# Patient Record
Sex: Female | Born: 1985 | Race: White | Hispanic: No | Marital: Single | Smoking: Current every day smoker
Health system: Southern US, Community
[De-identification: ages and names within clinical notes are randomized; demographics above are authoritative.]

## PROBLEM LIST (undated history)

## (undated) ENCOUNTER — Inpatient Hospital Stay (HOSPITAL_COMMUNITY): Payer: Self-pay

## (undated) DIAGNOSIS — F329 Major depressive disorder, single episode, unspecified: Secondary | ICD-10-CM

## (undated) DIAGNOSIS — L309 Dermatitis, unspecified: Secondary | ICD-10-CM

## (undated) DIAGNOSIS — F32A Depression, unspecified: Secondary | ICD-10-CM

## (undated) DIAGNOSIS — F1120 Opioid dependence, uncomplicated: Secondary | ICD-10-CM

## (undated) DIAGNOSIS — R87629 Unspecified abnormal cytological findings in specimens from vagina: Secondary | ICD-10-CM

## (undated) DIAGNOSIS — O139 Gestational [pregnancy-induced] hypertension without significant proteinuria, unspecified trimester: Secondary | ICD-10-CM

## (undated) DIAGNOSIS — Z22322 Carrier or suspected carrier of Methicillin resistant Staphylococcus aureus: Secondary | ICD-10-CM

## (undated) DIAGNOSIS — F419 Anxiety disorder, unspecified: Secondary | ICD-10-CM

## (undated) DIAGNOSIS — Z87891 Personal history of nicotine dependence: Secondary | ICD-10-CM

## (undated) DIAGNOSIS — Z87442 Personal history of urinary calculi: Secondary | ICD-10-CM

## (undated) DIAGNOSIS — F1290 Cannabis use, unspecified, uncomplicated: Secondary | ICD-10-CM

## (undated) DIAGNOSIS — Z8619 Personal history of other infectious and parasitic diseases: Secondary | ICD-10-CM

## (undated) DIAGNOSIS — O99320 Drug use complicating pregnancy, unspecified trimester: Secondary | ICD-10-CM

## (undated) HISTORY — DX: Dermatitis, unspecified: L30.9

## (undated) HISTORY — DX: Major depressive disorder, single episode, unspecified: F32.9

## (undated) HISTORY — DX: Carrier or suspected carrier of methicillin resistant Staphylococcus aureus: Z22.322

## (undated) HISTORY — PX: WISDOM TOOTH EXTRACTION: SHX21

## (undated) HISTORY — DX: Personal history of other infectious and parasitic diseases: Z86.19

## (undated) HISTORY — DX: Depression, unspecified: F32.A

---
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
## 2008-05-17 ENCOUNTER — Emergency Department (HOSPITAL_BASED_OUTPATIENT_CLINIC_OR_DEPARTMENT_OTHER): Admission: EM | Admit: 2008-05-17 | Discharge: 2008-05-17 | Payer: Self-pay | Admitting: Emergency Medicine

## 2008-10-19 ENCOUNTER — Emergency Department (HOSPITAL_BASED_OUTPATIENT_CLINIC_OR_DEPARTMENT_OTHER): Admission: EM | Admit: 2008-10-19 | Discharge: 2008-10-19 | Payer: Self-pay | Admitting: Emergency Medicine

## 2010-07-03 ENCOUNTER — Emergency Department (HOSPITAL_BASED_OUTPATIENT_CLINIC_OR_DEPARTMENT_OTHER)
Admission: EM | Admit: 2010-07-03 | Discharge: 2010-07-03 | Disposition: A | Discharge status: Home / Self Care | Payer: Self-pay | Attending: Emergency Medicine | Admitting: Emergency Medicine

## 2010-07-03 DIAGNOSIS — F17200 Nicotine dependence, unspecified, uncomplicated: Secondary | ICD-10-CM | POA: Insufficient documentation

## 2010-07-03 DIAGNOSIS — N390 Urinary tract infection, site not specified: Secondary | ICD-10-CM | POA: Insufficient documentation

## 2010-07-03 LAB — URINALYSIS, ROUTINE W REFLEX MICROSCOPIC
Nitrite: NEGATIVE
Specific Gravity, Urine: 1.023 (ref 1.005–1.030)
Urobilinogen, UA: 0.2 mg/dL (ref 0.0–1.0)

## 2010-07-03 LAB — URINE MICROSCOPIC-ADD ON

## 2010-07-03 LAB — PREGNANCY, URINE: Preg Test, Ur: NEGATIVE

## 2010-07-13 LAB — URINALYSIS, ROUTINE W REFLEX MICROSCOPIC
Bilirubin Urine: NEGATIVE
Glucose, UA: NEGATIVE mg/dL
Hgb urine dipstick: NEGATIVE
Ketones, ur: NEGATIVE mg/dL
pH: 7 (ref 5.0–8.0)

## 2010-07-13 LAB — URINE MICROSCOPIC-ADD ON

## 2010-07-22 LAB — URINALYSIS, ROUTINE W REFLEX MICROSCOPIC
Hgb urine dipstick: NEGATIVE
Ketones, ur: NEGATIVE mg/dL
Protein, ur: NEGATIVE mg/dL
Urobilinogen, UA: 1 mg/dL (ref 0.0–1.0)

## 2010-07-22 LAB — URINE MICROSCOPIC-ADD ON

## 2010-12-29 ENCOUNTER — Emergency Department (HOSPITAL_BASED_OUTPATIENT_CLINIC_OR_DEPARTMENT_OTHER)
Admission: EM | Admit: 2010-12-29 | Discharge: 2010-12-29 | Disposition: A | Discharge status: Home / Self Care | Payer: Self-pay | Attending: Emergency Medicine | Admitting: Emergency Medicine

## 2010-12-29 DIAGNOSIS — J069 Acute upper respiratory infection, unspecified: Secondary | ICD-10-CM | POA: Insufficient documentation

## 2010-12-29 DIAGNOSIS — F17200 Nicotine dependence, unspecified, uncomplicated: Secondary | ICD-10-CM | POA: Insufficient documentation

## 2010-12-29 NOTE — ED Provider Notes (Signed)
History    Scribed for No att. providers found, the patient was seen in room MHCT2/MHCT2. This chart was scribed by Katha Cabal. This patient's care was started at 3:42 PM.    CSN: [id number] Arrival date & time: 12/29/2010  3:41 PM  Chief Complaint  Patient presents with  . URI    HPI  (Consider location/radiation/quality/duration/timing/severity/associated sxs/prior treatment)  HPI Olivia Faulkner is a 25 y.o. female who presents to the Emergency Department complaining of gradual worsening of  persistent sore throat and sneezing that began 2 days ago with gradual onset of rhinorrhea, nasal congestion and sneezing, chills  that began yesterday.    Denies N/V/D, dysuria, fever, chest pain, abdominal pain and back pain.   Patient has been around her little brother who is sick also.  Patient works at [employer] and deals with customers often.  Patient has no had a regular menstrual cycle as she is on Depo Provera.   PAST MEDICAL HISTORY:  History reviewed. No pertinent past medical history.  PAST SURGICAL HISTORY:  History reviewed. No pertinent past surgical history.  FAMILY HISTORY:  No family history on file.   SOCIAL HISTORY: History   Social History  . Marital Status: Single    Spouse Name: N/A    Number of Children: N/A  . Years of Education: N/A   Social History Main Topics  . Smoking status: Current Some Day Smoker  . Smokeless tobacco: None  . Alcohol Use: No  . Drug Use: No  . Sexually Active: Yes    Birth Control/ Protection: Injection   Other Topics Concern  . None   Social History Narrative  . None     Review of Systems  Review of Systems 10 Systems reviewed and are negative for acute change except as noted in the HPI.  Allergies  Sulfa antibiotics  Home Medications  No current outpatient prescriptions on file.  Physical Exam    BP 111/68  Pulse 82  Temp 98.3 F (36.8 C)  Resp 16  Ht 5\' 2"  (1.575 m)  Wt 110 lb (49.896 kg)  BMI  20.12 kg/m2  SpO2 100%  Physical Exam  Nursing note and vitals reviewed. Constitutional: She is oriented to person, place, and time. She appears well-developed and well-nourished.  HENT:  Head: Normocephalic and atraumatic.  Right Ear: Tympanic membrane normal.  Left Ear: Tympanic membrane normal.  Mouth/Throat: Oropharynx is clear and moist and mucous membranes are normal. No oropharyngeal exudate, posterior oropharyngeal edema or posterior oropharyngeal erythema.  Eyes: EOM are normal. Pupils are equal, round, and reactive to light.  Neck: Neck supple.  Cardiovascular: Normal rate, regular rhythm and normal heart sounds.   No murmur heard. Pulmonary/Chest: Effort normal and breath sounds normal. No respiratory distress. She has no wheezes. She has no rales.  Abdominal: Soft. There is no tenderness.  Musculoskeletal: Normal range of motion.       No extremity deformity.    Neurological: She is alert and oriented to person, place, and time. No sensory deficit.  Skin: Skin is warm and dry. No rash noted.  Psychiatric: She has a normal mood and affect. Her behavior is normal.    ED Course  Procedures (including critical care time) OTHER DATA REVIEWED: Nursing notes, vital signs, and past medical records reviewed.   DIAGNOSTIC STUDIES: Oxygen Saturation is 100% on room air, normal by my interpretation.     LABS / RADIOLOGY:   No results found.    ED COURSE / COORDINATION  OF CARE: 3:49 PM  Physical Exam complete.   No orders of the defined types were placed in this encounter.    MDM: Throat exam was normal.  No sign of strep throat.  Discussed with patient the use of OTC decongestants and saline sprays for nasal congestion.  Patient requested a note for work as she missed work today.     IMPRESSION: Diagnoses that have been ruled out:  Diagnoses that are still under consideration:  Final diagnoses:  URI (upper respiratory infection)     MEDICATIONS GIVEN IN THE  E.D. Scheduled Meds:   Continuous Infusions:      DISCHARGE MEDICATIONS: New Prescriptions   No medications on file     I personally performed the services described in this documentation, which was scribed in my presence. The recorded information has been reviewed and considered.                 Hilario Quarry, MD 12/29/10 [phone]

## 2010-12-29 NOTE — ED Notes (Signed)
C/o sore thraot, runny nose, sneezing x 2 days-NAD

## 2011-04-07 ENCOUNTER — Emergency Department (HOSPITAL_BASED_OUTPATIENT_CLINIC_OR_DEPARTMENT_OTHER)
Admission: EM | Admit: 2011-04-07 | Discharge: 2011-04-07 | Disposition: A | Discharge status: Home / Self Care | Payer: Self-pay | Attending: Emergency Medicine | Admitting: Emergency Medicine

## 2011-04-07 ENCOUNTER — Encounter (HOSPITAL_BASED_OUTPATIENT_CLINIC_OR_DEPARTMENT_OTHER): Payer: Self-pay | Admitting: Emergency Medicine

## 2011-04-07 DIAGNOSIS — J020 Streptococcal pharyngitis: Secondary | ICD-10-CM | POA: Insufficient documentation

## 2011-04-07 DIAGNOSIS — F17200 Nicotine dependence, unspecified, uncomplicated: Secondary | ICD-10-CM | POA: Insufficient documentation

## 2011-04-07 LAB — RAPID STREP SCREEN (MED CTR MEBANE ONLY): Streptococcus, Group A Screen (Direct): POSITIVE — AB

## 2011-04-07 MED ORDER — SODIUM CHLORIDE 0.9 % IV BOLUS (SEPSIS)
1000.0000 mL | Freq: Once | INTRAVENOUS | Status: AC
  Administered 2011-04-07: 1000 mL via INTRAVENOUS

## 2011-04-07 MED ORDER — IBUPROFEN 400 MG PO TABS
600.0000 mg | ORAL_TABLET | Freq: Once | ORAL | Status: AC
  Administered 2011-04-07: 600 mg via ORAL
  Filled 2011-04-07: qty 1

## 2011-04-07 MED ORDER — AMOXICILLIN 500 MG PO CAPS: 500.0000 mg | ORAL_CAPSULE | Freq: Three times a day (TID) | ORAL | Status: AC

## 2011-04-07 NOTE — ED Notes (Signed)
Sore throat, fever, runny nose, some nausea, no V/D.  No cough, some sob.

## 2011-04-07 NOTE — ED Provider Notes (Signed)
Medical screening examination/treatment/procedure(s) were performed by non-physician practitioner and as supervising physician I was immediately available for consultation/collaboration.     Forbes Cellar, MD 04/07/11 1622

## 2011-04-07 NOTE — ED Provider Notes (Signed)
History     CSN: [id number]  Arrival date & time 04/07/11  1349   First MD Initiated Contact with Patient 04/07/11 1445      Chief Complaint  Patient presents with  . Sore Throat  . Fever    (Consider location/radiation/quality/duration/timing/severity/associated sxs/prior treatment) Patient is a 26 y.o. female presenting with pharyngitis. The history is provided by the patient. No language interpreter was used.  Sore Throat This is a new problem. The current episode started in the past 7 days. The problem occurs constantly. Associated symptoms include a fever. The symptoms are aggravated by swallowing. She has tried nothing for the symptoms.    History reviewed. No pertinent past medical history.  History reviewed. No pertinent past surgical history.  History reviewed. No pertinent family history.  History  Substance Use Topics  . Smoking status: Current Some Day Smoker  . Smokeless tobacco: Not on file  . Alcohol Use: No    OB History    Grav Para Term Preterm Abortions TAB SAB Ect Mult Living                  Review of Systems  Constitutional: Positive for fever.  HENT: Positive for rhinorrhea.   All other systems reviewed and are negative.    Allergies  Sulfa antibiotics  Home Medications   Current Outpatient Rx  Name Route Sig Dispense Refill  . MEDROXYPROGESTERONE ACETATE 150 MG/ML IM SUSP Intramuscular Inject 150 mg into the muscle every 3 (three) months.        BP 110/73  Pulse 137  Temp(Src) 101.4 F (38.6 C) (Oral)  Resp 24  Ht 5\' 2"  (1.575 m)  Wt 110 lb (49.896 kg)  BMI 20.12 kg/m2  SpO2 100%  Physical Exam  Nursing note and vitals reviewed. Constitutional: She is oriented to person, place, and time. She appears well-developed and well-nourished.  HENT:  Head: Normocephalic and atraumatic.  Right Ear: External ear normal.  Left Ear: External ear normal.  Mouth/Throat: Oropharyngeal exudate present.  Eyes: Pupils are equal, round,  and reactive to light.  Neck: Normal range of motion. Neck supple.  Cardiovascular: Normal rate and regular rhythm.   Pulmonary/Chest: Effort normal and breath sounds normal.  Musculoskeletal: Normal range of motion.  Neurological: She is alert and oriented to person, place, and time.    ED Course  Procedures (including critical care time)  Labs Reviewed  RAPID STREP SCREEN - Abnormal; Notable for the following:    Streptococcus, Group A Screen (Direct) POSITIVE (*)    All other components within normal limits   No results found.   1. Strep pharyngitis       MDM  Pt requesting oral abx instead of shot:pt vital stable        Teressa Lower, NP 04/07/11 1620

## 2011-04-10 ENCOUNTER — Ambulatory Visit: Payer: Self-pay | Admitting: Internal Medicine

## 2011-04-16 ENCOUNTER — Ambulatory Visit (INDEPENDENT_AMBULATORY_CARE_PROVIDER_SITE_OTHER): Payer: BC Managed Care – PPO | Admitting: Internal Medicine

## 2011-04-16 ENCOUNTER — Encounter: Payer: Self-pay | Admitting: Internal Medicine

## 2011-04-16 VITALS — BP 100/72 | HR 89 | Temp 98.2°F | Resp 18 | Ht 62.5 in | Wt 94.0 lb

## 2011-04-16 DIAGNOSIS — F419 Anxiety disorder, unspecified: Secondary | ICD-10-CM | POA: Insufficient documentation

## 2011-04-16 DIAGNOSIS — F329 Major depressive disorder, single episode, unspecified: Secondary | ICD-10-CM

## 2011-04-16 DIAGNOSIS — F341 Dysthymic disorder: Secondary | ICD-10-CM

## 2011-04-16 DIAGNOSIS — F411 Generalized anxiety disorder: Secondary | ICD-10-CM | POA: Insufficient documentation

## 2011-04-16 DIAGNOSIS — J020 Streptococcal pharyngitis: Secondary | ICD-10-CM | POA: Insufficient documentation

## 2011-04-16 DIAGNOSIS — F32A Depression, unspecified: Secondary | ICD-10-CM

## 2011-04-16 DIAGNOSIS — R634 Abnormal weight loss: Secondary | ICD-10-CM

## 2011-04-16 HISTORY — PX: NO PAST SURGERIES: SHX2092

## 2011-04-16 LAB — HEPATIC FUNCTION PANEL
ALT: 15 U/L (ref 0–35)
AST: 15 U/L (ref 0–37)
Albumin: 5.2 g/dL (ref 3.5–5.2)
Alkaline Phosphatase: 48 U/L (ref 39–117)
Total Bilirubin: 1 mg/dL (ref 0.3–1.2)
Total Protein: 7.4 g/dL (ref 6.0–8.3)

## 2011-04-16 LAB — CBC WITH DIFFERENTIAL/PLATELET
Basophils Relative: 0 % (ref 0–1)
HCT: 44.3 % (ref 36.0–46.0)
Hemoglobin: 14.9 g/dL (ref 12.0–15.0)
Lymphs Abs: 2.6 10*3/uL (ref 0.7–4.0)
MCHC: 33.6 g/dL (ref 30.0–36.0)
Monocytes Absolute: 0.7 10*3/uL (ref 0.1–1.0)
Monocytes Relative: 6 % (ref 3–12)
Neutro Abs: 9.3 10*3/uL — ABNORMAL HIGH (ref 1.7–7.7)
RBC: 4.81 MIL/uL (ref 3.87–5.11)

## 2011-04-16 LAB — BASIC METABOLIC PANEL
BUN: 13 mg/dL (ref 6–23)
CO2: 26 mEq/L (ref 19–32)
Chloride: 105 mEq/L (ref 96–112)
Glucose, Bld: 84 mg/dL (ref 70–99)
Potassium: 4.8 mEq/L (ref 3.5–5.3)
Sodium: 143 mEq/L (ref 135–145)

## 2011-04-16 LAB — TSH: TSH: 1.918 u[IU]/mL (ref 0.350–4.500)

## 2011-04-16 MED ORDER — CITALOPRAM HYDROBROMIDE 10 MG PO TABS: ORAL_TABLET | ORAL | Status: DC

## 2011-04-16 NOTE — Progress Notes (Signed)
  Subjective:    Patient ID: Olivia Faulkner, female    DOB: Oct 13, 1985, 26 y.o.   MRN: [id number]  HPI Pt presents to clinic for evaluation of anxiety. Notes several year h/o anxiety with intermittent panic attacks. Has mild accompanying depression and insomnia recently going to sleep ~3am and awakening at 8am. Feels like her mind races at night. Recently anxiety worse with life stressors including current relationship with boyfriend. Panic attacks occuring 3x/week sometimes without trigger. Has never undergone medical treatment or counseling. Has lost weight and finds it difficult to gain weight in general. Recently tx'ed at ED 04/07/11 for ST with dx of strep pharyngitis. Two days remaining of amox and sx's significantly improved. [facility] depo provera injxn. Missed last appt but plans to call and follow up. No other complaints.   Past Medical History  Diagnosis Date  . Asthma   . History of chicken pox   . Depression   . Allergic rhinitis   . Migraine    Past Surgical History  Procedure Date  . No past surgeries 04/16/2011    reports that she has been smoking.  She has never used smokeless tobacco. She reports that she drinks alcohol. She reports that she does not use illicit drugs. family history includes Alcohol abuse in her maternal grandfather; Diabetes in her father; Heart disease in her maternal grandmother; Hypertension in her mother; Kidney disease in her maternal grandmother; and Lung cancer in her mother. Allergies  Allergen Reactions  . Sulfa Antibiotics      Review of Systems  Constitutional: Positive for unexpected weight change.  Cardiovascular: Negative for palpitations.  Neurological: Positive for headaches.  Psychiatric/Behavioral: Positive for sleep disturbance and dysphoric mood. The patient is nervous/anxious.   All other systems reviewed and are negative.       Objective:   Physical Exam  Nursing note and vitals  reviewed. Constitutional: She appears well-developed and well-nourished. No distress.  HENT:  Head: Normocephalic and atraumatic.  Right Ear: External ear normal.  Left Ear: External ear normal.  Nose: Nose normal.  Eyes: Conjunctivae are normal. Pupils are equal, round, and reactive to light. No scleral icterus.  Neck: Neck supple. No thyromegaly present.  Cardiovascular: Normal rate, regular rhythm and normal heart sounds.  Exam reveals no gallop and no friction rub.   No murmur heard. Pulmonary/Chest: Effort normal and breath sounds normal. No respiratory distress. She has no wheezes. She has no rales.  Neurological: She is alert.  Skin: Skin is warm and dry. She is not diaphoretic.  Psychiatric: She has a normal mood and affect. Her speech is normal and behavior is normal. Judgment and thought content normal. Her mood appears not anxious. Cognition and memory are normal.          Assessment & Plan:

## 2011-04-16 NOTE — Assessment & Plan Note (Signed)
Improving. Continue abx to completion

## 2011-04-16 NOTE — Assessment & Plan Note (Signed)
Recommend begin low dose ssri with close followup. Obtain TSH. Offered counselor/therapist referral- believes relationship stressor may improve in the near future. Suspect insomnia is secondary.

## 2011-04-16 NOTE — Assessment & Plan Note (Signed)
Obtain tsh, cbc, chem7, lft.

## 2011-05-19 ENCOUNTER — Telehealth: Payer: Self-pay | Admitting: Internal Medicine

## 2011-05-19 ENCOUNTER — Telehealth: Payer: Self-pay | Admitting: *Deleted

## 2011-05-19 ENCOUNTER — Ambulatory Visit (INDEPENDENT_AMBULATORY_CARE_PROVIDER_SITE_OTHER): Payer: BC Managed Care – PPO | Admitting: Internal Medicine

## 2011-05-19 ENCOUNTER — Encounter: Payer: Self-pay | Admitting: Internal Medicine

## 2011-05-19 VITALS — BP 90/64 | HR 89 | Temp 97.9°F | Resp 16 | Ht 62.5 in | Wt 97.0 lb

## 2011-05-19 DIAGNOSIS — J40 Bronchitis, not specified as acute or chronic: Secondary | ICD-10-CM

## 2011-05-19 MED ORDER — DOXYCYCLINE HYCLATE 100 MG PO TABS: 100.0000 mg | ORAL_TABLET | Freq: Two times a day (BID) | ORAL | Status: AC

## 2011-05-19 MED ORDER — AZITHROMYCIN 250 MG PO TABS: ORAL_TABLET | ORAL | Status: DC

## 2011-05-19 NOTE — Progress Notes (Signed)
  Subjective:    Patient ID: Olivia Faulkner, female    DOB: [birth year], 26 y.o.   MRN: [id number]  HPI Pt presents to clinic for evaluation of cough. Notes 1wk h/o cough productive for green sputum without blood, dyspnea or wheeze. Taking otc medication without improvement. No other alleviating or exacerbating factors. No other complaints.  Past Medical History  Diagnosis Date  . Asthma   . History of chicken pox   . Depression   . Allergic rhinitis   . Migraine    Past Surgical History  Procedure Date  . No past surgeries 04/16/2011    reports that she has been smoking.  She has never used smokeless tobacco. She reports that she drinks alcohol. She reports that she does not use illicit drugs. family history includes Alcohol abuse in her maternal grandfather; Diabetes in her father; Heart disease in her maternal grandmother; Hypertension in her mother; Kidney disease in her maternal grandmother; and Lung cancer in her mother. Allergies  Allergen Reactions  . Sulfa Antibiotics      Review of Systems see hpi     Objective:   Physical Exam  Nursing note and vitals reviewed. Constitutional: She appears well-developed and well-nourished. No distress.  HENT:  Head: Normocephalic and atraumatic.  Right Ear: External ear normal.  Left Ear: External ear normal.  Nose: Nose normal.  Mouth/Throat: Oropharynx is clear and moist. No oropharyngeal exudate.  Eyes: Conjunctivae are normal. No scleral icterus.  Neck: Neck supple.  Cardiovascular: Normal rate, regular rhythm and normal heart sounds.   Pulmonary/Chest: Effort normal and breath sounds normal. No respiratory distress. She has no wheezes. She has no rales.  Lymphadenopathy:    She has no cervical adenopathy.  Neurological: She is alert.  Skin: Skin is warm and dry. She is not diaphoretic.  Psychiatric: She has a normal mood and affect.          Assessment & Plan:

## 2011-05-19 NOTE — Telephone Encounter (Signed)
Fax received form [facility] pharmacy stating a interaction with Citalopram and Azithromycin causing  Qt prolongation.  Medication changed to Doxycycline 100 BID  x 7 days per Dr Rodena Medin instructions.

## 2011-05-19 NOTE — Telephone Encounter (Signed)
Patient was seen in the office today.Rx sent electronically to pharmacy.

## 2011-05-24 DIAGNOSIS — J40 Bronchitis, not specified as acute or chronic: Secondary | ICD-10-CM | POA: Insufficient documentation

## 2011-05-24 NOTE — Assessment & Plan Note (Signed)
Attempt abx. Work note provided. Followup if no improvement or worsening.

## 2011-05-25 ENCOUNTER — Ambulatory Visit (INDEPENDENT_AMBULATORY_CARE_PROVIDER_SITE_OTHER): Payer: BC Managed Care – PPO | Admitting: Internal Medicine

## 2011-05-25 ENCOUNTER — Encounter: Payer: Self-pay | Admitting: Internal Medicine

## 2011-05-25 DIAGNOSIS — F419 Anxiety disorder, unspecified: Secondary | ICD-10-CM

## 2011-05-25 DIAGNOSIS — F329 Major depressive disorder, single episode, unspecified: Secondary | ICD-10-CM

## 2011-05-25 DIAGNOSIS — J40 Bronchitis, not specified as acute or chronic: Secondary | ICD-10-CM

## 2011-05-25 DIAGNOSIS — F341 Dysthymic disorder: Secondary | ICD-10-CM

## 2011-05-25 MED ORDER — CITALOPRAM HYDROBROMIDE 20 MG PO TABS: 20.0000 mg | ORAL_TABLET | Freq: Every day | ORAL | Status: DC

## 2011-05-25 NOTE — Assessment & Plan Note (Signed)
Improving. Increase celexa 20mg  po qd. Consider otc valerian or melatonin for sleep

## 2011-05-25 NOTE — Progress Notes (Signed)
  Subjective:    Patient ID: Olivia Faulkner, female    DOB: Aug 20, 1985, 26 y.o.   MRN: [id number]  HPI Pt presents to clinic for followup of multiple medical problems. Bronchitis resolving with a few days of abx's left. Tolerating celexa 10mg  po qd without side effect. Notes less panic attacks- reduced to 1x/wk instead of 3x/wk. Does still have insomnia described as difficulty sustaining sleep. Has attempted otc sleep medication with mixed results. No other complaints.   Past Medical History  Diagnosis Date  . Asthma   . History of chicken pox   . Depression   . Allergic rhinitis   . Migraine    Past Surgical History  Procedure Date  . No past surgeries 04/16/2011    reports that she has been smoking.  She has never used smokeless tobacco. She reports that she drinks alcohol. She reports that she does not use illicit drugs. family history includes Alcohol abuse in her maternal grandfather; Diabetes in her father; Heart disease in her maternal grandmother; Hypertension in her mother; Kidney disease in her maternal grandmother; and Lung cancer in her mother. Allergies  Allergen Reactions  . Sulfa Antibiotics       Review of Systems see hpi     Objective:   Physical Exam  Nursing note and vitals reviewed. Constitutional: She appears well-developed and well-nourished. No distress.  HENT:  Head: Normocephalic and atraumatic.  Neurological: She is alert.  Skin: She is not diaphoretic.  Psychiatric: She has a normal mood and affect.          Assessment & Plan:

## 2011-05-25 NOTE — Patient Instructions (Signed)
You can try over the counter valerian or melatonin for sleep

## 2011-05-25 NOTE — Assessment & Plan Note (Signed)
Resolving. Take abx to completion

## 2012-01-04 ENCOUNTER — Emergency Department (HOSPITAL_BASED_OUTPATIENT_CLINIC_OR_DEPARTMENT_OTHER)
Admission: EM | Admit: 2012-01-04 | Discharge: 2012-01-04 | Disposition: A | Discharge status: Home / Self Care | Payer: Self-pay | Attending: Emergency Medicine | Admitting: Emergency Medicine

## 2012-01-04 ENCOUNTER — Encounter (HOSPITAL_BASED_OUTPATIENT_CLINIC_OR_DEPARTMENT_OTHER): Payer: Self-pay | Admitting: *Deleted

## 2012-01-04 DIAGNOSIS — Z0389 Encounter for observation for other suspected diseases and conditions ruled out: Secondary | ICD-10-CM | POA: Insufficient documentation

## 2012-01-04 NOTE — ED Notes (Signed)
No answer in the waiting room when pt was called to tx room

## 2012-01-04 NOTE — ED Notes (Signed)
No answer in the waiting room when pt was called to tx room x 2.

## 2012-01-04 NOTE — ED Notes (Addendum)
Migraine headache since this am. Slight fever. States she needs a work note before she can come back to work.

## 2012-01-05 ENCOUNTER — Encounter (HOSPITAL_BASED_OUTPATIENT_CLINIC_OR_DEPARTMENT_OTHER): Payer: Self-pay | Admitting: *Deleted

## 2012-01-05 ENCOUNTER — Emergency Department (HOSPITAL_BASED_OUTPATIENT_CLINIC_OR_DEPARTMENT_OTHER)
Admission: EM | Admit: 2012-01-05 | Discharge: 2012-01-05 | Disposition: A | Discharge status: Home / Self Care | Payer: BC Managed Care – PPO | Attending: Emergency Medicine | Admitting: Emergency Medicine

## 2012-01-05 DIAGNOSIS — F3289 Other specified depressive episodes: Secondary | ICD-10-CM | POA: Insufficient documentation

## 2012-01-05 DIAGNOSIS — Z882 Allergy status to sulfonamides status: Secondary | ICD-10-CM | POA: Insufficient documentation

## 2012-01-05 DIAGNOSIS — F329 Major depressive disorder, single episode, unspecified: Secondary | ICD-10-CM | POA: Insufficient documentation

## 2012-01-05 DIAGNOSIS — F17200 Nicotine dependence, unspecified, uncomplicated: Secondary | ICD-10-CM | POA: Insufficient documentation

## 2012-01-05 DIAGNOSIS — Z79899 Other long term (current) drug therapy: Secondary | ICD-10-CM | POA: Insufficient documentation

## 2012-01-05 DIAGNOSIS — R51 Headache: Secondary | ICD-10-CM | POA: Insufficient documentation

## 2012-01-05 MED ORDER — ISOMETHEPTENE-APAP-DICHLORAL 65-325-100 MG PO CAPS: 1.0000 | ORAL_CAPSULE | Freq: Four times a day (QID) | ORAL | Status: DC | PRN

## 2012-01-05 NOTE — ED Provider Notes (Signed)
History     CSN: [id number]  Arrival date & time 01/05/12  1130   First MD Initiated Contact with Patient 01/05/12 1223      Chief Complaint  Patient presents with  . Headache    (Consider location/radiation/quality/duration/timing/severity/associated sxs/prior treatment) Patient is a 26 y.o. female presenting with headaches. The history is provided by the patient. No language interpreter was used.  Headache  This is a new problem. The current episode started yesterday. The problem occurs constantly. The headache is associated with nothing. The quality of the pain is described as sharp. The pain is at a severity of 4/10. The pain is moderate. She has tried acetaminophen for the symptoms. The treatment provided no relief.  Pt reports she does not want a shot.   Pt requesting rx for something for migranes.   Pt reports she has had similar in the past.  Past Medical History  Diagnosis Date  . Asthma   . History of chicken pox   . Depression   . Allergic rhinitis   . Migraine     Past Surgical History  Procedure Date  . No past surgeries 04/16/2011    Family History  Problem Relation Age of Onset  . Alcohol abuse Maternal Grandfather   . Lung cancer Mother   . Heart disease Maternal Grandmother     paternal grandparents  . Hypertension Mother     maternal grandmother  . Kidney disease Maternal Grandmother   . Diabetes Father     History  Substance Use Topics  . Smoking status: Current Every Day Smoker  . Smokeless tobacco: Never Used  . Alcohol Use: Yes    OB History    Grav Para Term Preterm Abortions TAB SAB Ect Mult Living                  Review of Systems  Neurological: Positive for headaches.  All other systems reviewed and are negative.    Allergies  Sulfa antibiotics  Home Medications   Current Outpatient Rx  Name Route Sig Dispense Refill  . CITALOPRAM HYDROBROMIDE 10 MG PO TABS  One half by mouth once a day for 14 days then one a day 30  tablet 6  . CITALOPRAM HYDROBROMIDE 20 MG PO TABS Oral Take 1 tablet (20 mg total) by mouth daily. 30 tablet 6  . ISOMETHEPTENE-APAP-DICHLORAL 65-325-100 MG PO CAPS Oral Take 1 capsule by mouth 4 (four) times daily as needed. 30 capsule 0    BP 118/82  Pulse 82  Temp 98 F (36.7 C) (Oral)  Resp 18  Ht 5\' 2"  (1.575 m)  Wt 100 lb (45.36 kg)  BMI 18.29 kg/m2  SpO2 100%  LMP 12/31/2011  Physical Exam  Nursing note and vitals reviewed. Constitutional: She is oriented to person, place, and time. She appears well-developed and well-nourished.  HENT:  Head: Normocephalic and atraumatic.  Right Ear: External ear normal.  Left Ear: External ear normal.  Nose: Nose normal.  Mouth/Throat: Oropharynx is clear and moist.  Eyes: Conjunctivae normal and EOM are normal. Pupils are equal, round, and reactive to light.  Neck: Normal range of motion. Neck supple.  Cardiovascular: Normal rate and normal heart sounds.   Pulmonary/Chest: Effort normal.  Abdominal: Soft.  Musculoskeletal: Normal range of motion.  Neurological: She is alert and oriented to person, place, and time. She has normal reflexes.  Skin: Skin is warm.  Psychiatric: She has a normal mood and affect.    ED Course  Procedures (including critical care time)  Labs Reviewed - No data to display No results found.   1. Headache       MDM  midrin rx changed to fiorcet pt unable to afford midrin       Elson Areas, [place] 01/05/12 1724

## 2012-01-05 NOTE — ED Notes (Signed)
Pt amb to room 10 with quick steady gait smiling in nad. Pt reports ha x Sunday, states she has had similar ha in the past, and was to have head ct and consult neurologist but her insurance ran out and she could not afford these tests. Denies any other sx, rates ha at 7/10.

## 2012-01-06 NOTE — ED Provider Notes (Signed)
History/physical exam/procedure(s) were performed by non-physician practitioner and as supervising physician I was immediately available for consultation/collaboration. I have reviewed all notes and am in agreement with care and plan.   Hilario Quarry, MD 01/06/12 [phone]

## 2012-04-19 ENCOUNTER — Encounter (HOSPITAL_BASED_OUTPATIENT_CLINIC_OR_DEPARTMENT_OTHER): Payer: Self-pay

## 2012-04-19 ENCOUNTER — Emergency Department (HOSPITAL_BASED_OUTPATIENT_CLINIC_OR_DEPARTMENT_OTHER)
Admission: EM | Admit: 2012-04-19 | Discharge: 2012-04-19 | Disposition: A | Discharge status: Home / Self Care | Payer: Self-pay | Attending: Emergency Medicine | Admitting: Emergency Medicine

## 2012-04-19 DIAGNOSIS — J45909 Unspecified asthma, uncomplicated: Secondary | ICD-10-CM | POA: Insufficient documentation

## 2012-04-19 DIAGNOSIS — Z8679 Personal history of other diseases of the circulatory system: Secondary | ICD-10-CM | POA: Insufficient documentation

## 2012-04-19 DIAGNOSIS — J029 Acute pharyngitis, unspecified: Secondary | ICD-10-CM | POA: Insufficient documentation

## 2012-04-19 DIAGNOSIS — Z8619 Personal history of other infectious and parasitic diseases: Secondary | ICD-10-CM | POA: Insufficient documentation

## 2012-04-19 DIAGNOSIS — F17200 Nicotine dependence, unspecified, uncomplicated: Secondary | ICD-10-CM | POA: Insufficient documentation

## 2012-04-19 DIAGNOSIS — Z8659 Personal history of other mental and behavioral disorders: Secondary | ICD-10-CM | POA: Insufficient documentation

## 2012-04-19 DIAGNOSIS — R509 Fever, unspecified: Secondary | ICD-10-CM | POA: Insufficient documentation

## 2012-04-19 DIAGNOSIS — Z79899 Other long term (current) drug therapy: Secondary | ICD-10-CM | POA: Insufficient documentation

## 2012-04-19 DIAGNOSIS — Z8709 Personal history of other diseases of the respiratory system: Secondary | ICD-10-CM | POA: Insufficient documentation

## 2012-04-19 MED ORDER — DEXAMETHASONE SODIUM PHOSPHATE 10 MG/ML IJ SOLN
10.0000 mg | Freq: Once | INTRAMUSCULAR | Status: AC
  Administered 2012-04-19: 10 mg via INTRAMUSCULAR
  Filled 2012-04-19: qty 1

## 2012-04-19 NOTE — ED Provider Notes (Signed)
Medical screening examination/treatment/procedure(s) were performed by non-physician practitioner and as supervising physician I was immediately available for consultation/collaboration.   Rolan Bucco, MD 04/19/12 1535

## 2012-04-19 NOTE — ED Provider Notes (Signed)
History     CSN: [id number]  Arrival date & time 04/19/12  1435   First MD Initiated Contact with Patient 04/19/12 1444      Chief Complaint  Patient presents with  . Sore Throat  . Fever    (Consider location/radiation/quality/duration/timing/severity/associated sxs/prior treatment) Patient is a 27 y.o. female presenting with pharyngitis. The history is provided by the patient. No language interpreter was used.  Sore Throat This is a new problem. The current episode started yesterday. The problem occurs constantly. Associated symptoms include a fever and a sore throat. Nothing aggravates the symptoms. She has tried nothing for the symptoms.    Past Medical History  Diagnosis Date  . Asthma   . History of chicken pox   . Depression   . Allergic rhinitis   . Migraine     Past Surgical History  Procedure Date  . No past surgeries 04/16/2011    Family History  Problem Relation Age of Onset  . Alcohol abuse Maternal Grandfather   . Lung cancer Mother   . Heart disease Maternal Grandmother     paternal grandparents  . Hypertension Mother     maternal grandmother  . Kidney disease Maternal Grandmother   . Diabetes Father     History  Substance Use Topics  . Smoking status: Current Every Day Smoker  . Smokeless tobacco: Never Used  . Alcohol Use: No    OB History    Grav Para Term Preterm Abortions TAB SAB Ect Mult Living                  Review of Systems  Constitutional: Positive for fever.  HENT: Positive for sore throat.   Respiratory: Negative.   Cardiovascular: Negative.     Allergies  Sulfa antibiotics  Home Medications   Current Outpatient Rx  Name  Route  Sig  Dispense  Refill  . ALBUTEROL IN   Inhalation   Inhale into the lungs.           BP 113/57  Pulse 75  Temp 98.9 F (37.2 C) (Oral)  Resp 16  Ht 5\' 2"  (1.575 m)  Wt 100 lb (45.36 kg)  BMI 18.29 kg/m2  SpO2 100%  LMP 03/19/2012  Physical Exam  Nursing note and vitals  reviewed. Constitutional: She appears well-developed and well-nourished.  HENT:  Head: Normocephalic and atraumatic.  Right Ear: External ear normal.  Left Ear: External ear normal.  Mouth/Throat: Posterior oropharyngeal erythema present.  Eyes: Conjunctivae normal are normal.  Cardiovascular: Normal rate and regular rhythm.   Pulmonary/Chest: Effort normal and breath sounds normal.  Neurological: She is alert.  Skin: Skin is warm and dry.    ED Course  Procedures (including critical care time)   Labs Reviewed  RAPID STREP SCREEN   No results found.   1. Pharyngitis       MDM  Pt given decadron for symptomatic relief       Teressa Lower, NP 04/19/12 [phone]

## 2012-04-19 NOTE — ED Notes (Signed)
Pt reports sore throat and fever that started yesterday.  

## 2012-05-10 ENCOUNTER — Encounter (HOSPITAL_BASED_OUTPATIENT_CLINIC_OR_DEPARTMENT_OTHER): Payer: Self-pay

## 2012-05-10 ENCOUNTER — Emergency Department (HOSPITAL_BASED_OUTPATIENT_CLINIC_OR_DEPARTMENT_OTHER)
Admission: EM | Admit: 2012-05-10 | Discharge: 2012-05-10 | Disposition: A | Discharge status: Home / Self Care | Payer: Self-pay | Attending: Emergency Medicine | Admitting: Emergency Medicine

## 2012-05-10 DIAGNOSIS — Z8619 Personal history of other infectious and parasitic diseases: Secondary | ICD-10-CM | POA: Insufficient documentation

## 2012-05-10 DIAGNOSIS — K0889 Other specified disorders of teeth and supporting structures: Secondary | ICD-10-CM

## 2012-05-10 DIAGNOSIS — Z79899 Other long term (current) drug therapy: Secondary | ICD-10-CM | POA: Insufficient documentation

## 2012-05-10 DIAGNOSIS — J45909 Unspecified asthma, uncomplicated: Secondary | ICD-10-CM | POA: Insufficient documentation

## 2012-05-10 DIAGNOSIS — F17200 Nicotine dependence, unspecified, uncomplicated: Secondary | ICD-10-CM | POA: Insufficient documentation

## 2012-05-10 DIAGNOSIS — J309 Allergic rhinitis, unspecified: Secondary | ICD-10-CM | POA: Insufficient documentation

## 2012-05-10 DIAGNOSIS — K089 Disorder of teeth and supporting structures, unspecified: Secondary | ICD-10-CM | POA: Insufficient documentation

## 2012-05-10 DIAGNOSIS — Z8679 Personal history of other diseases of the circulatory system: Secondary | ICD-10-CM | POA: Insufficient documentation

## 2012-05-10 DIAGNOSIS — Z8659 Personal history of other mental and behavioral disorders: Secondary | ICD-10-CM | POA: Insufficient documentation

## 2012-05-10 MED ORDER — HYDROCODONE-ACETAMINOPHEN 5-325 MG PO TABS
2.0000 | ORAL_TABLET | Freq: Once | ORAL | Status: AC
  Administered 2012-05-10: 2 via ORAL
  Filled 2012-05-10: qty 2

## 2012-05-10 MED ORDER — AMOXICILLIN 500 MG PO CAPS: 500.0000 mg | ORAL_CAPSULE | Freq: Three times a day (TID) | ORAL | Status: DC

## 2012-05-10 MED ORDER — HYDROCODONE-ACETAMINOPHEN 5-325 MG PO TABS: 2.0000 | ORAL_TABLET | ORAL | Status: AC | PRN

## 2012-05-10 NOTE — ED Notes (Signed)
Pt also given number for [employer].

## 2012-05-10 NOTE — ED Provider Notes (Signed)
History     CSN: [id number]  Arrival date & time 05/10/12  1826   First MD Initiated Contact with Patient 05/10/12 1932      Chief Complaint  Patient presents with  . Dental Pain    (Consider location/radiation/quality/duration/timing/severity/associated sxs/prior treatment) Patient is a 27 y.o. female presenting with tooth pain. The history is provided by the patient. No language interpreter was used.  Dental PainThe primary symptoms include mouth pain. The symptoms began more than 1 month ago. The symptoms are worsening. The symptoms occur constantly.  Additional symptoms include: gum swelling and gum tenderness. Medical issues include: smoking.    Past Medical History  Diagnosis Date  . Asthma   . History of chicken pox   . Depression   . Allergic rhinitis   . Migraine     Past Surgical History  Procedure Date  . No past surgeries 04/16/2011    Family History  Problem Relation Age of Onset  . Alcohol abuse Maternal Grandfather   . Lung cancer Mother   . Heart disease Maternal Grandmother     paternal grandparents  . Hypertension Mother     maternal grandmother  . Kidney disease Maternal Grandmother   . Diabetes Father     History  Substance Use Topics  . Smoking status: Current Every Day Smoker -- 0.5 packs/day    Types: Cigarettes  . Smokeless tobacco: Never Used  . Alcohol Use: No    OB History    Grav Para Term Preterm Abortions TAB SAB Ect Mult Living                  Review of Systems  HENT: Positive for dental problem.   All other systems reviewed and are negative.    Allergies  Sulfa antibiotics  Home Medications   Current Outpatient Rx  Name  Route  Sig  Dispense  Refill  . ALBUTEROL IN   Inhalation   Inhale into the lungs.           BP 106/56  Pulse 56  Temp 98.4 F (36.9 C) (Oral)  Resp 18  Ht 5\' 2"  (1.575 m)  Wt 105 lb (47.628 kg)  BMI 19.20 kg/m2  SpO2 100%  LMP 04/19/2012  Physical Exam  Nursing note and vitals  reviewed. Constitutional: She is oriented to person, place, and time. She appears well-developed and well-nourished.  HENT:  Head: Normocephalic and atraumatic.  Right Ear: External ear normal.  Left Ear: External ear normal.  Eyes: Pupils are equal, round, and reactive to light.  Neck: Normal range of motion.  Cardiovascular: Normal rate.   Pulmonary/Chest: Effort normal.  Musculoskeletal: Normal range of motion.  Neurological: She is alert and oriented to person, place, and time. She has normal reflexes.  Skin: Skin is warm.  Psychiatric: She has a normal mood and affect.    ED Course  Procedures (including critical care time)  Labs Reviewed - No data to display No results found.   No diagnosis found.    MDM  pcn vk and hydrocodone.  Pt advised to call dr. Lucky Cowboy to be seen for evaluation        Lonia Skinner [place], [place] 05/10/12 [id number]

## 2012-05-10 NOTE — ED Notes (Signed)
Pt rpeorts dental pain x 1 month unrelieved after taking Ibuprofen.

## 2012-05-10 NOTE — ED Provider Notes (Signed)
Medical screening examination/treatment/procedure(s) were performed by non-physician practitioner and as supervising physician I was immediately available for consultation/collaboration.    Celene Kras, MD 05/10/12 [phone]

## 2013-02-13 ENCOUNTER — Encounter (HOSPITAL_BASED_OUTPATIENT_CLINIC_OR_DEPARTMENT_OTHER): Payer: Self-pay | Admitting: Emergency Medicine

## 2013-02-13 ENCOUNTER — Emergency Department (HOSPITAL_BASED_OUTPATIENT_CLINIC_OR_DEPARTMENT_OTHER)
Admission: EM | Admit: 2013-02-13 | Discharge: 2013-02-13 | Disposition: A | Discharge status: Home / Self Care | Payer: Self-pay | Attending: Emergency Medicine | Admitting: Emergency Medicine

## 2013-02-13 DIAGNOSIS — A084 Viral intestinal infection, unspecified: Secondary | ICD-10-CM

## 2013-02-13 DIAGNOSIS — A088 Other specified intestinal infections: Secondary | ICD-10-CM | POA: Insufficient documentation

## 2013-02-13 DIAGNOSIS — Z8679 Personal history of other diseases of the circulatory system: Secondary | ICD-10-CM | POA: Insufficient documentation

## 2013-02-13 DIAGNOSIS — Z8619 Personal history of other infectious and parasitic diseases: Secondary | ICD-10-CM | POA: Insufficient documentation

## 2013-02-13 DIAGNOSIS — F17200 Nicotine dependence, unspecified, uncomplicated: Secondary | ICD-10-CM | POA: Insufficient documentation

## 2013-02-13 DIAGNOSIS — Z8659 Personal history of other mental and behavioral disorders: Secondary | ICD-10-CM | POA: Insufficient documentation

## 2013-02-13 DIAGNOSIS — J45909 Unspecified asthma, uncomplicated: Secondary | ICD-10-CM | POA: Insufficient documentation

## 2013-02-13 DIAGNOSIS — Z79899 Other long term (current) drug therapy: Secondary | ICD-10-CM | POA: Insufficient documentation

## 2013-02-13 NOTE — ED Provider Notes (Signed)
CSN: [id number]     Arrival date & time 02/13/13  1041 History   First MD Initiated Contact with Patient 02/13/13 1127     Chief Complaint  Patient presents with  . Nausea  . Emesis  . Diarrhea   (Consider location/radiation/quality/duration/timing/severity/associated sxs/prior Treatment) Patient is a 27 y.o. female presenting with vomiting and diarrhea. The history is provided by the patient.  Emesis Severity:  Mild Duration:  24 hours Timing:  Intermittent Number of daily episodes:  5 Quality:  Stomach contents (non-bilious, non-bloody) Able to tolerate:  Liquids and solids Progression:  Partially resolved Chronicity:  New Recent urination:  Normal Context: not post-tussive and not self-induced   Relieved by:  Nothing Worsened by:  Nothing tried Ineffective treatments: pepto bismol. Associated symptoms: diarrhea and fever   Associated symptoms: no abdominal pain, no arthralgias, no chills, no cough, no headaches, no myalgias, no sore throat and no URI   Diarrhea:    Quality:  Mucous and watery   Severity:  Mild   Duration:  24 hours   Timing:  Intermittent   Progression:  Improving Fever:    Duration:  24 hours   Timing:  Intermittent   Max temp PTA (F):  100.6   Temp source:  Oral   Progression:  Resolved Risk factors: sick contacts   Risk factors: no alcohol use, no diabetes, not pregnant now, no suspect food intake and no travel to endemic areas   Diarrhea Associated symptoms: vomiting   Associated symptoms: no abdominal pain, no arthralgias, no chills, no recent cough, no headaches, no myalgias and no URI      Past Medical History  Diagnosis Date  . Asthma   . History of chicken pox   . Depression   . Allergic rhinitis   . Migraine    Past Surgical History  Procedure Laterality Date  . No past surgeries  04/16/2011   Family History  Problem Relation Age of Onset  . Alcohol abuse Maternal Grandfather   . Lung cancer Mother   . Heart disease Maternal  Grandmother     paternal grandparents  . Hypertension Mother     maternal grandmother  . Kidney disease Maternal Grandmother   . Diabetes Father    History  Substance Use Topics  . Smoking status: Current Every Day Smoker -- 0.50 packs/day    Types: Cigarettes  . Smokeless tobacco: Never Used  . Alcohol Use: No   OB History   Grav Para Term Preterm Abortions TAB SAB Ect Mult Living                 Review of Systems  Constitutional: Negative for chills.  HENT: Negative for sore throat.   Gastrointestinal: Positive for vomiting and diarrhea. Negative for abdominal pain.  Musculoskeletal: Negative for arthralgias and myalgias.  Neurological: Negative for headaches.    Allergies  Sulfa antibiotics  Home Medications   Current Outpatient Rx  Name  Route  Sig  Dispense  Refill  . ALBUTEROL IN   Inhalation   Inhale into the lungs.          BP 107/65  Pulse 84  Temp(Src) 98.6 F (37 C) (Oral)  Resp 20  Ht 5\' 2"  (1.575 m)  Wt 100 lb (45.36 kg)  BMI 18.29 kg/m2  SpO2 99% Physical Exam  Constitutional: She is oriented to person, place, and time. She appears well-developed and well-nourished. No distress.  HENT:  Head: Normocephalic.  Mouth/Throat: Oropharynx is clear and moist.  No oropharyngeal exudate.  Eyes: Conjunctivae and EOM are normal. Pupils are equal, round, and reactive to light.  Cardiovascular: Normal rate, regular rhythm, normal heart sounds and intact distal pulses.  Exam reveals no gallop and no friction rub.   No murmur heard. Pulmonary/Chest: Effort normal and breath sounds normal. No respiratory distress. She has no wheezes. She has no rales. She exhibits no tenderness.  Abdominal: Soft. Bowel sounds are normal. She exhibits no distension and no mass. There is no tenderness. There is no rebound and no guarding.  Neurological: She is alert and oriented to person, place, and time.  Skin: She is not diaphoretic.  Psychiatric: She has a normal mood and  affect. Her behavior is normal. Judgment and thought content normal.    ED Course  Procedures (including critical care time) Labs Review Labs Reviewed - No data to display Imaging Review No results found.  EKG Interpretation   None       MDM  No diagnosis found.  1. Viral Gastroenteritis The patients vomiting diarrhea and fever appear to be 2/2 to a viral gastroenteritis. Admits to multiple sick contacts at work. Patients states that she is feeling better today and was been able to tolerate fluids and food without problems. Pepto bismal made her symptoms worse. The patient denies recent travel to endemic areas of Ebola. Denies expsoure to be known to have ebola. I reassured the patient and recommended that the patient be discharged to home. Encouraged PO intake of food and fluids as tolerated. I recommended that the patient return to work when she is symptom free.     Pleas Koch, MD 02/13/13 1212

## 2013-02-13 NOTE — ED Notes (Signed)
N/V/D

## 2013-02-14 NOTE — ED Provider Notes (Signed)
I saw and evaluated the patient, reviewed the resident's note and I agree with the findings and plan.   .Face to face Exam:  General:  Awake HEENT:  Atraumatic Resp:  Normal effort Abd:  Nondistended Neuro:No focal weakness  Nelia Shi, MD 02/14/13 2314

## 2013-04-01 ENCOUNTER — Emergency Department (HOSPITAL_BASED_OUTPATIENT_CLINIC_OR_DEPARTMENT_OTHER)
Admission: EM | Admit: 2013-04-01 | Discharge: 2013-04-01 | Disposition: A | Discharge status: Home / Self Care | Payer: Self-pay | Attending: Emergency Medicine | Admitting: Emergency Medicine

## 2013-04-01 ENCOUNTER — Encounter (HOSPITAL_BASED_OUTPATIENT_CLINIC_OR_DEPARTMENT_OTHER): Payer: Self-pay | Admitting: Emergency Medicine

## 2013-04-01 DIAGNOSIS — Z8659 Personal history of other mental and behavioral disorders: Secondary | ICD-10-CM | POA: Insufficient documentation

## 2013-04-01 DIAGNOSIS — J029 Acute pharyngitis, unspecified: Secondary | ICD-10-CM | POA: Insufficient documentation

## 2013-04-01 DIAGNOSIS — J45909 Unspecified asthma, uncomplicated: Secondary | ICD-10-CM | POA: Insufficient documentation

## 2013-04-01 DIAGNOSIS — Z8679 Personal history of other diseases of the circulatory system: Secondary | ICD-10-CM | POA: Insufficient documentation

## 2013-04-01 DIAGNOSIS — R1110 Vomiting, unspecified: Secondary | ICD-10-CM

## 2013-04-01 DIAGNOSIS — F17200 Nicotine dependence, unspecified, uncomplicated: Secondary | ICD-10-CM | POA: Insufficient documentation

## 2013-04-01 DIAGNOSIS — Z8619 Personal history of other infectious and parasitic diseases: Secondary | ICD-10-CM | POA: Insufficient documentation

## 2013-04-01 DIAGNOSIS — Z9109 Other allergy status, other than to drugs and biological substances: Secondary | ICD-10-CM | POA: Insufficient documentation

## 2013-04-01 DIAGNOSIS — R509 Fever, unspecified: Secondary | ICD-10-CM | POA: Insufficient documentation

## 2013-04-01 DIAGNOSIS — J069 Acute upper respiratory infection, unspecified: Secondary | ICD-10-CM | POA: Insufficient documentation

## 2013-04-01 DIAGNOSIS — R112 Nausea with vomiting, unspecified: Secondary | ICD-10-CM | POA: Insufficient documentation

## 2013-04-01 DIAGNOSIS — Z79899 Other long term (current) drug therapy: Secondary | ICD-10-CM | POA: Insufficient documentation

## 2013-04-01 MED ORDER — PROMETHAZINE HCL 25 MG PO TABS: 25.0000 mg | ORAL_TABLET | Freq: Four times a day (QID) | ORAL | Status: DC | PRN

## 2013-04-01 NOTE — ED Provider Notes (Signed)
CSN: [id number]     Arrival date & time 04/01/13  6213 History   First MD Initiated Contact with Patient 04/01/13 0818     Chief Complaint  Patient presents with  . Nasal Congestion  . Emesis   (Consider location/radiation/quality/duration/timing/severity/associated sxs/prior Treatment) HPI Comments: Asian is a 27 year old female patient is a 27 year old female otherwise healthy who presents with complaints of cough congestion and sore throat. She also reports nausea and vomiting several times since yesterday morning. She has felt fever but has not checked her temperature. She reports others at work ill in a similar fashion.  Patient is a 27 y.o. female presenting with vomiting. The history is provided by the patient.  Emesis Severity:  Moderate Duration:  2 days Timing:  Constant Quality:  Stomach contents Progression:  Worsening Chronicity:  New Recent urination:  Normal Relieved by:  Nothing Worsened by:  Nothing tried Ineffective treatments:  None tried Associated symptoms: chills, fever, sore throat and URI   Associated symptoms: no abdominal pain     Past Medical History  Diagnosis Date  . Asthma   . History of chicken pox   . Depression   . Allergic rhinitis   . Migraine    Past Surgical History  Procedure Laterality Date  . No past surgeries  04/16/2011   Family History  Problem Relation Age of Onset  . Alcohol abuse Maternal Grandfather   . Lung cancer Mother   . Heart disease Maternal Grandmother     paternal grandparents  . Hypertension Mother     maternal grandmother  . Kidney disease Maternal Grandmother   . Diabetes Father    History  Substance Use Topics  . Smoking status: Current Every Day Smoker -- 0.50 packs/day    Types: Cigarettes  . Smokeless tobacco: Never Used  . Alcohol Use: No   OB History   Grav Para Term Preterm Abortions TAB SAB Ect Mult Living                 Review of Systems  Constitutional: Positive for chills.  HENT:  Positive for sore throat.   Gastrointestinal: Positive for vomiting. Negative for abdominal pain.  All other systems reviewed and are negative.    Allergies  Sulfa antibiotics  Home Medications   Current Outpatient Rx  Name  Route  Sig  Dispense  Refill  . ALBUTEROL IN   Inhalation   Inhale into the lungs.          BP 118/70  Pulse 78  Temp(Src) 98.2 F (36.8 C) (Oral)  Resp 18  Ht 5\' 2"  (1.575 m)  Wt 110 lb (49.896 kg)  BMI 20.11 kg/m2  SpO2 99%  LMP 03/02/2013 Physical Exam  Nursing note and vitals reviewed. Constitutional: She is oriented to person, place, and time. She appears well-developed and well-nourished. No distress.  HENT:  Head: Normocephalic and atraumatic.  Bilateral TMs are clear. The posterior oropharynx is mildly erythematous without exudates or significant tonsillar hypertrophy.  Neck: Normal range of motion. Neck supple.  Cardiovascular: Normal rate and regular rhythm.  Exam reveals no gallop and no friction rub.   No murmur heard. Pulmonary/Chest: Effort normal and breath sounds normal. No respiratory distress. She has no wheezes.  Abdominal: Soft. Bowel sounds are normal. She exhibits no distension. There is no tenderness.  Musculoskeletal: Normal range of motion.  Neurological: She is alert and oriented to person, place, and time.  Skin: Skin is warm and dry. She is not diaphoretic.  ED Course  Procedures (including critical care time) Labs Review Labs Reviewed  RAPID STREP SCREEN   Imaging Review No results found.  EKG Interpretation   None       MDM  No diagnosis found. Symptoms seem viral in nature. Strep test is negative. Will discharge with Phenergan, fluids, Tylenol and Motrin and when necessary followup.    Geoffery Lyons, MD 04/01/13 [phone]

## 2013-04-01 NOTE — ED Notes (Signed)
Pt states she has nasal congestion, general aches and pains, sore throat, cough, N/V (4 times day).  No diarrhea. Able to keep some fluids down.

## 2013-04-03 LAB — CULTURE, GROUP A STREP

## 2014-06-04 ENCOUNTER — Telehealth: Payer: Self-pay

## 2014-06-04 NOTE — Telephone Encounter (Signed)
Invalid number

## 2014-06-06 ENCOUNTER — Encounter: Payer: Self-pay | Admitting: Physician Assistant

## 2014-06-06 ENCOUNTER — Ambulatory Visit (INDEPENDENT_AMBULATORY_CARE_PROVIDER_SITE_OTHER): Payer: 59 | Admitting: Physician Assistant

## 2014-06-06 VITALS — BP 112/76 | HR 81 | Temp 98.2°F | Resp 16 | Ht 62.0 in | Wt 124.4 lb

## 2014-06-06 DIAGNOSIS — L309 Dermatitis, unspecified: Secondary | ICD-10-CM | POA: Insufficient documentation

## 2014-06-06 DIAGNOSIS — B9689 Other specified bacterial agents as the cause of diseases classified elsewhere: Secondary | ICD-10-CM | POA: Insufficient documentation

## 2014-06-06 DIAGNOSIS — J4530 Mild persistent asthma, uncomplicated: Secondary | ICD-10-CM

## 2014-06-06 DIAGNOSIS — J45909 Unspecified asthma, uncomplicated: Secondary | ICD-10-CM | POA: Insufficient documentation

## 2014-06-06 DIAGNOSIS — J019 Acute sinusitis, unspecified: Secondary | ICD-10-CM

## 2014-06-06 MED ORDER — TRIAMCINOLONE 0.1 % CREAM:EUCERIN CREAM 1:1: 1.0000 "application " | TOPICAL_CREAM | Freq: Two times a day (BID) | CUTANEOUS | Status: DC

## 2014-06-06 MED ORDER — AMOXICILLIN-POT CLAVULANATE 875-125 MG PO TABS: 1.0000 | ORAL_TABLET | Freq: Two times a day (BID) | ORAL | Status: DC

## 2014-06-06 NOTE — Patient Instructions (Signed)
For you eczema -- please get the steroid/moisturizing cream filled.  Apply twice daily with one of these applications being right after you get out of the shower.  This will help the medicine absorb better into your skin.    For the allergies -- add on daily Flonase to your regimen. Please clean your bedroom very well and do not allow the pets in there.  This is the cause of your worsened nighttime symptoms and wheezing.   Please take antibiotic as directed.  Increase fluid intake.  Use Saline nasal spray.  Take a daily multivitamin. Use Mucinex-DM for cough and congestion.  Place a humidifier in the bedroom.    Follow-up in 3-4 weeks.  Sinusitis Sinusitis is redness, soreness, and swelling (inflammation) of the paranasal sinuses. Paranasal sinuses are air pockets within the bones of your face (beneath the eyes, the middle of the forehead, or above the eyes). In healthy paranasal sinuses, mucus is able to drain out, and air is able to circulate through them by way of your nose. However, when your paranasal sinuses are inflamed, mucus and air can become trapped. This can allow bacteria and other germs to grow and cause infection. Sinusitis can develop quickly and last only a short time (acute) or continue over a long period (chronic). Sinusitis that lasts for more than 12 weeks is considered chronic.  CAUSES  Causes of sinusitis include:  Allergies.  Structural abnormalities, such as displacement of the cartilage that separates your nostrils (deviated septum), which can decrease the air flow through your nose and sinuses and affect sinus drainage.  Functional abnormalities, such as when the small hairs (cilia) that line your sinuses and help remove mucus do not work properly or are not present. SYMPTOMS  Symptoms of acute and chronic sinusitis are the same. The primary symptoms are pain and pressure around the affected sinuses. Other symptoms include:  Upper  toothache.  Earache.  Headache.  Bad breath.  Decreased sense of smell and taste.  A cough, which worsens when you are lying flat.  Fatigue.  Fever.  Thick drainage from your nose, which often is green and may contain pus (purulent).  Swelling and warmth over the affected sinuses. DIAGNOSIS  Your caregiver will perform a physical exam. During the exam, your caregiver may:  Look in your nose for signs of abnormal growths in your nostrils (nasal polyps).  Tap over the affected sinus to check for signs of infection.  View the inside of your sinuses (endoscopy) with a special imaging device with a light attached (endoscope), which is inserted into your sinuses. If your caregiver suspects that you have chronic sinusitis, one or more of the following tests may be recommended:  Allergy tests.  Nasal culture A sample of mucus is taken from your nose and sent to a lab and screened for bacteria.  Nasal cytology A sample of mucus is taken from your nose and examined by your caregiver to determine if your sinusitis is related to an allergy. TREATMENT  Most cases of acute sinusitis are related to a viral infection and will resolve on their own within 10 days. Sometimes medicines are prescribed to help relieve symptoms (pain medicine, decongestants, nasal steroid sprays, or saline sprays).  However, for sinusitis related to a bacterial infection, your caregiver will prescribe antibiotic medicines. These are medicines that will help kill the bacteria causing the infection.  Rarely, sinusitis is caused by a fungal infection. In theses cases, your caregiver will prescribe antifungal medicine. For some  cases of chronic sinusitis, surgery is needed. Generally, these are cases in which sinusitis recurs more than 3 times per year, despite other treatments. HOME CARE INSTRUCTIONS   Drink plenty of water. Water helps thin the mucus so your sinuses can drain more easily.  Use a  humidifier.  Inhale steam 3 to 4 times a day (for example, sit in the bathroom with the shower running).  Apply a warm, moist washcloth to your face 3 to 4 times a day, or as directed by your caregiver.  Use saline nasal sprays to help moisten and clean your sinuses.  Take over-the-counter or prescription medicines for pain, discomfort, or fever only as directed by your caregiver. SEEK IMMEDIATE MEDICAL CARE IF:  You have increasing pain or severe headaches.  You have nausea, vomiting, or drowsiness.  You have swelling around your face.  You have vision problems.  You have a stiff neck.  You have difficulty breathing. MAKE SURE YOU:   Understand these instructions.  Will watch your condition.  Will get help right away if you are not doing well or get worse. Document Released: 03/23/2005 Document Revised: 06/15/2011 Document Reviewed: 04/07/2011 [facility] Patient Information 2014 [employer], [place].

## 2014-06-06 NOTE — Progress Notes (Signed)
Patient presents to clinic today to establish care.  Acute Concerns: Patient complains of 2 weeks of sinus pressure, sinus pain, ear pressure, ST and dry cough.  Endorses tooth pain and ear pain.  Denies fever, chills or aches.  Denies recent travel or sick contact.  Chronic Issues: Patient complains of uncontrolled eczema that has been present since youth, but has really flared up this year.  Does have pets in the home that sleep in her room.  Patient endorses nighttime wheezing and chest tightness.  Has history of asthma, requiring albuterol inhaler most nights of the week.   Past Medical History  Diagnosis Date  . Asthma   . History of chicken pox   . Depression   . Allergic rhinitis   . Migraine   . Eczema   . MRSA (methicillin resistant staph aureus) culture positive     Past Surgical History  Procedure Laterality Date  . No past surgeries  04/16/2011    No current outpatient prescriptions on file prior to visit.   No current facility-administered medications on file prior to visit.    Allergies  Allergen Reactions  . Sulfa Antibiotics     Family History  Problem Relation Age of Onset  . Alcohol abuse Maternal Grandfather   . Lung cancer Mother     Living  . Heart disease Maternal Grandmother   . Hypertension Mother   . Kidney disease Maternal Grandmother   . Diabetes Father     Living  . COPD Mother   . Hypertension Maternal Grandmother   . Hypertension Father   . Skin cancer Father   . Lung cancer Maternal Grandmother   . Heart disease Paternal Grandmother   . Heart disease Paternal Grandfather   . Alcohol abuse Maternal Aunt   . COPD Maternal Aunt   . Alcohol abuse Brother   . Asthma Brother     History   Social History  . Marital Status: Single    Spouse Name: N/A  . Number of Children: N/A  . Years of Education: N/A   Occupational History  . Not on file.   Social History Main Topics  . Smoking status: Current Every Day Smoker --  0.50 packs/day    Types: Cigarettes  . Smokeless tobacco: Never Used  . Alcohol Use: No  . Drug Use: No  . Sexual Activity: Yes    Birth Control/ Protection: Injection   Other Topics Concern  . Not on file   Social History Narrative   ROS Pertinent ROS are listed in the HPI.  BP 112/76 mmHg  Pulse 81  Temp(Src) 98.2 F (36.8 C) (Oral)  Resp 16  Ht  (1.575 m)  Wt 124 lb 6 oz (56.416 kg)  BMI 22.74 kg/m2  SpO2 97%  LMP 05/16/2014  Physical Exam  Constitutional: She is oriented to person, place, and time and well-developed, well-nourished, and in no distress.  HENT:  Head: Normocephalic and atraumatic.  Right Ear: Tympanic membrane, external ear and ear canal normal.  Left Ear: Tympanic membrane, external ear and ear canal normal.  Nose: Mucosal edema and rhinorrhea present. Right sinus exhibits frontal sinus tenderness. Right sinus exhibits no maxillary sinus tenderness. Left sinus exhibits frontal sinus tenderness. Left sinus exhibits no maxillary sinus tenderness.  Mouth/Throat: Uvula is midline, oropharynx is clear and moist and mucous membranes are normal.  Eyes: Conjunctivae are normal.  Neck: Neck supple. No thyromegaly present.  Cardiovascular: Normal rate, regular rhythm, normal heart sounds and  intact distal pulses.   Pulmonary/Chest: Effort normal and breath sounds normal. No respiratory distress. She has no wheezes. She has no rales. She exhibits no tenderness.  Lymphadenopathy:    She has no cervical adenopathy.  Neurological: She is alert and oriented to person, place, and time.  Skin: Skin is warm and dry.  Eczematous patches noted of bilateral upper and lower extremities, most prominent on hands bilaterally.  Psychiatric: Affect normal.  Vitals reviewed.  Assessment/Plan: Acute bacterial sinusitis Rx Augmentin.  Increase fluids.  Rest.  Saline nasal spray.  Probiotic.  Mucinex as directed.  Humidifier in bedroom.  Call or return to clinic if symptoms  are not improving.    Allergic asthma Patient instructed to clean her bedroom and keep her pets out of that area as this is most likely culprit of her nighttime breathing symptoms.  Continue albuterol and place humidifier in bedroom.  Call if no improvement with these changes as we will need to add on an ICS.   Eczema Will begin Triamcinolone 0.1%: Eucerin 1:1 cream to apply twice daily.  Encouraged emollients daily.  Humidifier in bedroom.  Continue daily Claaritin.  Sarna lotion and cool compresses may also be beneficial.  Follow-up in 3-4 weeks.

## 2014-06-06 NOTE — Assessment & Plan Note (Signed)
Will begin Triamcinolone 0.1%: Eucerin 1:1 cream to apply twice daily.  Encouraged emollients daily.  Humidifier in bedroom.  Continue daily Claaritin.  Sarna lotion and cool compresses may also be beneficial.  Follow-up in 3-4 weeks.

## 2014-06-06 NOTE — Assessment & Plan Note (Signed)
Rx Augmentin.  Increase fluids.  Rest.  Saline nasal spray.  Probiotic.  Mucinex as directed.  Humidifier in bedroom.  Call or return to clinic if symptoms are not improving.  

## 2014-06-06 NOTE — Assessment & Plan Note (Signed)
Patient instructed to clean her bedroom and keep her pets out of that area as this is most likely culprit of her nighttime breathing symptoms.  Continue albuterol and place humidifier in bedroom.  Call if no improvement with these changes as we will need to add on an ICS.

## 2014-06-07 ENCOUNTER — Telehealth: Payer: Self-pay | Admitting: Physician Assistant

## 2014-06-07 NOTE — Telephone Encounter (Signed)
emmi emailed °

## 2014-06-27 ENCOUNTER — Ambulatory Visit: Payer: 59 | Admitting: Physician Assistant

## 2014-07-03 ENCOUNTER — Ambulatory Visit: Payer: 59 | Admitting: Physician Assistant

## 2014-07-04 ENCOUNTER — Ambulatory Visit: Payer: 59 | Admitting: Physician Assistant

## 2014-07-11 ENCOUNTER — Encounter: Payer: Self-pay | Admitting: Physician Assistant

## 2014-07-11 ENCOUNTER — Telehealth: Payer: Self-pay | Admitting: Physician Assistant

## 2014-07-11 NOTE — Telephone Encounter (Signed)
Pt was no show for follow up appt on 07/04/14- 2 prior cancellations- letter sent- charge?

## 2014-07-11 NOTE — Telephone Encounter (Signed)
Charge. 

## 2014-10-22 LAB — CYSTIC FIBROSIS DIAGNOSTIC STUDY: Interpretation-CFDNA:: NEGATIVE

## 2014-10-22 LAB — OB RESULTS CONSOLE GC/CHLAMYDIA
CHLAMYDIA, DNA PROBE: NEGATIVE
Gonorrhea: NEGATIVE

## 2014-10-22 LAB — OB RESULTS CONSOLE VARICELLA ZOSTER ANTIBODY, IGG: Varicella: IMMUNE

## 2014-10-22 LAB — OB RESULTS CONSOLE HGB/HCT, BLOOD
HEMATOCRIT: 38 %
Hemoglobin: 12.7 g/dL

## 2014-10-22 LAB — CYTOLOGY - PAP
Glucose, GTT - 1 Hour: 94 mg/dL (ref ?–200)
URINE CULTURE, OB: NEGATIVE

## 2014-10-22 LAB — OB RESULTS CONSOLE RPR: RPR: NONREACTIVE

## 2014-10-22 LAB — OB RESULTS CONSOLE ABO/RH: RH Type: POSITIVE

## 2014-10-22 LAB — OB RESULTS CONSOLE ANTIBODY SCREEN: Antibody Screen: NEGATIVE

## 2014-10-22 LAB — OB RESULTS CONSOLE HEPATITIS B SURFACE ANTIGEN: Hepatitis B Surface Ag: NEGATIVE

## 2014-10-22 LAB — OB RESULTS CONSOLE RUBELLA ANTIBODY, IGM: Rubella: IMMUNE

## 2014-10-23 ENCOUNTER — Encounter: Payer: Self-pay | Admitting: Obstetrics & Gynecology

## 2014-11-05 DIAGNOSIS — O99322 Drug use complicating pregnancy, second trimester: Principal | ICD-10-CM

## 2014-11-05 DIAGNOSIS — F1120 Opioid dependence, uncomplicated: Secondary | ICD-10-CM

## 2014-11-08 ENCOUNTER — Encounter: Payer: Self-pay | Admitting: Obstetrics & Gynecology

## 2014-11-08 ENCOUNTER — Ambulatory Visit (INDEPENDENT_AMBULATORY_CARE_PROVIDER_SITE_OTHER): Payer: Self-pay | Admitting: Obstetrics & Gynecology

## 2014-11-08 VITALS — BP 113/71 | HR 69 | Temp 98.7°F | Wt 105.9 lb

## 2014-11-08 DIAGNOSIS — O0992 Supervision of high risk pregnancy, unspecified, second trimester: Secondary | ICD-10-CM

## 2014-11-08 DIAGNOSIS — F1120 Opioid dependence, uncomplicated: Secondary | ICD-10-CM | POA: Insufficient documentation

## 2014-11-08 DIAGNOSIS — R87619 Unspecified abnormal cytological findings in specimens from cervix uteri: Secondary | ICD-10-CM | POA: Insufficient documentation

## 2014-11-08 DIAGNOSIS — R87610 Atypical squamous cells of undetermined significance on cytologic smear of cervix (ASC-US): Secondary | ICD-10-CM

## 2014-11-08 DIAGNOSIS — O0990 Supervision of high risk pregnancy, unspecified, unspecified trimester: Secondary | ICD-10-CM | POA: Insufficient documentation

## 2014-11-08 DIAGNOSIS — O99320 Drug use complicating pregnancy, unspecified trimester: Secondary | ICD-10-CM | POA: Insufficient documentation

## 2014-11-08 LAB — POCT URINALYSIS DIP (DEVICE)
Bilirubin Urine: NEGATIVE
Glucose, UA: NEGATIVE mg/dL
HGB URINE DIPSTICK: NEGATIVE
Ketones, ur: NEGATIVE mg/dL
Nitrite: NEGATIVE
PH: 7 (ref 5.0–8.0)
Protein, ur: NEGATIVE mg/dL
Specific Gravity, Urine: 1.02 (ref 1.005–1.030)
Urobilinogen, UA: 0.2 mg/dL (ref 0.0–1.0)

## 2014-11-08 NOTE — Patient Instructions (Signed)
Second Trimester of Pregnancy The second trimester is from week 13 through week 28, months 4 through 6. The second trimester is often a time when you feel your best. Your body has also adjusted to being pregnant, and you begin to feel better physically. Usually, morning sickness has lessened or quit completely, you may have more energy, and you may have an increase in appetite. The second trimester is also a time when the fetus is growing rapidly. At the end of the sixth month, the fetus is about 9 inches long and weighs about 1 pounds. You will likely begin to feel the baby move (quickening) between 18 and 20 weeks of the pregnancy. BODY CHANGES Your body goes through many changes during pregnancy. The changes vary from woman to woman.   Your weight will continue to increase. You will notice your lower abdomen bulging out.  You may begin to get stretch marks on your hips, abdomen, and breasts.  You may develop headaches that can be relieved by medicines approved by your health care provider.  You may urinate more often because the fetus is pressing on your bladder.  You may develop or continue to have heartburn as a result of your pregnancy.  You may develop constipation because certain hormones are causing the muscles that push waste through your intestines to slow down.  You may develop hemorrhoids or swollen, bulging veins (varicose veins).  You may have back pain because of the weight gain and pregnancy hormones relaxing your joints between the bones in your pelvis and as a result of a shift in weight and the muscles that support your balance.  Your breasts will continue to grow and be tender.  Your gums may bleed and may be sensitive to brushing and flossing.  Dark spots or blotches (chloasma, mask of pregnancy) may develop on your face. This will likely fade after the baby is born.  A dark line from your belly button to the pubic area (linea nigra) may appear. This will likely fade  after the baby is born.  You may have changes in your hair. These can include thickening of your hair, rapid growth, and changes in texture. Some women also have hair loss during or after pregnancy, or hair that feels dry or thin. Your hair will most likely return to normal after your baby is born. WHAT TO EXPECT AT YOUR PRENATAL VISITS During a routine prenatal visit:  You will be weighed to make sure you and the fetus are growing normally.  Your blood pressure will be taken.  Your abdomen will be measured to track your baby's growth.  The fetal heartbeat will be listened to.  Any test results from the previous visit will be discussed. Your health care provider may ask you:  How you are feeling.  If you are feeling the baby move.  If you have had any abnormal symptoms, such as leaking fluid, bleeding, severe headaches, or abdominal cramping.  If you have any questions. Other tests that may be performed during your second trimester include:  Blood tests that check for:  Low iron levels (anemia).  Gestational diabetes (between 24 and 28 weeks).  Rh antibodies.  Urine tests to check for infections, diabetes, or protein in the urine.  An ultrasound to confirm the proper growth and development of the baby.  An amniocentesis to check for possible genetic problems.  Fetal screens for spina bifida and Down syndrome. HOME CARE INSTRUCTIONS   Avoid all smoking, herbs, alcohol, and unprescribed   drugs. These chemicals affect the formation and growth of the baby.  Follow your health care provider's instructions regarding medicine use. There are medicines that are either safe or unsafe to take during pregnancy.  Exercise only as directed by your health care provider. Experiencing uterine cramps is a good sign to stop exercising.  Continue to eat regular, healthy meals.  Wear a good support bra for breast tenderness.  Do not use hot tubs, steam rooms, or saunas.  Wear your  seat belt at all times when driving.  Avoid raw meat, uncooked cheese, cat litter boxes, and soil used by cats. These carry germs that can cause birth defects in the baby.  Take your prenatal vitamins.  Try taking a stool softener (if your health care provider approves) if you develop constipation. Eat more high-fiber foods, such as fresh vegetables or fruit and whole grains. Drink plenty of fluids to keep your urine clear or pale yellow.  Take warm sitz baths to soothe any pain or discomfort caused by hemorrhoids. Use hemorrhoid cream if your health care provider approves.  If you develop varicose veins, wear support hose. Elevate your feet for 15 minutes, 3-4 times a day. Limit salt in your diet.  Avoid heavy lifting, wear low heel shoes, and practice good posture.  Rest with your legs elevated if you have leg cramps or low back pain.  Visit your dentist if you have not gone yet during your pregnancy. Use a soft toothbrush to brush your teeth and be gentle when you floss.  A sexual relationship may be continued unless your health care provider directs you otherwise.  Continue to go to all your prenatal visits as directed by your health care provider. SEEK MEDICAL CARE IF:   You have dizziness.  You have mild pelvic cramps, pelvic pressure, or nagging pain in the abdominal area.  You have persistent nausea, vomiting, or diarrhea.  You have a bad smelling vaginal discharge.  You have pain with urination. SEEK IMMEDIATE MEDICAL CARE IF:   You have a fever.  You are leaking fluid from your vagina.  You have spotting or bleeding from your vagina.  You have severe abdominal cramping or pain.  You have rapid weight gain or loss.  You have shortness of breath with chest pain.  You notice sudden or extreme swelling of your face, hands, ankles, feet, or legs.  You have not felt your baby move in over an hour.  You have severe headaches that do not go away with  medicine.  You have vision changes. Document Released: 03/17/2001 Document Revised: 03/28/2013 Document Reviewed: 05/24/2012 ExitCare Patient Information 2015 [employer]. This information is not intended to replace advice given to you by your health care provider. Make sure you discuss any questions you have with your health care provider.  

## 2014-11-08 NOTE — Progress Notes (Signed)
NOB, OB box done and note by MS reviewed and patient seen and examined by me. Suboxone taper is planned. Transfer from [facility]

## 2014-11-08 NOTE — Progress Notes (Signed)
Subjective:    Olivia Faulkner is a G2P0010  by LMP 07/18/14 being seen today to establish care in this OB office-- initial OB visit at [facility], where she had pelvic exam and blood work done (records have been requested).  Her obstetrical history is significant for suboxone use during this pregnancy.   She is being seen in [facility] regarding tapering her suboxone over the next two weeks.  Patient does intend to breast feed. Pregnancy history fully reviewed, patient states that she was not taking prenatal vitamins prior to conception but began to take them when she found out she was pregnant on June 30th.  Also stopped smoking June 30th.  Denies any drug/alcohol use.  No history of IV drug use. Gyn history notable only for prior diagnosis of chlamydia 3-4 years ago that was treated.  1 female sexual partner (father of baby) in past 12 months, does not use condoms.   Patient reports no bleeding and no leaking. No cramping.  Did have some N/V earlier in pregnancy, but this resolved about a month ago and appetite has been "great" since then.  She estimates she has gained about 4 lbs so far during this pregnancy.  Filed Vitals:   11/08/14 0857  BP: 113/71  Pulse: 69  Temp: 98.7 F (37.1 C)  Weight: 105 lb 14.4 oz (48.036 kg)   HISTORY: OB History  Gravida Para Term Preterm AB SAB TAB Ectopic Multiple Living  # Outcome Date GA Lbr Len/2nd Weight Sex Delivery Anes PTL Lv  2 Current           1 SAB 2009 [redacted]w[redacted]d            Past Medical History  Diagnosis Date  . Asthma   . History of chicken pox   . Depression   . Allergic rhinitis   . Migraine   . Eczema   . MRSA (methicillin resistant staph aureus) culture positive    Past Surgical History  Procedure Laterality Date  . No past surgeries  04/16/2011   Family History  Problem Relation Age of Onset  . Alcohol abuse Maternal Grandfather   . Lung cancer Mother     Living  . Heart disease Maternal Grandmother   .  Hypertension Mother   . Kidney disease Maternal Grandmother   . Diabetes Father     Living  . COPD Mother   . Hypertension Maternal Grandmother   . Hypertension Father   . Skin cancer Father   . Lung cancer Maternal Grandmother   . Heart disease Paternal Grandmother   . Heart disease Paternal Grandfather   . Alcohol abuse Maternal Aunt   . COPD Maternal Aunt   . Alcohol abuse Brother   . Asthma Brother    Exam   Uterus:    Firm, felt approximately 4cm below umbilicus  Pelvic Exam: Deferred                              System: Breast:  normal appearance, no masses or tenderness   Skin: Dry flaking skin noted on back c/w patient's report of eczema, pink scattered macules on abdomen    Neurologic: oriented, normal mood   Extremities: no deformities, gait was normal for age   HEENT sclera clear, anicteric   Mouth/Teeth mucous membranes moist, pharynx normal without lesions   Neck  supple   Cardiovascular: regular rate and rhythm, no murmurs or gallops   Respiratory:  appears well, vitals normal, no respiratory distress, acyanotic, normal RR, ear and throat exam is normal, chest clear, no wheezing, crepitations, rhonchi, normal symmetric air entry   Abdomen: soft, non-tender; bowel sounds normal; no masses,  no organomegaly   Urinary: deferred   Fetal Heart Rate on exam was 175 by ultrasound  Assessment:    Pregnancy: G2P0010 Patient Active Problem List   Diagnosis Date Noted  . 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 Supervision of high risk pregnancy, antepartum 11/08/2014  . Eczema 06/06/2014  . Acute bacterial sinusitis 06/06/2014  . Allergic asthma 06/06/2014  . Anxiety and depression 04/16/2011     Plan:  Olivia Faulkner is a 29 y/o G20010 with hx significant for suboxone use during this pregnancy who is establishing care in [facility].  Initial labs and exam were done at [facility] department and records are being requested.   Well-appearing at this time with FHR at 175.    Today: #Nutrition Consult #Social Work Consult #Schedule 20wk ultrasound  Continue prenatal vitamins.  No genetic screening tests ordered at this visit, will wait until dates are confirmed with ultrasound. Problem list reviewed and updated.    50% of 30 min visit spent on counseling and coordination of care.    The above information was documented with assistance from Retta Mac, MS3.    Retta Mac Medical Student 11/08/2014     11/08/2014

## 2014-11-08 NOTE — Progress Notes (Signed)
Nutrition Note; 1 st visit.  Wt gain < expected.  Pt reports N/V and decreased appetite earlier in pregnancy with initial wt loss.  This has now resolved. Diet appears adequate.  Reports 3 meals and 2-3 snacks/d. Underwt prior to pregnancy.  Discussed importance of 25-30# wt gain throughout pregnancy. PNV daily. Plans to breastfeed. Certified for [facility] today. F/U as needed. Candice C. Earlene Plater, MPH, RD, LDN

## 2014-11-08 NOTE — Progress Notes (Signed)
Anatomy ultrasound scheduled for 11/21/2014@11 :00AM

## 2014-11-08 NOTE — Progress Notes (Signed)
Here for first visit. Given new patient information information.

## 2014-11-13 ENCOUNTER — Other Ambulatory Visit: Payer: Self-pay | Admitting: Obstetrics & Gynecology

## 2014-11-13 DIAGNOSIS — O99320 Drug use complicating pregnancy, unspecified trimester: Secondary | ICD-10-CM

## 2014-11-13 DIAGNOSIS — Z1389 Encounter for screening for other disorder: Secondary | ICD-10-CM

## 2014-11-13 DIAGNOSIS — Z3A18 18 weeks gestation of pregnancy: Secondary | ICD-10-CM

## 2014-11-13 DIAGNOSIS — O0992 Supervision of high risk pregnancy, unspecified, second trimester: Secondary | ICD-10-CM

## 2014-11-13 DIAGNOSIS — F1120 Opioid dependence, uncomplicated: Secondary | ICD-10-CM

## 2014-11-21 ENCOUNTER — Other Ambulatory Visit: Payer: Self-pay | Admitting: Obstetrics & Gynecology

## 2014-11-21 ENCOUNTER — Ambulatory Visit (HOSPITAL_COMMUNITY)
Admission: RE | Admit: 2014-11-21 | Discharge: 2014-11-21 | Disposition: A | Discharge status: Home / Self Care | Payer: Medicaid Other | Source: Ambulatory Visit | Attending: Obstetrics & Gynecology | Admitting: Obstetrics & Gynecology

## 2014-11-21 ENCOUNTER — Encounter (HOSPITAL_COMMUNITY): Payer: Self-pay

## 2014-11-21 VITALS — BP 111/57 | HR 77 | Wt 107.0 lb

## 2014-11-21 DIAGNOSIS — O26841 Uterine size-date discrepancy, first trimester: Secondary | ICD-10-CM

## 2014-11-21 DIAGNOSIS — Z36 Encounter for antenatal screening of mother: Secondary | ICD-10-CM | POA: Diagnosis not present

## 2014-11-21 DIAGNOSIS — O0992 Supervision of high risk pregnancy, unspecified, second trimester: Secondary | ICD-10-CM

## 2014-11-21 DIAGNOSIS — O99320 Drug use complicating pregnancy, unspecified trimester: Secondary | ICD-10-CM | POA: Insufficient documentation

## 2014-11-21 DIAGNOSIS — O99322 Drug use complicating pregnancy, second trimester: Secondary | ICD-10-CM

## 2014-11-21 DIAGNOSIS — Z3682 Encounter for antenatal screening for nuchal translucency: Secondary | ICD-10-CM

## 2014-11-21 DIAGNOSIS — F1120 Opioid dependence, uncomplicated: Secondary | ICD-10-CM | POA: Diagnosis not present

## 2014-11-21 DIAGNOSIS — Z3A18 18 weeks gestation of pregnancy: Secondary | ICD-10-CM

## 2014-11-21 DIAGNOSIS — Z3A13 13 weeks gestation of pregnancy: Secondary | ICD-10-CM | POA: Insufficient documentation

## 2014-11-21 DIAGNOSIS — Z1389 Encounter for screening for other disorder: Secondary | ICD-10-CM

## 2014-11-22 ENCOUNTER — Encounter: Payer: Self-pay | Admitting: Family Medicine

## 2014-11-22 NOTE — Telephone Encounter (Signed)
Telephone call to patient regarding mychart message. Per Dr Erin Fulling patient needs repeat pap to assess hpv status. Called patient and discussed information with her. Patient verbalized understanding and had no questions

## 2014-12-06 ENCOUNTER — Ambulatory Visit (INDEPENDENT_AMBULATORY_CARE_PROVIDER_SITE_OTHER): Payer: Medicaid Other | Admitting: Family Medicine

## 2014-12-06 ENCOUNTER — Encounter: Payer: Self-pay | Admitting: Family Medicine

## 2014-12-06 VITALS — BP 97/50 | HR 67 | Temp 98.3°F | Wt 110.3 lb

## 2014-12-06 DIAGNOSIS — Z23 Encounter for immunization: Secondary | ICD-10-CM | POA: Diagnosis not present

## 2014-12-06 DIAGNOSIS — O0992 Supervision of high risk pregnancy, unspecified, second trimester: Secondary | ICD-10-CM

## 2014-12-06 DIAGNOSIS — O99322 Drug use complicating pregnancy, second trimester: Secondary | ICD-10-CM

## 2014-12-06 DIAGNOSIS — F1120 Opioid dependence, uncomplicated: Secondary | ICD-10-CM

## 2014-12-06 DIAGNOSIS — R87610 Atypical squamous cells of undetermined significance on cytologic smear of cervix (ASC-US): Secondary | ICD-10-CM | POA: Diagnosis not present

## 2014-12-06 DIAGNOSIS — F418 Other specified anxiety disorders: Secondary | ICD-10-CM

## 2014-12-06 DIAGNOSIS — F32A Depression, unspecified: Secondary | ICD-10-CM

## 2014-12-06 DIAGNOSIS — O99320 Drug use complicating pregnancy, unspecified trimester: Secondary | ICD-10-CM

## 2014-12-06 DIAGNOSIS — F1121 Opioid dependence, in remission: Secondary | ICD-10-CM | POA: Diagnosis not present

## 2014-12-06 DIAGNOSIS — F329 Major depressive disorder, single episode, unspecified: Secondary | ICD-10-CM

## 2014-12-06 DIAGNOSIS — F419 Anxiety disorder, unspecified: Secondary | ICD-10-CM

## 2014-12-06 LAB — POCT URINALYSIS DIP (DEVICE)
Bilirubin Urine: NEGATIVE
Glucose, UA: NEGATIVE mg/dL
HGB URINE DIPSTICK: NEGATIVE
Ketones, ur: NEGATIVE mg/dL
NITRITE: NEGATIVE
PH: 7.5 (ref 5.0–8.0)
PROTEIN: NEGATIVE mg/dL
SPECIFIC GRAVITY, URINE: 1.02 (ref 1.005–1.030)
UROBILINOGEN UA: 1 mg/dL (ref 0.0–1.0)

## 2014-12-06 MED ORDER — ALBUTEROL SULFATE HFA 108 (90 BASE) MCG/ACT IN AERS: 1.0000 | INHALATION_SPRAY | Freq: Four times a day (QID) | RESPIRATORY_TRACT | Status: DC | PRN

## 2014-12-06 MED ORDER — PRENATAL VITAMINS PLUS 27-1 MG PO TABS: 1.0000 | ORAL_TABLET | Freq: Every day | ORAL | Status: DC

## 2014-12-06 NOTE — Progress Notes (Signed)
Subjective:  Olivia Faulkner is a 29 y.o. G2P0010 at [redacted]w[redacted]d being seen today for ongoing prenatal care.  Patient reports rash on bottom of foot.  Contractions: Not present.  Vag. Bleeding: None.  . Denies leaking of fluid.   Reports itching, esp bottom of foot. Rash present. Has tried benadryl and lotrimin.   The following portions of the patient's history were reviewed and updated as appropriate: allergies, current medications, past family history, past medical history, past social history, past surgical history and problem list.   Objective:   Filed Vitals:   12/06/14 1101  BP: 97/50  Pulse: 67  Temp: 98.3 F (36.8 C)  Weight: 110 lb 4.8 oz (50.032 kg)    Fetal Status: Fetal Heart Rate (bpm): 151         General:  Alert, oriented and cooperative. Patient is in no acute distress.  Skin: Skin is warm and dry. No rash noted.   Cardiovascular: Normal heart rate noted  Respiratory: Normal respiratory effort, no problems with respiration noted  Abdomen: Soft, gravid, appropriate for gestational age. Pain/Pressure: Absent     Pelvic: Vag. Bleeding: None Vag D/C Character: White   Cervical exam deferred        Extremities: Normal range of motion.  Edema: None  Mental Status: Normal mood and affect. Normal behavior. Normal judgment and thought content.   Urinalysis: Urine Protein: Negative Urine Glucose: Negative  Assessment and Plan:  Pregnancy: G2P0010 at [redacted]w[redacted]d  1. Drug use affecting pregnancy On suboxone, tapering off.   3. Supervision of high risk pregnancy, antepartum, second trimester - Updated pregnancy box - Needs pap with HPV - Prenatal Vit-Fe Fumarate-FA (PRENATAL VITAMINS PLUS) 27-1 MG TABS; Take 1 tablet by mouth daily.  Dispense: 30 tablet; Refill: 11 - albuterol (PROVENTIL HFA;VENTOLIN HFA) 108 (90 BASE) MCG/ACT inhaler; Inhale 1-2 puffs into the lungs every 6 (six) hours as needed for wheezing or shortness of breath.  Dispense: 1 Inhaler; Refill: 0  4. Anxiety and  depression Doing well  5. 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  6. Atypical squamous cells of undetermined significance on cytologic smear of cervix (ASC-US) Pap next visit  7. Rash on foot- appears to be eczema vs fungal but since not responding to antifungal will try steroid. In 2 weeks if not improved, consider Rx for topical antifungal.   Preterm labor symptoms and general obstetric precautions including but not limited to vaginal bleeding, contractions, leaking of fluid and fetal movement were reviewed in detail with the patient. Please refer to After Visit Summary for other counseling recommendations.  Return in about 4 weeks (around 01/03/2015) for Routine prenatal care.   Federico Flake, MD

## 2014-12-06 NOTE — Addendum Note (Signed)
Addended by: Geanie Berlin on: 12/06/2014 11:29 AM   Modules accepted: Orders

## 2014-12-06 NOTE — Progress Notes (Signed)
Reviewed tip of week with patient  

## 2014-12-06 NOTE — Patient Instructions (Signed)
Second Trimester of Pregnancy The second trimester is from week 13 through week 28, months 4 through 6. The second trimester is often a time when you feel your best. Your body has also adjusted to being pregnant, and you begin to feel better physically. Usually, morning sickness has lessened or quit completely, you may have more energy, and you may have an increase in appetite. The second trimester is also a time when the fetus is growing rapidly. At the end of the sixth month, the fetus is about 9 inches long and weighs about 1 pounds. You will likely begin to feel the baby move (quickening) between 18 and 20 weeks of the pregnancy. BODY CHANGES Your body goes through many changes during pregnancy. The changes vary from woman to woman.   Your weight will continue to increase. You will notice your lower abdomen bulging out.  You may begin to get stretch marks on your hips, abdomen, and breasts.  You may develop headaches that can be relieved by medicines approved by your health care provider.  You may urinate more often because the fetus is pressing on your bladder.  You may develop or continue to have heartburn as a result of your pregnancy.  You may develop constipation because certain hormones are causing the muscles that push waste through your intestines to slow down.  You may develop hemorrhoids or swollen, bulging veins (varicose veins).  You may have back pain because of the weight gain and pregnancy hormones relaxing your joints between the bones in your pelvis and as a result of a shift in weight and the muscles that support your balance.  Your breasts will continue to grow and be tender.  Your gums may bleed and may be sensitive to brushing and flossing.  Dark spots or blotches (chloasma, mask of pregnancy) may develop on your face. This will likely fade after the baby is born.  A dark line from your belly button to the pubic area (linea nigra) may appear. This will likely fade  after the baby is born.  You may have changes in your hair. These can include thickening of your hair, rapid growth, and changes in texture. Some women also have hair loss during or after pregnancy, or hair that feels dry or thin. Your hair will most likely return to normal after your baby is born. WHAT TO EXPECT AT YOUR PRENATAL VISITS During a routine prenatal visit:  You will be weighed to make sure you and the fetus are growing normally.  Your blood pressure will be taken.  Your abdomen will be measured to track your baby's growth.  The fetal heartbeat will be listened to.  Any test results from the previous visit will be discussed. Your health care provider may ask you:  How you are feeling.  If you are feeling the baby move.  If you have had any abnormal symptoms, such as leaking fluid, bleeding, severe headaches, or abdominal cramping.  If you have any questions. Other tests that may be performed during your second trimester include:  Blood tests that check for:  Low iron levels (anemia).  Gestational diabetes (between 24 and 28 weeks).  Rh antibodies.  Urine tests to check for infections, diabetes, or protein in the urine.  An ultrasound to confirm the proper growth and development of the baby.  An amniocentesis to check for possible genetic problems.  Fetal screens for spina bifida and Down syndrome. HOME CARE INSTRUCTIONS   Avoid all smoking, herbs, alcohol, and unprescribed   drugs. These chemicals affect the formation and growth of the baby.  Follow your health care provider's instructions regarding medicine use. There are medicines that are either safe or unsafe to take during pregnancy.  Exercise only as directed by your health care provider. Experiencing uterine cramps is a good sign to stop exercising.  Continue to eat regular, healthy meals.  Wear a good support bra for breast tenderness.  Do not use hot tubs, steam rooms, or saunas.  Wear your  seat belt at all times when driving.  Avoid raw meat, uncooked cheese, cat litter boxes, and soil used by cats. These carry germs that can cause birth defects in the baby.  Take your prenatal vitamins.  Try taking a stool softener (if your health care provider approves) if you develop constipation. Eat more high-fiber foods, such as fresh vegetables or fruit and whole grains. Drink plenty of fluids to keep your urine clear or pale yellow.  Take warm sitz baths to soothe any pain or discomfort caused by hemorrhoids. Use hemorrhoid cream if your health care provider approves.  If you develop varicose veins, wear support hose. Elevate your feet for 15 minutes, 3-4 times a day. Limit salt in your diet.  Avoid heavy lifting, wear low heel shoes, and practice good posture.  Rest with your legs elevated if you have leg cramps or low back pain.  Visit your dentist if you have not gone yet during your pregnancy. Use a soft toothbrush to brush your teeth and be gentle when you floss.  A sexual relationship may be continued unless your health care provider directs you otherwise.  Continue to go to all your prenatal visits as directed by your health care provider. SEEK MEDICAL CARE IF:   You have dizziness.  You have mild pelvic cramps, pelvic pressure, or nagging pain in the abdominal area.  You have persistent nausea, vomiting, or diarrhea.  You have a bad smelling vaginal discharge.  You have pain with urination. SEEK IMMEDIATE MEDICAL CARE IF:   You have a fever.  You are leaking fluid from your vagina.  You have spotting or bleeding from your vagina.  You have severe abdominal cramping or pain.  You have rapid weight gain or loss.  You have shortness of breath with chest pain.  You notice sudden or extreme swelling of your face, hands, ankles, feet, or legs.  You have not felt your baby move in over an hour.  You have severe headaches that do not go away with  medicine.  You have vision changes. Document Released: 03/17/2001 Document Revised: 03/28/2013 Document Reviewed: 05/24/2012 ExitCare Patient Information 2015 [employer]. This information is not intended to replace advice given to you by your health care provider. Make sure you discuss any questions you have with your health care provider.  

## 2014-12-12 LAB — OPIATES/OPIOIDS (LC/MS-MS)
CODEINE URINE: NEGATIVE ng/mL (ref <50)
Hydrocodone: NEGATIVE ng/mL (ref <50)
Hydromorphone: NEGATIVE ng/mL (ref <50)
MORPHINE: NEGATIVE ng/mL (ref <50)
Norhydrocodone, Ur: NEGATIVE ng/mL (ref <50)
Noroxycodone, Ur: NEGATIVE ng/mL (ref <50)
OXYMORPHONE, URINE: NEGATIVE ng/mL (ref <50)
Oxycodone, ur: NEGATIVE ng/mL (ref <50)

## 2014-12-12 LAB — BUPRENORPHINES (GC/LC/MS), URINE
BUPRENORPHINE (GC/LC/MS): 65.1 ng/mL — AB (ref <2)
NORBUPRENORPHINE: 636.3 ng/mL — AB (ref <2)

## 2014-12-13 LAB — PRESCRIPTION MONITORING PROFILE (19 PANEL)
Amphetamine/Meth: NEGATIVE ng/mL
BENZODIAZEPINE SCREEN, URINE: NEGATIVE ng/mL
Barbiturate Screen, Urine: NEGATIVE ng/mL
CANNABINOID SCRN UR: NEGATIVE ng/mL
COCAINE METABOLITES: NEGATIVE ng/mL
CREATININE, URINE: 161.12 mg/dL (ref >20.0)
Carisoprodol, Urine: NEGATIVE ng/mL
ECSTASY: NEGATIVE ng/mL
FENTANYL URINE: NEGATIVE ng/mL
Meperidine, Ur: NEGATIVE ng/mL
Methadone Screen, Urine: NEGATIVE ng/mL
Methaqualone: NEGATIVE ng/mL
Nitrites, Initial: NEGATIVE ug/mL
OXYCODONE SCRN UR: NEGATIVE ng/mL
PH URINE, INITIAL: 7.8 pH (ref 4.5–8.9)
Phencyclidine, Ur: NEGATIVE ng/mL
Propoxyphene: NEGATIVE ng/mL
TRAMADOL UR: NEGATIVE ng/mL
Tapentadol, urine: NEGATIVE ng/mL
ZOLPIDEM, URINE: NEGATIVE ng/mL

## 2014-12-26 ENCOUNTER — Ambulatory Visit (HOSPITAL_COMMUNITY)
Admission: RE | Admit: 2014-12-26 | Discharge: 2014-12-26 | Disposition: A | Discharge status: Home / Self Care | Payer: Medicaid Other | Source: Ambulatory Visit | Attending: Maternal and Fetal Medicine | Admitting: Maternal and Fetal Medicine

## 2014-12-26 ENCOUNTER — Other Ambulatory Visit (HOSPITAL_COMMUNITY): Payer: Self-pay | Admitting: *Deleted

## 2014-12-26 DIAGNOSIS — F1120 Opioid dependence, uncomplicated: Secondary | ICD-10-CM | POA: Insufficient documentation

## 2014-12-26 DIAGNOSIS — F1121 Opioid dependence, in remission: Secondary | ICD-10-CM | POA: Insufficient documentation

## 2014-12-26 DIAGNOSIS — O99322 Drug use complicating pregnancy, second trimester: Secondary | ICD-10-CM | POA: Insufficient documentation

## 2014-12-26 DIAGNOSIS — F1920 Other psychoactive substance dependence, uncomplicated: Secondary | ICD-10-CM

## 2015-01-03 ENCOUNTER — Ambulatory Visit (INDEPENDENT_AMBULATORY_CARE_PROVIDER_SITE_OTHER): Payer: Medicaid Other | Admitting: Obstetrics & Gynecology

## 2015-01-03 VITALS — BP 113/63 | HR 72 | Temp 97.9°F | Wt 114.4 lb

## 2015-01-03 DIAGNOSIS — L309 Dermatitis, unspecified: Secondary | ICD-10-CM

## 2015-01-03 DIAGNOSIS — Z3482 Encounter for supervision of other normal pregnancy, second trimester: Secondary | ICD-10-CM | POA: Diagnosis not present

## 2015-01-03 LAB — POCT URINALYSIS DIP (DEVICE)
BILIRUBIN URINE: NEGATIVE
GLUCOSE, UA: NEGATIVE mg/dL
HGB URINE DIPSTICK: NEGATIVE
KETONES UR: NEGATIVE mg/dL
LEUKOCYTES UA: NEGATIVE
Nitrite: NEGATIVE
Protein, ur: NEGATIVE mg/dL
SPECIFIC GRAVITY, URINE: 1.02 (ref 1.005–1.030)
UROBILINOGEN UA: 2 mg/dL — AB (ref 0.0–1.0)
pH: 7 (ref 5.0–8.0)

## 2015-01-03 MED ORDER — PREDNISONE 5 MG PO TABS: 10.0000 mg | ORAL_TABLET | Freq: Every day | ORAL | Status: DC

## 2015-01-03 NOTE — Progress Notes (Signed)
C/o itchy rash on legs/arms/abdomen-started about a month ago. Was taking benadryl, at first it helped but then it stopped helping.

## 2015-01-03 NOTE — Progress Notes (Signed)
Itch, whole body rash note resonding to [facility] with [facility]  Subjective:  Olivia Faulkner is a 29 y.o. G2P0010 at [redacted]w[redacted]d being seen today for ongoing prenatal care.  Patient reports rash.  Contractions: Not present.  Vag. Bleeding: None. Movement: Present. Denies leaking of fluid.   The following portions of the patient's history were reviewed and updated as appropriate: allergies, current medications, past family history, past medical history, past social history, past surgical history and problem list.   Objective:   Filed Vitals:   01/03/15 1028 01/03/15 1037  BP: 104/45 113/63  Pulse: 72   Temp: 97.9 F (36.6 C)   Weight: 114 lb 6.4 oz (51.891 kg)     Fetal Status: Fetal Heart Rate (bpm): 142   Movement: Present     General:  Alert, oriented and cooperative. Patient is in no acute distress.  Skin: Skin is warm and dry. No rash noted.   Cardiovascular: Normal heart rate noted  Respiratory: Normal respiratory effort, no problems with respiration noted  Abdomen: Soft, gravid, appropriate for gestational age. Pain/Pressure: Absent     Pelvic: Vag. Bleeding: None Vag D/C Character: White   Cervical exam deferred        Extremities: Normal range of motion.  Edema: Trace  Mental Status: Normal mood and affect. Normal behavior. Normal judgment and thought content.  Erythema rash trunk and extremities Urinalysis: Urine Protein: Negative Urine Glucose: Negative  Assessment and Plan:  Pregnancy: G2P0010 at [redacted]w[redacted]d  There are no diagnoses linked to this encounter. Preterm labor symptoms and general obstetric precautions including but not limited to vaginal bleeding, contractions, leaking of fluid and fetal movement were reviewed in detail with the patient. Please refer to After Visit Summary for other counseling recommendations.  Return in about 4 weeks (around 01/31/2015). F/u US 3 week Prednisone 10 mg for 10 days  Adam Phenix, MD

## 2015-01-03 NOTE — Patient Instructions (Signed)
Second Trimester of Pregnancy The second trimester is from week 13 through week 28, months 4 through 6. The second trimester is often a time when you feel your best. Your body has also adjusted to being pregnant, and you begin to feel better physically. Usually, morning sickness has lessened or quit completely, you may have more energy, and you may have an increase in appetite. The second trimester is also a time when the fetus is growing rapidly. At the end of the sixth month, the fetus is about 9 inches long and weighs about 1 pounds. You will likely begin to feel the baby move (quickening) between 18 and 20 weeks of the pregnancy. BODY CHANGES Your body goes through many changes during pregnancy. The changes vary from woman to woman.   Your weight will continue to increase. You will notice your lower abdomen bulging out.  You may begin to get stretch marks on your hips, abdomen, and breasts.  You may develop headaches that can be relieved by medicines approved by your health care Suezette Lafave.  You may urinate more often because the fetus is pressing on your bladder.  You may develop or continue to have heartburn as a result of your pregnancy.  You may develop constipation because certain hormones are causing the muscles that push waste through your intestines to slow down.  You may develop hemorrhoids or swollen, bulging veins (varicose veins).  You may have back pain because of the weight gain and pregnancy hormones relaxing your joints between the bones in your pelvis and as a result of a shift in weight and the muscles that support your balance.  Your breasts will continue to grow and be tender.  Your gums may bleed and may be sensitive to brushing and flossing.  Dark spots or blotches (chloasma, mask of pregnancy) may develop on your face. This will likely fade after the baby is born.  A dark line from your belly button to the pubic area (linea nigra) may appear. This will likely fade  after the baby is born.  You may have changes in your hair. These can include thickening of your hair, rapid growth, and changes in texture. Some women also have hair loss during or after pregnancy, or hair that feels dry or thin. Your hair will most likely return to normal after your baby is born. WHAT TO EXPECT AT YOUR PRENATAL VISITS During a routine prenatal visit:  You will be weighed to make sure you and the fetus are growing normally.  Your blood pressure will be taken.  Your abdomen will be measured to track your baby's growth.  The fetal heartbeat will be listened to.  Any test results from the previous visit will be discussed. Your health care Billijo Dilling may ask you:  How you are feeling.  If you are feeling the baby move.  If you have had any abnormal symptoms, such as leaking fluid, bleeding, severe headaches, or abdominal cramping.  If you have any questions. Other tests that may be performed during your second trimester include:  Blood tests that check for:  Low iron levels (anemia).  Gestational diabetes (between 24 and 28 weeks).  Rh antibodies.  Urine tests to check for infections, diabetes, or protein in the urine.  An ultrasound to confirm the proper growth and development of the baby.  An amniocentesis to check for possible genetic problems.  Fetal screens for spina bifida and Down syndrome. HOME CARE INSTRUCTIONS   Avoid all smoking, herbs, alcohol, and unprescribed   drugs. These chemicals affect the formation and growth of the baby.  Follow your health care Keymarion Bearman's instructions regarding medicine use. There are medicines that are either safe or unsafe to take during pregnancy.  Exercise only as directed by your health care Doneen Ollinger. Experiencing uterine cramps is a good sign to stop exercising.  Continue to eat regular, healthy meals.  Wear a good support bra for breast tenderness.  Do not use hot tubs, steam rooms, or saunas.  Wear your  seat belt at all times when driving.  Avoid raw meat, uncooked cheese, cat litter boxes, and soil used by cats. These carry germs that can cause birth defects in the baby.  Take your prenatal vitamins.  Try taking a stool softener (if your health care Ikeem Cleckler approves) if you develop constipation. Eat more high-fiber foods, such as fresh vegetables or fruit and whole grains. Drink plenty of fluids to keep your urine clear or pale yellow.  Take warm sitz baths to soothe any pain or discomfort caused by hemorrhoids. Use hemorrhoid cream if your health care Anuradha Chabot approves.  If you develop varicose veins, wear support hose. Elevate your feet for 15 minutes, 3-4 times a day. Limit salt in your diet.  Avoid heavy lifting, wear low heel shoes, and practice good posture.  Rest with your legs elevated if you have leg cramps or low back pain.  Visit your dentist if you have not gone yet during your pregnancy. Use a soft toothbrush to brush your teeth and be gentle when you floss.  A sexual relationship may be continued unless your health care Shirely Toren directs you otherwise.  Continue to go to all your prenatal visits as directed by your health care Chonte Ricke. SEEK MEDICAL CARE IF:   You have dizziness.  You have mild pelvic cramps, pelvic pressure, or nagging pain in the abdominal area.  You have persistent nausea, vomiting, or diarrhea.  You have a bad smelling vaginal discharge.  You have pain with urination. SEEK IMMEDIATE MEDICAL CARE IF:   You have a fever.  You are leaking fluid from your vagina.  You have spotting or bleeding from your vagina.  You have severe abdominal cramping or pain.  You have rapid weight gain or loss.  You have shortness of breath with chest pain.  You notice sudden or extreme swelling of your face, hands, ankles, feet, or legs.  You have not felt your baby move in over an hour.  You have severe headaches that do not go away with  medicine.  You have vision changes. Document Released: 03/17/2001 Document Revised: 03/28/2013 Document Reviewed: 05/24/2012 ExitCare Patient Information 2015 [employer]. This information is not intended to replace advice given to you by your health care Norma Ignasiak. Make sure you discuss any questions you have with your health care Chelse Matas.  

## 2015-01-23 ENCOUNTER — Encounter (HOSPITAL_COMMUNITY): Payer: Self-pay

## 2015-01-23 ENCOUNTER — Ambulatory Visit (HOSPITAL_COMMUNITY)
Admission: RE | Admit: 2015-01-23 | Discharge: 2015-01-23 | Disposition: A | Discharge status: Home / Self Care | Payer: Medicaid Other | Source: Ambulatory Visit | Attending: Obstetrics & Gynecology | Admitting: Obstetrics & Gynecology

## 2015-01-23 DIAGNOSIS — F1920 Other psychoactive substance dependence, uncomplicated: Secondary | ICD-10-CM

## 2015-01-23 DIAGNOSIS — O99320 Drug use complicating pregnancy, unspecified trimester: Secondary | ICD-10-CM | POA: Diagnosis not present

## 2015-01-23 DIAGNOSIS — Z3A00 Weeks of gestation of pregnancy not specified: Secondary | ICD-10-CM | POA: Diagnosis not present

## 2015-01-30 ENCOUNTER — Other Ambulatory Visit: Payer: Self-pay | Admitting: Physician Assistant

## 2015-01-30 ENCOUNTER — Telehealth: Payer: Self-pay | Admitting: Physician Assistant

## 2015-01-30 DIAGNOSIS — L309 Dermatitis, unspecified: Secondary | ICD-10-CM

## 2015-01-30 NOTE — Telephone Encounter (Signed)
Patient currently pregnant. This medication is category C which means there is potential risk to fetus but there are not enough studies to assess true risk. Will defer medication refill and treatment to her GYN during her pregnancy.

## 2015-01-30 NOTE — Telephone Encounter (Signed)
Pt needing refill on Triamcinolone Acetonide (TRIAMCINOLONE 0.1 % CREAM : EUCERIN) CREAM Pharmacy: [facility] [address].  Eczema is worse during pregnancy. No refills left.

## 2015-01-31 ENCOUNTER — Ambulatory Visit (INDEPENDENT_AMBULATORY_CARE_PROVIDER_SITE_OTHER): Payer: Medicaid Other | Admitting: Obstetrics & Gynecology

## 2015-01-31 VITALS — BP 108/61 | HR 71 | Temp 98.4°F | Wt 117.8 lb

## 2015-01-31 DIAGNOSIS — L309 Dermatitis, unspecified: Secondary | ICD-10-CM

## 2015-01-31 DIAGNOSIS — O0992 Supervision of high risk pregnancy, unspecified, second trimester: Secondary | ICD-10-CM

## 2015-01-31 LAB — POCT URINALYSIS DIP (DEVICE)
GLUCOSE, UA: NEGATIVE mg/dL
HGB URINE DIPSTICK: NEGATIVE
Ketones, ur: NEGATIVE mg/dL
NITRITE: NEGATIVE
PH: 6.5 (ref 5.0–8.0)
PROTEIN: NEGATIVE mg/dL
SPECIFIC GRAVITY, URINE: 1.02 (ref 1.005–1.030)
UROBILINOGEN UA: 1 mg/dL (ref 0.0–1.0)

## 2015-01-31 MED ORDER — TRIAMCINOLONE 0.1 % CREAM:EUCERIN CREAM 1:1: 1.0000 "application " | TOPICAL_CREAM | Freq: Two times a day (BID) | CUTANEOUS | Status: DC

## 2015-01-31 MED ORDER — PREDNISONE 5 MG PO TABS: 5.0000 mg | ORAL_TABLET | Freq: Every day | ORAL | Status: DC

## 2015-01-31 NOTE — Progress Notes (Signed)
Breastfeeding tip of the week reviewed Urine : Trace amt wbcs, small amt bilirubin

## 2015-01-31 NOTE — Progress Notes (Signed)
Rash and itch returned when prednisone was d/c, same as before Subjective:  Olivia Faulkner is a 29 y.o. G2P0010 at 6045w2d being seen today for ongoing prenatal care.  Patient reports rash and itch.  Contractions: Not present.  Vag. Bleeding: None. Movement: Present. Denies leaking of fluid.   The following portions of the patient's history were reviewed and updated as appropriate: allergies, current medications, past family history, past medical history, past social history, past surgical history and problem list. Problem list updated.  Objective:   Filed Vitals:   01/31/15 1022  BP: 108/61  Pulse: 71  Temp: 98.4 F (36.9 C)  Weight: 117 lb 12.8 oz (53.434 kg)    Fetal Status: Fetal Heart Rate (bpm): 155   Movement: Present     General:  Alert, oriented and cooperative. Patient is in no acute distress.  Skin: Skin is warm and dry. No rash noted.   Cardiovascular: Normal heart rate noted  Respiratory: Normal respiratory effort, no problems with respiration noted  Abdomen: Soft, gravid, appropriate for gestational age. Pain/Pressure: Absent     Pelvic: Vag. Bleeding: None     Cervical exam deferred        Extremities: Normal range of motion.  Edema: None  Mental Status: Normal mood and affect. Normal behavior. Normal judgment and thought content.   Urinalysis: Urine Protein: Negative Urine Glucose: Negative  Assessment and Plan:  Pregnancy: G2P0010 at 4645w2d  1. Eczema  - Triamcinolone Acetonide (TRIAMCINOLONE 0.1 % CREAM : EUCERIN) CREA; Apply 1 application topically 2 (two) times daily.  Dispense: 1 each; Refill: 6 - predniSONE (DELTASONE) 5 MG tablet; Take 1 tablet (5 mg total) by mouth daily with breakfast.  Dispense: 20 tablet; Refill: 2  2. Supervision of high risk pregnancy, antepartum, second trimester suboxone Tx, [place] for growth  Preterm labor symptoms and general obstetric precautions including but not limited to vaginal bleeding, contractions, leaking of fluid and  fetal movement were reviewed in detail with the patient. Please refer to After Visit Summary for other counseling recommendations.  4 week f/u US in 6 weeks for growth   Adam PhenixJames G Shirrell Solinger, MD

## 2015-01-31 NOTE — Patient Instructions (Signed)
Eczema Eczema, also called atopic dermatitis, is a skin disorder that causes inflammation of the skin. It causes a red rash and dry, scaly skin. The skin becomes very itchy. Eczema is generally worse during the cooler winter months and often improves with the warmth of summer. Eczema usually starts showing signs in infancy. Some children outgrow eczema, but it may last through adulthood.  CAUSES  The exact cause of eczema is not known, but it appears to run in families. People with eczema often have a family history of eczema, allergies, asthma, or hay fever. Eczema is not contagious. Flare-ups of the condition may be caused by:   Contact with something you are sensitive or allergic to.   Stress. SIGNS AND SYMPTOMS  Dry, scaly skin.   Red, itchy rash.   Itchiness. This may occur before the skin rash and may be very intense.  DIAGNOSIS  The diagnosis of eczema is usually made based on symptoms and medical history. TREATMENT  Eczema cannot be cured, but symptoms usually can be controlled with treatment and other strategies. A treatment plan might include:  Controlling the itching and scratching.   Use over-the-counter antihistamines as directed for itching. This is especially useful at night when the itching tends to be worse.   Use over-the-counter steroid creams as directed for itching.   Avoid scratching. Scratching makes the rash and itching worse. It may also result in a skin infection (impetigo) due to a break in the skin caused by scratching.   Keeping the skin well moisturized with creams every day. This will seal in moisture and help prevent dryness. Lotions that contain alcohol and water should be avoided because they can dry the skin.   Limiting exposure to things that you are sensitive or allergic to (allergens).   Recognizing situations that cause stress.   Developing a plan to manage stress.  HOME CARE INSTRUCTIONS   Only take over-the-counter or  prescription medicines as directed by your health care provider.   Do not use anything on the skin without checking with your health care provider.   Keep baths or showers short (5 minutes) in warm (not hot) water. Use mild cleansers for bathing. These should be unscented. You may add nonperfumed bath oil to the bath water. It is best to avoid soap and bubble bath.   Immediately after a bath or shower, when the skin is still damp, apply a moisturizing ointment to the entire body. This ointment should be a petroleum ointment. This will seal in moisture and help prevent dryness. The thicker the ointment, the better. These should be unscented.   Keep fingernails cut short. Children with eczema may need to wear soft gloves or mittens at night after applying an ointment.   Dress in clothes made of cotton or cotton blends. Dress lightly, because heat increases itching.   A child with eczema should stay away from anyone with fever blisters or cold sores. The virus that causes fever blisters (herpes simplex) can cause a serious skin infection in children with eczema. SEEK MEDICAL CARE IF:   Your itching interferes with sleep.   Your rash gets worse or is not better within 1 week after starting treatment.   You see pus or soft yellow scabs in the rash area.   You have a fever.   You have a rash flare-up after contact with someone who has fever blisters.    This information is not intended to replace advice given to you by your health care   provider. Make sure you discuss any questions you have with your health care provider.   Document Released: 03/20/2000 Document Revised: 01/11/2013 Document Reviewed: 10/24/2012 Elsevier Interactive Patient Education 2016 Elsevier Inc.  

## 2015-01-31 NOTE — Progress Notes (Signed)
Triamcinolone called to pharmacy.

## 2015-02-01 NOTE — Telephone Encounter (Signed)
Patient informed, understood & agreed, seen GYN yesterday/SLS

## 2015-02-07 ENCOUNTER — Encounter: Payer: Self-pay | Admitting: *Deleted

## 2015-02-07 ENCOUNTER — Telehealth: Payer: Self-pay | Admitting: *Deleted

## 2015-02-07 NOTE — Telephone Encounter (Addendum)
Pt left message yesterday stating that she is scheduled to have some teeth pulled next week and needs a consent for the dentist. I called pt back and left a message stating that we will fax a letter to her dentist. Please call back and give the fax number.  *Letter has been prepared in EMR and is ready to be faxed following pt's call.  [phone]  Pt left new message with information requested. Letter faxed to [facility] [phone]. Called pt and left message stating that letter will be faxed today.

## 2015-02-26 ENCOUNTER — Encounter: Payer: Medicaid Other | Admitting: Advanced Practice Midwife

## 2015-03-05 ENCOUNTER — Ambulatory Visit (INDEPENDENT_AMBULATORY_CARE_PROVIDER_SITE_OTHER): Payer: Medicaid Other | Admitting: Student

## 2015-03-05 VITALS — BP 128/63 | HR 80 | Temp 98.5°F | Wt 125.0 lb

## 2015-03-05 DIAGNOSIS — O99323 Drug use complicating pregnancy, third trimester: Secondary | ICD-10-CM

## 2015-03-05 DIAGNOSIS — Z3A28 28 weeks gestation of pregnancy: Secondary | ICD-10-CM

## 2015-03-05 DIAGNOSIS — O0993 Supervision of high risk pregnancy, unspecified, third trimester: Secondary | ICD-10-CM

## 2015-03-05 DIAGNOSIS — F1120 Opioid dependence, uncomplicated: Secondary | ICD-10-CM

## 2015-03-05 LAB — CBC
HCT: 31.5 % — ABNORMAL LOW (ref 36.0–46.0)
HEMOGLOBIN: 10.8 g/dL — AB (ref 12.0–15.0)
MCH: 30.5 pg (ref 26.0–34.0)
MCHC: 34.3 g/dL (ref 30.0–36.0)
MCV: 89 fL (ref 78.0–100.0)
MPV: 10.1 fL (ref 8.6–12.4)
PLATELETS: 240 10*3/uL (ref 150–400)
RBC: 3.54 MIL/uL — AB (ref 3.87–5.11)
RDW: 12.5 % (ref 11.5–15.5)
WBC: 11.5 10*3/uL — AB (ref 4.0–10.5)

## 2015-03-05 LAB — POCT URINALYSIS DIP (DEVICE)
BILIRUBIN URINE: NEGATIVE
Glucose, UA: NEGATIVE mg/dL
Hgb urine dipstick: NEGATIVE
Ketones, ur: NEGATIVE mg/dL
LEUKOCYTES UA: NEGATIVE
NITRITE: NEGATIVE
Protein, ur: NEGATIVE mg/dL
Specific Gravity, Urine: 1.025 (ref 1.005–1.030)
Urobilinogen, UA: 0.2 mg/dL (ref 0.0–1.0)
pH: 6.5 (ref 5.0–8.0)

## 2015-03-05 NOTE — Progress Notes (Signed)
Urobilinogen, UA: 2.0  Pt will do 1hr gtt on Monday December 5th due to her work schedule today.  Pt also denied Tdap at this time but reports she may want it at a later date.

## 2015-03-05 NOTE — Patient Instructions (Addendum)
Fetal Movement Counts Patient Name: __________________________________________________ Patient Due Date: ____________________ Performing a fetal movement count is highly recommended in high-risk pregnancies, but it is good for every pregnant woman to do. Your health care provider may ask you to start counting fetal movements at 28 weeks of the pregnancy. Fetal movements often increase:  After eating a full meal.  After physical activity.  After eating or drinking something sweet or cold.  At rest. Pay attention to when you feel the baby is most active. This will help you notice a pattern of your baby's sleep and wake cycles and what factors contribute to an increase in fetal movement. It is important to perform a fetal movement count at the same time each day when your baby is normally most active.  HOW TO COUNT FETAL MOVEMENTS 1. Find a quiet and comfortable area to sit or lie down on your left side. Lying on your left side provides the best blood and oxygen circulation to your baby. 2. Write down the day and time on a sheet of paper or in a journal. 3. Start counting kicks, flutters, swishes, rolls, or jabs in a 2-hour period. You should feel at least 10 movements within 2 hours. 4. If you do not feel 10 movements in 2 hours, wait 2-3 hours and count again. Look for a change in the pattern or not enough counts in 2 hours. SEEK MEDICAL CARE IF:  You feel less than 10 counts in 2 hours, tried twice.  There is no movement in over an hour.  The pattern is changing or taking longer each day to reach 10 counts in 2 hours.  You feel the baby is not moving as he or she usually does. Date: ____________ Movements: ____________ Start time: ____________ Finish time: ____________  Date: ____________ Movements: ____________ Start time: ____________ Finish time: ____________ Date: ____________ Movements: ____________ Start time: ____________ Finish time: ____________ Date: ____________ Movements:  ____________ Start time: ____________ Finish time: ____________ Date: ____________ Movements: ____________ Start time: ____________ Finish time: ____________ Date: ____________ Movements: ____________ Start time: ____________ Finish time: ____________ Date: ____________ Movements: ____________ Start time: ____________ Finish time: ____________ Date: ____________ Movements: ____________ Start time: ____________ Finish time: ____________  Date: ____________ Movements: ____________ Start time: ____________ Finish time: ____________ Date: ____________ Movements: ____________ Start time: ____________ Finish time: ____________ Date: ____________ Movements: ____________ Start time: ____________ Finish time: ____________ Date: ____________ Movements: ____________ Start time: ____________ Finish time: ____________ Date: ____________ Movements: ____________ Start time: ____________ Finish time: ____________ Date: ____________ Movements: ____________ Start time: ____________ Finish time: ____________ Date: ____________ Movements: ____________ Start time: ____________ Finish time: ____________  Date: ____________ Movements: ____________ Start time: ____________ Finish time: ____________ Date: ____________ Movements: ____________ Start time: ____________ Finish time: ____________ Date: ____________ Movements: ____________ Start time: ____________ Finish time: ____________ Date: ____________ Movements: ____________ Start time: ____________ Finish time: ____________ Date: ____________ Movements: ____________ Start time: ____________ Finish time: ____________ Date: ____________ Movements: ____________ Start time: ____________ Finish time: ____________ Date: ____________ Movements: ____________ Start time: ____________ Finish time: ____________  Date: ____________ Movements: ____________ Start time: ____________ Finish time: ____________ Date: ____________ Movements: ____________ Start time: ____________ Finish  time: ____________ Date: ____________ Movements: ____________ Start time: ____________ Finish time: ____________ Date: ____________ Movements: ____________ Start time: ____________ Finish time: ____________ Date: ____________ Movements: ____________ Start time: ____________ Finish time: ____________ Date: ____________ Movements: ____________ Start time: ____________ Finish time: ____________ Date: ____________ Movements: ____________ Start time: ____________ Finish time: ____________  Date: ____________ Movements: ____________ Start time: ____________ Finish   time: ____________ Date: ____________ Movements: ____________ Start time: ____________ Doreatha Martin time: ____________ Date: ____________ Movements: ____________ Start time: ____________ Doreatha Martin time: ____________ Date: ____________ Movements: ____________ Start time: ____________ Doreatha Martin time: ____________ Date: ____________ Movements: ____________ Start time: ____________ Doreatha Martin time: ____________ Date: ____________ Movements: ____________ Start time: ____________ Doreatha Martin time: ____________ Date: ____________ Movements: ____________ Start time: ____________ Doreatha Martin time: ____________  Date: ____________ Movements: ____________ Start time: ____________ Doreatha Martin time: ____________ Date: ____________ Movements: ____________ Start time: ____________ Doreatha Martin time: ____________ Date: ____________ Movements: ____________ Start time: ____________ Doreatha Martin time: ____________ Date: ____________ Movements: ____________ Start time: ____________ Doreatha Martin time: ____________ Date: ____________ Movements: ____________ Start time: ____________ Doreatha Martin time: ____________ Date: ____________ Movements: ____________ Start time: ____________ Doreatha Martin time: ____________ Date: ____________ Movements: ____________ Start time: ____________ Doreatha Martin time: ____________  Date: ____________ Movements: ____________ Start time: ____________ Doreatha Martin time: ____________ Date: ____________  Movements: ____________ Start time: ____________ Doreatha Martin time: ____________ Date: ____________ Movements: ____________ Start time: ____________ Doreatha Martin time: ____________ Date: ____________ Movements: ____________ Start time: ____________ Doreatha Martin time: ____________ Date: ____________ Movements: ____________ Start time: ____________ Doreatha Martin time: ____________ Date: ____________ Movements: ____________ Start time: ____________ Doreatha Martin time: ____________ Date: ____________ Movements: ____________ Start time: ____________ Doreatha Martin time: ____________  Date: ____________ Movements: ____________ Start time: ____________ Doreatha Martin time: ____________ Date: ____________ Movements: ____________ Start time: ____________ Doreatha Martin time: ____________ Date: ____________ Movements: ____________ Start time: ____________ Doreatha Martin time: ____________ Date: ____________ Movements: ____________ Start time: ____________ Doreatha Martin time: ____________ Date: ____________ Movements: ____________ Start time: ____________ Doreatha Martin time: ____________ Date: ____________ Movements: ____________ Start time: ____________ Doreatha Martin time: ____________   This information is not intended to replace advice given to you by your health care provider. Make sure you discuss any questions you have with your health care provider.   Document Released: 04/22/2006 Document Revised: 04/13/2014 Document Reviewed: 01/18/2012 Elsevier Interactive Patient Education 2016 Elsevier Inc. Premature Rupture and Preterm Premature Rupture of Membranes Premature rupture of membranes (PROM) is when the membranes (amniotic sac) break open before contractions or labor starts. Rupture of membranes is commonly referred to as your water breaking. If PROM occurs before 37 weeks of pregnancy, it is called preterm premature rupture of membranes (PPROM). The amniotic sac holds the fetus, keeps infection out, and performs other important functions. Having the amniotic sac rupture before 37  weeks of pregnancy can lead to serious problems and requires immediate attention by your health care provider. CAUSES  PROM near the end of the pregnancy may be caused by natural weakening of the membranes. PPROM is often due to an infection. Other factors that may be associated with PROM include:  Stretching of the amniotic sac because of carrying multiples or having too much amniotic fluid.  Trauma.  Smoking during pregnancy.  Poor nutrition.  Previous preterm birth.  Vaginal bleeding.  Little to no prenatal care.  Problems with the placenta, such as placenta previa or placental abruption. RISKS OF PROM AND PPROM 5. Delivering a premature baby. 6. Getting a serious infection of the placental tissues (chorioamnionitis). 7. Early detachment of the placenta from the uterus (placental abruption). 8. Compression of the umbilical cord. 9. Needing a cesarean birth. 10. Developing a serious infection after delivery. SIGNS OF PROM OR PPROM   A sudden gush or slow leaking of fluid from the vagina.  Constant wet underwear. Sometimes, women mistake the leaking or wetness for urine, especially if the leak is slow and not a gush of fluid. If there is constant leaking or your underwear continues to get wet, your membranes have likely  ruptured. WHAT TO DO IF YOU THINK YOUR MEMBRANES HAVE RUPTURED Call your health care provider right away. You will need to go to the hospital to get checked immediately. WHAT HAPPENS IF YOU ARE DIAGNOSED WITH PROM OR PPROM? Once you arrive at the hospital, you will have tests done. A cervical exam will be performed to check if the cervix has softened or started to open (dilate). If you are diagnosed with PROM, you may be induced within 24 hours if you are not having contractions. If you are diagnosed with PPROM and are not having contractions, you may be induced depending on your trimester.  If you have PPROM, you:  And your baby will be monitored closely for  signs of infection or other complications.  May be given an antibiotic medicine to lower the chances of an infection developing.  May be given a steroid medicine to help mature the baby's lungs faster.  May be given a medicine to stop preterm labor.  May be ordered to be on bed rest at home or in the hospital.  May be induced if complications arise for you or the baby. Your treatment will depend on many factors, such as how far along you are, the development of the baby, and other complications that may arise.   This information is not intended to replace advice given to you by your health care provider. Make sure you discuss any questions you have with your health care provider.   Document Released: 03/23/2005 Document Revised: 01/11/2013 Document Reviewed: 07/12/2012 Elsevier Interactive Patient Education [employer] Else[employer] Inc.

## 2015-03-05 NOTE — Progress Notes (Signed)
Subjective:  Olivia Faulkner is a 29 y.o. G2P0010 at 7958w0d being seen today for ongoing prenatal care.  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  Patient reports no complaints.  Contractions: Not present. Vag. Bleeding: None.  Movement: Present. Denies leaking of fluid.   The following portions of the patient's history were reviewed and updated as appropriate: allergies, current medications, past family history, past medical history, past social history, past surgical history and problem list. Problem list updated.  Objective:   Filed Vitals:   03/05/15 1026  BP: 128/63  Pulse: 80  Temp: 98.5 F (36.9 C)  Weight: 125 lb (56.7 kg)    Fetal Status: Fetal Heart Rate (bpm): 145   Movement: Present     General:  Alert, oriented and cooperative. Patient is in no acute distress.  Skin: Skin is warm and dry. No rash noted.   Cardiovascular: Normal heart rate noted  Respiratory: Normal respiratory effort, no problems with respiration noted  Abdomen: Soft, gravid, appropriate for gestational age. Pain/Pressure: Absent     Pelvic: Vag. Bleeding: None     Cervical exam deferred        Extremities: Normal range of motion.  Edema: None  Mental Status: Normal mood and affect. Normal behavior. Normal judgment and thought content.   Urinalysis: Urine Protein: Negative Urine Glucose: Negative  Assessment and Plan:  Pregnancy: G2P0010 at 6058w0d  1. [redacted] weeks gestation of pregnancy  - CBC - RPR - HIV antibody (with reflex)  Preterm labor symptoms and general obstetric precautions including but not limited to vaginal bleeding, contractions, leaking of fluid and fetal movement were reviewed in detail with the patient. Please  refer to After Visit Summary for other counseling recommendations.  Return in about 2 weeks (around 03/19/2015).  Pt will do 1 hr gtt next Monday after ultrasound appt. States doesn't have time to stay for it today.    Judeth HornErin Rodneshia Greenhouse, NP

## 2015-03-06 ENCOUNTER — Encounter: Payer: Self-pay | Admitting: Student

## 2015-03-06 DIAGNOSIS — O99019 Anemia complicating pregnancy, unspecified trimester: Secondary | ICD-10-CM | POA: Insufficient documentation

## 2015-03-06 LAB — HIV ANTIBODY (ROUTINE TESTING W REFLEX): HIV 1&2 Ab, 4th Generation: NONREACTIVE

## 2015-03-06 LAB — RPR

## 2015-03-11 ENCOUNTER — Encounter (HOSPITAL_COMMUNITY): Payer: Self-pay

## 2015-03-11 ENCOUNTER — Ambulatory Visit (HOSPITAL_COMMUNITY)
Admission: RE | Admit: 2015-03-11 | Discharge: 2015-03-11 | Disposition: A | Discharge status: Home / Self Care | Payer: Medicaid Other | Source: Ambulatory Visit | Attending: Obstetrics & Gynecology | Admitting: Obstetrics & Gynecology

## 2015-03-11 ENCOUNTER — Other Ambulatory Visit: Payer: Medicaid Other

## 2015-03-11 ENCOUNTER — Other Ambulatory Visit (HOSPITAL_COMMUNITY): Payer: Self-pay | Admitting: Maternal and Fetal Medicine

## 2015-03-11 VITALS — BP 123/63 | HR 99 | Wt 127.2 lb

## 2015-03-11 DIAGNOSIS — F1120 Opioid dependence, uncomplicated: Secondary | ICD-10-CM

## 2015-03-11 DIAGNOSIS — O99320 Drug use complicating pregnancy, unspecified trimester: Secondary | ICD-10-CM

## 2015-03-11 DIAGNOSIS — F1920 Other psychoactive substance dependence, uncomplicated: Secondary | ICD-10-CM

## 2015-03-11 DIAGNOSIS — O99323 Drug use complicating pregnancy, third trimester: Principal | ICD-10-CM

## 2015-03-11 DIAGNOSIS — Z3A28 28 weeks gestation of pregnancy: Secondary | ICD-10-CM

## 2015-03-11 DIAGNOSIS — O0993 Supervision of high risk pregnancy, unspecified, third trimester: Secondary | ICD-10-CM

## 2015-03-12 LAB — GLUCOSE TOLERANCE, 1 HOUR (50G) W/O FASTING: Glucose, 1 Hour GTT: 111 mg/dL (ref 70–140)

## 2015-03-21 ENCOUNTER — Encounter: Payer: Medicaid Other | Admitting: Obstetrics & Gynecology

## 2015-04-04 ENCOUNTER — Ambulatory Visit (INDEPENDENT_AMBULATORY_CARE_PROVIDER_SITE_OTHER): Payer: Medicaid Other | Admitting: Family Medicine

## 2015-04-04 VITALS — BP 118/96 | HR 94 | Temp 98.4°F | Wt 131.0 lb

## 2015-04-04 DIAGNOSIS — O99013 Anemia complicating pregnancy, third trimester: Secondary | ICD-10-CM | POA: Diagnosis not present

## 2015-04-04 DIAGNOSIS — D649 Anemia, unspecified: Secondary | ICD-10-CM

## 2015-04-04 DIAGNOSIS — O99343 Other mental disorders complicating pregnancy, third trimester: Secondary | ICD-10-CM | POA: Diagnosis not present

## 2015-04-04 DIAGNOSIS — O99323 Drug use complicating pregnancy, third trimester: Secondary | ICD-10-CM

## 2015-04-04 DIAGNOSIS — F329 Major depressive disorder, single episode, unspecified: Secondary | ICD-10-CM | POA: Diagnosis not present

## 2015-04-04 DIAGNOSIS — O0993 Supervision of high risk pregnancy, unspecified, third trimester: Secondary | ICD-10-CM

## 2015-04-04 DIAGNOSIS — F1120 Opioid dependence, uncomplicated: Secondary | ICD-10-CM | POA: Diagnosis not present

## 2015-04-04 LAB — POCT URINALYSIS DIP (DEVICE)
Bilirubin Urine: NEGATIVE
Glucose, UA: NEGATIVE mg/dL
Hgb urine dipstick: NEGATIVE
Ketones, ur: NEGATIVE mg/dL
Nitrite: NEGATIVE
Protein, ur: NEGATIVE mg/dL
Specific Gravity, Urine: 1.025 (ref 1.005–1.030)
Urobilinogen, UA: 0.2 mg/dL (ref 0.0–1.0)
pH: 6.5 (ref 5.0–8.0)

## 2015-04-04 MED ORDER — TETANUS-DIPHTH-ACELL PERTUSSIS 5-2.5-18.5 LF-MCG/0.5 IM SUSP
0.5000 mL | Freq: Once | INTRAMUSCULAR | Status: AC
  Administered 2015-04-04: 0.5 mL via INTRAMUSCULAR

## 2015-04-04 NOTE — Progress Notes (Signed)
Subjective:  Olivia Faulkner is a 29 y.o. G2P0010 at 8150w2d being seen today for ongoing prenatal care.  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  Patient reports no complaints.  Contractions: Not present.  .  Movement: Present. Denies leaking of fluid.   The following portions of the patient's history were reviewed and updated as appropriate: allergies, current medications, past family history, past medical history, past social history, past surgical history and problem list. Problem list updated.  Objective:   Filed Vitals:   04/04/15 1124  BP: 118/96  Pulse: 94  Temp: 98.4 F (36.9 C)  Weight: 131 lb (59.421 kg)    Fetal Status: Fetal Heart Rate (bpm): 132 Fundal Height: 30 cm Movement: Present     General:  Alert, oriented and cooperative. Patient is in no acute distress.  Skin: Skin is warm and dry. No rash noted.   Cardiovascular: Normal heart rate noted  Respiratory: Normal respiratory effort, no problems with respiration noted  Abdomen: Soft, gravid, appropriate for gestational age. Pain/Pressure: Present     Pelvic:       Cervical exam deferred        Extremities: Normal range of motion.  Edema: None  Mental Status: Normal mood and affect. Normal behavior. Normal judgment and thought content.   Urinalysis: Urine Protein: Negative Urine Glucose: Negative  Assessment and Plan:  Pregnancy: G2P0010 at 6650w2d  1. Supervision of high risk pregnancy, antepartum, third trimester Continue prenatal care.  - Tdap (BOOSTRIX) injection 0.5 mL; Inject 0.5 mLs into the muscle once.  2. 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 Please refer to After Visit Summary for other counseling recommendations.  Return in 2 weeks (on 04/18/2015).   Reva Boresanya S Langdon Crosson, MD

## 2015-04-04 NOTE — Patient Instructions (Signed)
Third Trimester of Pregnancy The third trimester is from week 29 through week 42, months 7 through 9. The third trimester is a time when the fetus is growing rapidly. At the end of the ninth month, the fetus is about 20 inches in length and weighs 6-10 pounds.  BODY CHANGES Your body goes through many changes during pregnancy. The changes vary from woman to woman.   Your weight will continue to increase. You can expect to gain 25-35 pounds (11-16 kg) by the end of the pregnancy.  You may begin to get stretch marks on your hips, abdomen, and breasts.  You may urinate more often because the fetus is moving lower into your pelvis and pressing on your bladder.  You may develop or continue to have heartburn as a result of your pregnancy.  You may develop constipation because certain hormones are causing the muscles that push waste through your intestines to slow down.  You may develop hemorrhoids or swollen, bulging veins (varicose veins).  You may have pelvic pain because of the weight gain and pregnancy hormones relaxing your joints between the bones in your pelvis. Backaches may result from overexertion of the muscles supporting your posture.  You may have changes in your hair. These can include thickening of your hair, rapid growth, and changes in texture. Some women also have hair loss during or after pregnancy, or hair that feels dry or thin. Your hair will most likely return to normal after your baby is born.  Your breasts will continue to grow and be tender. A yellow discharge may leak from your breasts called colostrum.  Your belly button may stick out.  You may feel short of breath because of your expanding uterus.  You may notice the fetus "dropping," or moving lower in your abdomen.  You may have a bloody mucus discharge. This usually occurs a few days to a week before labor begins.  Your cervix becomes thin and soft (effaced) near your due date. WHAT TO EXPECT AT YOUR  PRENATAL EXAMS  You will have prenatal exams every 2 weeks until week 36. Then, you will have weekly prenatal exams. During a routine prenatal visit:  You will be weighed to make sure you and the fetus are growing normally.  Your blood pressure is taken.  Your abdomen will be measured to track your baby's growth.  The fetal heartbeat will be listened to.  Any test results from the previous visit will be discussed.  You may have a cervical check near your due date to see if you have effaced. At around 36 weeks, your caregiver will check your cervix. At the same time, your caregiver will also perform a test on the secretions of the vaginal tissue. This test is to determine if a type of bacteria, Group B streptococcus, is present. Your caregiver will explain this further. Your caregiver may ask you:  What your birth plan is.  How you are feeling.  If you are feeling the baby move.  If you have had any abnormal symptoms, such as leaking fluid, bleeding, severe headaches, or abdominal cramping.  If you are using any tobacco products, including cigarettes, chewing tobacco, and electronic cigarettes.  If you have any questions. Other tests or screenings that may be performed during your third trimester include:  Blood tests that check for low iron levels (anemia).  Fetal testing to check the health, activity level, and growth of the fetus. Testing is done if you have certain medical conditions or if   there are problems during the pregnancy.  HIV (human immunodeficiency virus) testing. If you are at high risk, you may be screened for HIV during your third trimester of pregnancy. FALSE LABOR You may feel small, irregular contractions that eventually go away. These are called Braxton Hicks contractions, or false labor. Contractions may last for hours, days, or even weeks before true labor sets in. If contractions come at regular intervals, intensify, or become painful, it is best to be seen  by your caregiver.  SIGNS OF LABOR   Menstrual-like cramps.  Contractions that are 5 minutes apart or less.  Contractions that start on the top of the uterus and spread down to the lower abdomen and back.  A sense of increased pelvic pressure or back pain.  A watery or bloody mucus discharge that comes from the vagina. If you have any of these signs before the 37th week of pregnancy, call your caregiver right away. You need to go to the hospital to get checked immediately. HOME CARE INSTRUCTIONS   Avoid all smoking, herbs, alcohol, and unprescribed drugs. These chemicals affect the formation and growth of the baby.  Do not use any tobacco products, including cigarettes, chewing tobacco, and electronic cigarettes. If you need help quitting, ask your health care provider. You may receive counseling support and other resources to help you quit.  Follow your caregiver's instructions regarding medicine use. There are medicines that are either safe or unsafe to take during pregnancy.  Exercise only as directed by your caregiver. Experiencing uterine cramps is a good sign to stop exercising.  Continue to eat regular, healthy meals.  Wear a good support bra for breast tenderness.  Do not use hot tubs, steam rooms, or saunas.  Wear your seat belt at all times when driving.  Avoid raw meat, uncooked cheese, cat litter boxes, and soil used by cats. These carry germs that can cause birth defects in the baby.  Take your prenatal vitamins.  Take 1500-2000 mg of calcium daily starting at the 20th week of pregnancy until you deliver your baby.  Try taking a stool softener (if your caregiver approves) if you develop constipation. Eat more high-fiber foods, such as fresh vegetables or fruit and whole grains. Drink plenty of fluids to keep your urine clear or pale yellow.  Take warm sitz baths to soothe any pain or discomfort caused by hemorrhoids. Use hemorrhoid cream if your caregiver  approves.  If you develop varicose veins, wear support hose. Elevate your feet for 15 minutes, 3-4 times a day. Limit salt in your diet.  Avoid heavy lifting, wear low heal shoes, and practice good posture.  Rest a lot with your legs elevated if you have leg cramps or low back pain.  Visit your dentist if you have not gone during your pregnancy. Use a soft toothbrush to brush your teeth and be gentle when you floss.  A sexual relationship may be continued unless your caregiver directs you otherwise.  Do not travel far distances unless it is absolutely necessary and only with the approval of your caregiver.  Take prenatal classes to understand, practice, and ask questions about the labor and delivery.  Make a trial run to the hospital.  Pack your hospital bag.  Prepare the baby's nursery.  Continue to go to all your prenatal visits as directed by your caregiver. SEEK MEDICAL CARE IF:  You are unsure if you are in labor or if your water has broken.  You have dizziness.  You have   mild pelvic cramps, pelvic pressure, or nagging pain in your abdominal area.  You have persistent nausea, vomiting, or diarrhea.  You have a bad smelling vaginal discharge.  You have pain with urination. SEEK IMMEDIATE MEDICAL CARE IF:   You have a fever.  You are leaking fluid from your vagina.  You have spotting or bleeding from your vagina.  You have severe abdominal cramping or pain.  You have rapid weight loss or gain.  You have shortness of breath with chest pain.  You notice sudden or extreme swelling of your face, hands, ankles, feet, or legs.  You have not felt your baby move in over an hour.  You have severe headaches that do not go away with medicine.  You have vision changes.   This information is not intended to replace advice given to you by your health care provider. Make sure you discuss any questions you have with your health care provider.   Document Released:  03/17/2001 Document Revised: 04/13/2014 Document Reviewed: 05/24/2012 Elsevier Interactive Patient Education 2016 [employer]  Breastfeeding Deciding to breastfeed is one of the best choices you can make for you and your baby. A change in hormones during pregnancy causes your breast tissue to grow and increases the number and size of your milk ducts. These hormones also allow proteins, sugars, and fats from your blood supply to make breast milk in your milk-producing glands. Hormones prevent breast milk from being released before your baby is born as well as prompt milk flow after birth. Once breastfeeding has begun, thoughts of your baby, as well as his or her sucking or crying, can stimulate the release of milk from your milk-producing glands.  BENEFITS OF BREASTFEEDING For Your Baby  Your first milk (colostrum) helps your baby's digestive system function better.  There are antibodies in your milk that help your baby fight off infections.  Your baby has a lower incidence of asthma, allergies, and sudden infant death syndrome.  The nutrients in breast milk are better for your baby than infant formulas and are designed uniquely for your baby's needs.  Breast milk improves your baby's brain development.  Your baby is less likely to develop other conditions, such as childhood obesity, asthma, or type 2 diabetes mellitus. For You  Breastfeeding helps to create a very special bond between you and your baby.  Breastfeeding is convenient. Breast milk is always available at the correct temperature and costs nothing.  Breastfeeding helps to burn calories and helps you lose the weight gained during pregnancy.  Breastfeeding makes your uterus contract to its prepregnancy size faster and slows bleeding (lochia) after you give birth.   Breastfeeding helps to lower your risk of developing type 2 diabetes mellitus, osteoporosis, and breast or ovarian cancer later in life. SIGNS THAT YOUR BABY IS  HUNGRY Early Signs of Hunger  Increased alertness or activity.  Stretching.  Movement of the head from side to side.  Movement of the head and opening of the mouth when the corner of the mouth or cheek is stroked (rooting).  Increased sucking sounds, smacking lips, cooing, sighing, or squeaking.  Hand-to-mouth movements.  Increased sucking of fingers or hands. Late Signs of Hunger  Fussing.  Intermittent crying. Extreme Signs of Hunger Signs of extreme hunger will require calming and consoling before your baby will be able to breastfeed successfully. Do not wait for the following signs of extreme hunger to occur before you initiate breastfeeding:  Restlessness.  A loud, strong cry.  Screaming.   BREASTFEEDING BASICS Breastfeeding Initiation  Find a comfortable place to sit or lie down, with your neck and back well supported.  Place a pillow or rolled up blanket under your baby to bring him or her to the level of your breast (if you are seated). Nursing pillows are specially designed to help support your arms and your baby while you breastfeed.  Make sure that your baby's abdomen is facing your abdomen.  Gently massage your breast. With your fingertips, massage from your chest wall toward your nipple in a circular motion. This encourages milk flow. You may need to continue this action during the feeding if your milk flows slowly.  Support your breast with 4 fingers underneath and your thumb above your nipple. Make sure your fingers are well away from your nipple and your baby's mouth.  Stroke your baby's lips gently with your finger or nipple.  When your baby's mouth is open wide enough, quickly bring your baby to your breast, placing your entire nipple and as much of the colored area around your nipple (areola) as possible into your baby's mouth.  More areola should be visible above your baby's upper lip than below the lower lip.  Your baby's tongue should be between his  or her lower gum and your breast.  Ensure that your baby's mouth is correctly positioned around your nipple (latched). Your baby's lips should create a seal on your breast and be turned out (everted).  It is common for your baby to suck about 2-3 minutes in order to start the flow of breast milk. Latching Teaching your baby how to latch on to your breast properly is very important. An improper latch can cause nipple pain and decreased milk supply for you and poor weight gain in your baby. Also, if your baby is not latched onto your nipple properly, he or she may swallow some air during feeding. This can make your baby fussy. Burping your baby when you switch breasts during the feeding can help to get rid of the air. However, teaching your baby to latch on properly is still the best way to prevent fussiness from swallowing air while breastfeeding. Signs that your baby has successfully latched on to your nipple:  Silent tugging or silent sucking, without causing you pain.  Swallowing heard between every 3-4 sucks.  Muscle movement above and in front of his or her ears while sucking. Signs that your baby has not successfully latched on to nipple:  Sucking sounds or smacking sounds from your baby while breastfeeding.  Nipple pain. If you think your baby has not latched on correctly, slip your finger into the corner of your baby's mouth to break the suction and place it between your baby's gums. Attempt breastfeeding initiation again. Signs of Successful Breastfeeding Signs from your baby:  A gradual decrease in the number of sucks or complete cessation of sucking.  Falling asleep.  Relaxation of his or her body.  Retention of a small amount of milk in his or her mouth.  Letting go of your breast by himself or herself. Signs from you:  Breasts that have increased in firmness, weight, and size 1-3 hours after feeding.  Breasts that are softer immediately after  breastfeeding.  Increased milk volume, as well as a change in milk consistency and color by the fifth day of breastfeeding.  Nipples that are not sore, cracked, or bleeding. Signs That Your Baby is Getting Enough Milk  Wetting at least 3 diapers in a 24-hour period.   The urine should be clear and pale yellow by age 5 days.  At least 3 stools in a 24-hour period by age 5 days. The stool should be soft and yellow.  At least 3 stools in a 24-hour period by age 7 days. The stool should be seedy and yellow.  No loss of weight greater than 10% of birth weight during the first 3 days of age.  Average weight gain of 4-7 ounces (113-198 g) per week after age 4 days.  Consistent daily weight gain by age 5 days, without weight loss after the age of 2 weeks. After a feeding, your baby may spit up a small amount. This is common. BREASTFEEDING FREQUENCY AND DURATION Frequent feeding will help you make more milk and can prevent sore nipples and breast engorgement. Breastfeed when you feel the need to reduce the fullness of your breasts or when your baby shows signs of hunger. This is called "breastfeeding on demand." Avoid introducing a pacifier to your baby while you are working to establish breastfeeding (the first 4-6 weeks after your baby is born). After this time you may choose to use a pacifier. Research has shown that pacifier use during the first year of a baby's life decreases the risk of sudden infant death syndrome (SIDS). Allow your baby to feed on each breast as long as he or she wants. Breastfeed until your baby is finished feeding. When your baby unlatches or falls asleep while feeding from the first breast, offer the second breast. Because newborns are often sleepy in the first few weeks of life, you may need to awaken your baby to get him or her to feed. Breastfeeding times will vary from baby to baby. However, the following rules can serve as a guide to help you ensure that your baby is  properly fed:  Newborns (babies 4 weeks of age or younger) may breastfeed every 1-3 hours.  Newborns should not go longer than 3 hours during the day or 5 hours during the night without breastfeeding.  You should breastfeed your baby a minimum of 8 times in a 24-hour period until you begin to introduce solid foods to your baby at around 6 months of age. BREAST MILK PUMPING Pumping and storing breast milk allows you to ensure that your baby is exclusively fed your breast milk, even at times when you are unable to breastfeed. This is especially important if you are going back to work while you are still breastfeeding or when you are not able to be present during feedings. Your lactation consultant can give you guidelines on how long it is safe to store breast milk. A breast pump is a machine that allows you to pump milk from your breast into a sterile bottle. The pumped breast milk can then be stored in a refrigerator or freezer. Some breast pumps are operated by hand, while others use electricity. Ask your lactation consultant which type will work best for you. Breast pumps can be purchased, but some hospitals and breastfeeding support groups lease breast pumps on a monthly basis. A lactation consultant can teach you how to hand express breast milk, if you prefer not to use a pump. CARING FOR YOUR BREASTS WHILE YOU BREASTFEED Nipples can become dry, cracked, and sore while breastfeeding. The following recommendations can help keep your breasts moisturized and healthy:  Avoid using soap on your nipples.  Wear a supportive bra. Although not required, special nursing bras and tank tops are designed to allow access to your   breasts for breastfeeding without taking off your entire bra or top. Avoid wearing underwire-style bras or extremely tight bras.  Air dry your nipples for 3-4minutes after each feeding.  Use only cotton bra pads to absorb leaked breast milk. Leaking of breast milk between feedings  is normal.  Use lanolin on your nipples after breastfeeding. Lanolin helps to maintain your skin's normal moisture barrier. If you use pure lanolin, you do not need to wash it off before feeding your baby again. Pure lanolin is not toxic to your baby. You may also hand express a few drops of breast milk and gently massage that milk into your nipples and allow the milk to air dry. In the first few weeks after giving birth, some women experience extremely full breasts (engorgement). Engorgement can make your breasts feel heavy, warm, and tender to the touch. Engorgement peaks within 3-5 days after you give birth. The following recommendations can help ease engorgement:  Completely empty your breasts while breastfeeding or pumping. You may want to start by applying warm, moist heat (in the shower or with warm water-soaked hand towels) just before feeding or pumping. This increases circulation and helps the milk flow. If your baby does not completely empty your breasts while breastfeeding, pump any extra milk after he or she is finished.  Wear a snug bra (nursing or regular) or tank top for 1-2 days to signal your body to slightly decrease milk production.  Apply ice packs to your breasts, unless this is too uncomfortable for you.  Make sure that your baby is latched on and positioned properly while breastfeeding. If engorgement persists after 48 hours of following these recommendations, contact your health care provider or a lactation consultant. OVERALL HEALTH CARE RECOMMENDATIONS WHILE BREASTFEEDING  Eat healthy foods. Alternate between meals and snacks, eating 3 of each per day. Because what you eat affects your breast milk, some of the foods may make your baby more irritable than usual. Avoid eating these foods if you are sure that they are negatively affecting your baby.  Drink milk, fruit juice, and water to satisfy your thirst (about 10 glasses a day).  Rest often, relax, and continue to take  your prenatal vitamins to prevent fatigue, stress, and anemia.  Continue breast self-awareness checks.  Avoid chewing and smoking tobacco. Chemicals from cigarettes that pass into breast milk and exposure to secondhand smoke may harm your baby.  Avoid alcohol and drug use, including marijuana. Some medicines that may be harmful to your baby can pass through breast milk. It is important to ask your health care provider before taking any medicine, including all over-the-counter and prescription medicine as well as vitamin and herbal supplements. It is possible to become pregnant while breastfeeding. If birth control is desired, ask your health care provider about options that will be safe for your baby. SEEK MEDICAL CARE IF:  You feel like you want to stop breastfeeding or have become frustrated with breastfeeding.  You have painful breasts or nipples.  Your nipples are cracked or bleeding.  Your breasts are red, tender, or warm.  You have a swollen area on either breast.  You have a fever or chills.  You have nausea or vomiting.  You have drainage other than breast milk from your nipples.  Your breasts do not become full before feedings by the fifth day after you give birth.  You feel sad and depressed.  Your baby is too sleepy to eat well.  Your baby is having trouble sleeping.     Your baby is wetting less than 3 diapers in a 24-hour period.  Your baby has less than 3 stools in a 24-hour period.  Your baby's skin or the white part of his or her eyes becomes yellow.   Your baby is not gaining weight by 5 days of age. SEEK IMMEDIATE MEDICAL CARE IF:  Your baby is overly tired (lethargic) and does not want to wake up and feed.  Your baby develops an unexplained fever.   This information is not intended to replace advice given to you by your health care provider. Make sure you discuss any questions you have with your health care provider.   Document Released: 03/23/2005  Document Revised: 12/12/2014 Document Reviewed: 09/14/2012 Elsevier Interactive Patient Education 2016 [employer]  

## 2015-04-07 NOTE — L&D Delivery Note (Signed)
Operative Delivery Note At 12:46 AM a viable female was delivered via Vaginal, Vacuum [employer], operational).  Presentation: vertex; Position: Left,, Occiput,, Anterior; Station: +2.  Verbal consent: obtained from patient.  Risks and benefits discussed in detail.  Risks include, but are not limited to the risks of anesthesia, bleeding, infection, damage to maternal tissues, fetal cephalhematoma.  There is also the risk of inability to effect vaginal delivery of the head, or shoulder dystocia that cannot be resolved by established maneuvers, leading to the need for emergency cesarean section.  5 pulls with Kiwi vacuum extractor with good descent with each pull.  No pop-offs.  With each contraction, the extractor pressure was increased to the green zone and discontinued at the end of the contraction.    APGAR: 8, 9; weight  .   Placenta status: Intact, Spontaneous.   Cord: 3 vessels with the following complications: None.  Cord pH: not assessed  Anesthesia: Epidural  Instruments: correct x2 Episiotomy: None Lacerations:  1st degree, right labial Suture Repair: 3.0 vicryl Est. Blood Loss (mL):  430  Mom to postpartum.  Baby to Couplet care / Skin to Skin.  Rosmery Duggin JEHIEL 06/01/2015, 1:13 AM

## 2015-04-18 ENCOUNTER — Ambulatory Visit (INDEPENDENT_AMBULATORY_CARE_PROVIDER_SITE_OTHER): Payer: Medicaid Other | Admitting: Obstetrics & Gynecology

## 2015-04-18 VITALS — BP 116/69 | HR 92 | Temp 98.2°F | Wt 132.0 lb

## 2015-04-18 DIAGNOSIS — F1120 Opioid dependence, uncomplicated: Secondary | ICD-10-CM | POA: Diagnosis not present

## 2015-04-18 DIAGNOSIS — O99323 Drug use complicating pregnancy, third trimester: Secondary | ICD-10-CM

## 2015-04-18 DIAGNOSIS — F418 Other specified anxiety disorders: Secondary | ICD-10-CM | POA: Diagnosis not present

## 2015-04-18 DIAGNOSIS — O0993 Supervision of high risk pregnancy, unspecified, third trimester: Secondary | ICD-10-CM

## 2015-04-18 DIAGNOSIS — O99343 Other mental disorders complicating pregnancy, third trimester: Secondary | ICD-10-CM | POA: Diagnosis not present

## 2015-04-18 LAB — POCT URINALYSIS DIP (DEVICE)
Glucose, UA: NEGATIVE mg/dL
HGB URINE DIPSTICK: NEGATIVE
KETONES UR: NEGATIVE mg/dL
LEUKOCYTES UA: NEGATIVE
NITRITE: NEGATIVE
PH: 7 (ref 5.0–8.0)
Protein, ur: NEGATIVE mg/dL
Specific Gravity, Urine: 1.02 (ref 1.005–1.030)
Urobilinogen, UA: 1 mg/dL (ref 0.0–1.0)

## 2015-04-18 NOTE — Patient Instructions (Addendum)
Third Trimester of Pregnancy The third trimester is from week 29 through week 42, months 7 through 9. The third trimester is a time when the fetus is growing rapidly. At the end of the ninth month, the fetus is about 20 inches in length and weighs 6-10 pounds.  BODY CHANGES Your body goes through many changes during pregnancy. The changes vary from woman to woman.   Your weight will continue to increase. You can expect to gain 25-35 pounds (11-16 kg) by the end of the pregnancy.  You may begin to get stretch marks on your hips, abdomen, and breasts.  You may urinate more often because the fetus is moving lower into your pelvis and pressing on your bladder.  You may develop or continue to have heartburn as a result of your pregnancy.  You may develop constipation because certain hormones are causing the muscles that push waste through your intestines to slow down.  You may develop hemorrhoids or swollen, bulging veins (varicose veins).  You may have pelvic pain because of the weight gain and pregnancy hormones relaxing your joints between the bones in your pelvis. Backaches may result from overexertion of the muscles supporting your posture.  You may have changes in your hair. These can include thickening of your hair, rapid growth, and changes in texture. Some women also have hair loss during or after pregnancy, or hair that feels dry or thin. Your hair will most likely return to normal after your baby is born.  Your breasts will continue to grow and be tender. A yellow discharge may leak from your breasts called colostrum.  Your belly button may stick out.  You may feel short of breath because of your expanding uterus.  You may notice the fetus "dropping," or moving lower in your abdomen.  You may have a bloody mucus discharge. This usually occurs a few days to a week before labor begins.  Your cervix becomes thin and soft (effaced) near your due date. WHAT TO EXPECT AT YOUR PRENATAL  EXAMS  You will have prenatal exams every 2 weeks until week 36. Then, you will have weekly prenatal exams. During a routine prenatal visit:  You will be weighed to make sure you and the fetus are growing normally.  Your blood pressure is taken.  Your abdomen will be measured to track your baby's growth.  The fetal heartbeat will be listened to.  Any test results from the previous visit will be discussed.  You may have a cervical check near your due date to see if you have effaced. At around 36 weeks, your caregiver will check your cervix. At the same time, your caregiver will also perform a test on the secretions of the vaginal tissue. This test is to determine if a type of bacteria, Group B streptococcus, is present. Your caregiver will explain this further. Your caregiver may ask you:  What your birth plan is.  How you are feeling.  If you are feeling the baby move.  If you have had any abnormal symptoms, such as leaking fluid, bleeding, severe headaches, or abdominal cramping.  If you are using any tobacco products, including cigarettes, chewing tobacco, and electronic cigarettes.  If you have any questions. Other tests or screenings that may be performed during your third trimester include:  Blood tests that check for low iron levels (anemia).  Fetal testing to check the health, activity level, and growth of the fetus. Testing is done if you have certain medical conditions or if   there are problems during the pregnancy.  HIV (human immunodeficiency virus) testing. If you are at high risk, you may be screened for HIV during your third trimester of pregnancy. FALSE LABOR You may feel small, irregular contractions that eventually go away. These are called Braxton Hicks contractions, or false labor. Contractions may last for hours, days, or even weeks before true labor sets in. If contractions come at regular intervals, intensify, or become painful, it is best to be seen by your  caregiver.  SIGNS OF LABOR   Menstrual-like cramps.  Contractions that are 5 minutes apart or less.  Contractions that start on the top of the uterus and spread down to the lower abdomen and back.  A sense of increased pelvic pressure or back pain.  A watery or bloody mucus discharge that comes from the vagina. If you have any of these signs before the 37th week of pregnancy, call your caregiver right away. You need to go to the hospital to get checked immediately. HOME CARE INSTRUCTIONS   Avoid all smoking, herbs, alcohol, and unprescribed drugs. These chemicals affect the formation and growth of the baby.  Do not use any tobacco products, including cigarettes, chewing tobacco, and electronic cigarettes. If you need help quitting, ask your health care provider. You may receive counseling support and other resources to help you quit.  Follow your caregiver's instructions regarding medicine use. There are medicines that are either safe or unsafe to take during pregnancy.  Exercise only as directed by your caregiver. Experiencing uterine cramps is a good sign to stop exercising.  Continue to eat regular, healthy meals.  Wear a good support bra for breast tenderness.  Do not use hot tubs, steam rooms, or saunas.  Wear your seat belt at all times when driving.  Avoid raw meat, uncooked cheese, cat litter boxes, and soil used by cats. These carry germs that can cause birth defects in the baby.  Take your prenatal vitamins.  Take 1500-2000 mg of calcium daily starting at the 20th week of pregnancy until you deliver your baby.  Try taking a stool softener (if your caregiver approves) if you develop constipation. Eat more high-fiber foods, such as fresh vegetables or fruit and whole grains. Drink plenty of fluids to keep your urine clear or pale yellow.  Take warm sitz baths to soothe any pain or discomfort caused by hemorrhoids. Use hemorrhoid cream if your caregiver approves.  If  you develop varicose veins, wear support hose. Elevate your feet for 15 minutes, 3-4 times a day. Limit salt in your diet.  Avoid heavy lifting, wear low heal shoes, and practice good posture.  Rest a lot with your legs elevated if you have leg cramps or low back pain.  Visit your dentist if you have not gone during your pregnancy. Use a soft toothbrush to brush your teeth and be gentle when you floss.  A sexual relationship may be continued unless your caregiver directs you otherwise.  Do not travel far distances unless it is absolutely necessary and only with the approval of your caregiver.  Take prenatal classes to understand, practice, and ask questions about the labor and delivery.  Make a trial run to the hospital.  Pack your hospital bag.  Prepare the baby's nursery.  Continue to go to all your prenatal visits as directed by your caregiver. SEEK MEDICAL CARE IF:  You are unsure if you are in labor or if your water has broken.  You have dizziness.  You have   mild pelvic cramps, pelvic pressure, or nagging pain in your abdominal area.  You have persistent nausea, vomiting, or diarrhea.  You have a bad smelling vaginal discharge.  You have pain with urination. SEEK IMMEDIATE MEDICAL CARE IF:   You have a fever.  You are leaking fluid from your vagina.  You have spotting or bleeding from your vagina.  You have severe abdominal cramping or pain.  You have rapid weight loss or gain.  You have shortness of breath with chest pain.  You notice sudden or extreme swelling of your face, hands, ankles, feet, or legs.  You have not felt your baby move in over an hour.  You have severe headaches that do not go away with medicine.  You have vision changes.   This information is not intended to replace advice given to you by your health care provider. Make sure you discuss any questions you have with your health care provider.   Document Released: 03/17/2001 Document  Revised: 04/13/2014 Document Reviewed: 05/24/2012 Elsevier Interactive Patient Education 2016 Elsevier Inc[employer] Back Pain in Pregnancy Back pain during pregnancy is common. It happens in about half of all pregnancies. It is important for you and your baby that you remain active during your pregnancy.If you feel that back pain is not allowing you to remain active or sleep well, it is time to see your caregiver. Back pain may be caused by several factors related to changes during your pregnancy.Fortunately, unless you had trouble with your back before your pregnancy, the pain is likely to get better after you deliver. Low back pain usually occurs between the fifth and seventh months of pregnancy. It can, however, happen in the first couple months. Factors that increase the risk of back problems include:   Previous back problems.  Injury to your back.  Having twins or multiple births.  A chronic cough.  Stress.  Job-related repetitive motions.  Muscle or spinal disease in the back.  Family history of back problems, ruptured (herniated) discs, or osteoporosis.  Depression, anxiety, and panic attacks. CAUSES   When you are pregnant, your body produces a hormone called relaxin. This hormonemakes the ligaments connecting the low back and pubic bones more flexible. This flexibility allows the baby to be delivered more easily. When your ligaments are loose, your muscles need to work harder to support your back. Soreness in your back can come from tired muscles. Soreness can also come from back tissues that are irritated since they are receiving less support.  As the baby grows, it puts pressure on the nerves and blood vessels in your pelvis. This can cause back pain.  As the baby grows and gets heavier during pregnancy, the uterus pushes the stomach muscles forward and changes your center of gravity. This makes your back muscles work harder to maintain good posture. SYMPTOMS  Lumbar pain during  pregnancy Lumbar pain during pregnancy usually occurs at or above the waist in the center of the back. There may be pain and numbness that radiates into your leg or foot. This is similar to low back pain experienced by non-pregnant women. It usually increases with sitting for long periods of time, standing, or repetitive lifting. Tenderness may also be present in the muscles along your upper back. Posterior pelvic pain during pregnancy Pain in the back of the pelvis is more common than lumbar pain in pregnancy. It is a deep pain felt in your side at the waistline, or across the tailbone (sacrum), or in both places.   You may have pain on one or both sides. This pain can also go into the buttocks and backs of the upper thighs. Pubic and groin pain may also be present. The pain does not quickly resolve with rest, and morning stiffness may also be present. Pelvic pain during pregnancy can be brought on by most activities. A high level of fitness before and during pregnancy may or may not prevent this problem. Labor pain is usually 1 to 2 minutes apart, lasts for about 1 minute, and involves a bearing down feeling or pressure in your pelvis. However, if you are at term with the pregnancy, constant low back pain can be the beginning of early labor, and you should be aware of this. DIAGNOSIS  X-rays of the back should not be done during the first 12 to 14 weeks of the pregnancy and only when absolutely necessary during the rest of the pregnancy. MRIs do not give off radiation and are safe during pregnancy. MRIs also should only be done when absolutely necessary. HOME CARE INSTRUCTIONS  Exercise as directed by your caregiver. Exercise is the most effective way to prevent or manage back pain. If you have a back problem, it is especially important to avoid sports that require sudden body movements. Swimming and walking are great activities.  Do not stand in one place for long periods of time.  Do not wear high  heels.  Sit in chairs with good posture. Use a pillow on your lower back if necessary. Make sure your head rests over your shoulders and is not hanging forward.  Try sleeping on your side, preferably the left side, with a pillow or two between your legs. If you are sore after a night's rest, your bedmay betoo soft.Try placing a board between your mattress and box spring.  Listen to your body when lifting.If you are experiencing pain, ask for help or try bending yourknees more so you can use your leg muscles rather than your back muscles. Squat down when picking up something from the floor. Do not bend over.  Eat a healthy diet. Try to gain weight within your caregiver's recommendations.  Use heat or cold packs 3 to 4 times a day for 15 minutes to help with the pain.  Only take over-the-counter or prescription medicines for pain, discomfort, or fever as directed by your caregiver. Sudden (acute) back pain  Use bed rest for only the most extreme, acute episodes of back pain. Prolonged bed rest over 48 hours will aggravate your condition.  Ice is very effective for acute conditions.  Put ice in a plastic bag.  Place a towel between your skin and the bag.  Leave the ice on for 10 to 20 minutes every 2 hours, or as needed.  Using heat packs for 30 minutes prior to activities is also helpful. Continued back pain See your caregiver if you have continued problems. Your caregiver can help or refer you for appropriate physical therapy. With conditioning, most back problems can be avoided. Sometimes, a more serious issue may be the cause of back pain. You should be seen right away if new problems seem to be developing. Your caregiver may recommend:  A maternity girdle.  An elastic sling.  A back brace.  A massage therapist or acupuncture. SEEK MEDICAL CARE IF:   You are not able to do most of your daily activities, even when taking the pain medicine you were given.  You need a  referral to a physical therapist or chiropractor.    You want to try acupuncture. SEEK IMMEDIATE MEDICAL CARE IF:  You develop numbness, tingling, weakness, or problems with the use of your arms or legs.  You develop severe back pain that is no longer relieved with medicines.  You have a sudden change in bowel or bladder control.  You have increasing pain in other areas of the body.  You develop shortness of breath, dizziness, or fainting.  You develop nausea, vomiting, or sweating.  You have back pain which is similar to labor pains.  You have back pain along with your water breaking or vaginal bleeding.  You have back pain or numbness that travels down your leg.  Your back pain developed after you fell.  You develop pain on one side of your back. You may have a kidney stone.  You see blood in your urine. You may have a bladder infection or kidney stone.  You have back pain with blisters. You may have shingles. Back pain is fairly common during pregnancy but should not be accepted as just part of the process. Back pain should always be treated as soon as possible. This will make your pregnancy as pleasant as possible.   This information is not intended to replace advice given to you by your health care provider. Make sure you discuss any questions you have with your health care provider.   Document Released: 07/01/2005 Document Revised: 06/15/2011 Document Reviewed: 08/12/2010 Elsevier Interactive Patient Education 2016 Elsevier Inc.  

## 2015-04-18 NOTE — Progress Notes (Signed)
Subjective:US f/u in 1 week  Olivia Faulkner is a 30 y.o. G2P0010 at 2083w2d being seen today for ongoing prenatal care.  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  Patient reports backache.  Contractions: Not present. Vag. Bleeding: None.  Movement: Present. Denies leaking of fluid.   The following portions of the patient's history were reviewed and updated as appropriate: allergies, current medications, past family history, past medical history, past social history, past surgical history and problem list. Problem list updated.  Objective:   Filed Vitals:   04/18/15 1055  BP: 116/69  Pulse: 92  Temp: 98.2 F (36.8 C)  Weight: 132 lb (59.875 kg)    Fetal Status: Fetal Heart Rate (bpm): 135   Movement: Present     General:  Alert, oriented and cooperative. Patient is in no acute distress.  Skin: Skin is warm and dry. No rash noted.   Cardiovascular: Normal heart rate noted  Respiratory: Normal respiratory effort, no problems with respiration noted  Abdomen: Soft, gravid, appropriate for gestational age. Pain/Pressure: Present     Pelvic: Vag. Bleeding: None     Cervical exam deferred        Extremities: Normal range of motion.  Edema: Trace  Mental Status: Normal mood and affect. Normal behavior. Normal judgment and thought content.   Urinalysis: Urine Protein: Negative Urine Glucose: Negative  Assessment and Plan:  Pregnancy: G2P0010 at 2683w2d  1. Supervision of high risk pregnancy, antepartum, third trimester Nl growth at last US and f/u is scheduled  2. Pregnancy complicated by Suboxone maintenance, antepartum, third trimester (HCC) Need to confirm current dose  Preterm labor symptoms  and general obstetric precautions including but not limited to vaginal bleeding, contractions, leaking of fluid and fetal movement were reviewed in detail with the patient. Please refer to After Visit Summary for other counseling recommendations.  Return in about 3 weeks (around 05/09/2015).   Adam PhenixJames G Arnold, MD

## 2015-04-18 NOTE — Progress Notes (Signed)
Breastfeeding tip of the week reviewed. 

## 2015-04-23 ENCOUNTER — Ambulatory Visit (HOSPITAL_COMMUNITY)
Admission: RE | Admit: 2015-04-23 | Discharge: 2015-04-23 | Disposition: A | Discharge status: Home / Self Care | Payer: Medicaid Other | Source: Ambulatory Visit | Attending: Obstetrics & Gynecology | Admitting: Obstetrics & Gynecology

## 2015-04-23 ENCOUNTER — Encounter (HOSPITAL_COMMUNITY): Payer: Self-pay

## 2015-04-23 DIAGNOSIS — F1120 Opioid dependence, uncomplicated: Secondary | ICD-10-CM | POA: Diagnosis not present

## 2015-04-23 DIAGNOSIS — O99320 Drug use complicating pregnancy, unspecified trimester: Secondary | ICD-10-CM | POA: Insufficient documentation

## 2015-05-02 ENCOUNTER — Ambulatory Visit (INDEPENDENT_AMBULATORY_CARE_PROVIDER_SITE_OTHER): Payer: Medicaid Other | Admitting: Family

## 2015-05-02 ENCOUNTER — Encounter (HOSPITAL_COMMUNITY): Payer: Self-pay

## 2015-05-02 ENCOUNTER — Other Ambulatory Visit: Payer: Self-pay | Admitting: Family Medicine

## 2015-05-02 ENCOUNTER — Other Ambulatory Visit (HOSPITAL_COMMUNITY)
Admission: RE | Admit: 2015-05-02 | Discharge: 2015-05-02 | Disposition: A | Discharge status: Home / Self Care | Payer: Medicaid Other | Source: Ambulatory Visit | Attending: Family | Admitting: Family

## 2015-05-02 VITALS — BP 129/74 | HR 101 | Wt 137.4 lb

## 2015-05-02 DIAGNOSIS — Z01411 Encounter for gynecological examination (general) (routine) with abnormal findings: Secondary | ICD-10-CM | POA: Insufficient documentation

## 2015-05-02 DIAGNOSIS — Z113 Encounter for screening for infections with a predominantly sexual mode of transmission: Secondary | ICD-10-CM

## 2015-05-02 DIAGNOSIS — O0993 Supervision of high risk pregnancy, unspecified, third trimester: Secondary | ICD-10-CM

## 2015-05-02 DIAGNOSIS — O99323 Drug use complicating pregnancy, third trimester: Secondary | ICD-10-CM

## 2015-05-02 DIAGNOSIS — Z124 Encounter for screening for malignant neoplasm of cervix: Secondary | ICD-10-CM

## 2015-05-02 DIAGNOSIS — F1120 Opioid dependence, uncomplicated: Secondary | ICD-10-CM | POA: Diagnosis not present

## 2015-05-02 DIAGNOSIS — R87610 Atypical squamous cells of undetermined significance on cytologic smear of cervix (ASC-US): Secondary | ICD-10-CM

## 2015-05-02 LAB — POCT URINALYSIS DIP (DEVICE)
BILIRUBIN URINE: NEGATIVE
GLUCOSE, UA: NEGATIVE mg/dL
Hgb urine dipstick: NEGATIVE
KETONES UR: NEGATIVE mg/dL
Nitrite: NEGATIVE
Protein, ur: NEGATIVE mg/dL
Specific Gravity, Urine: 1.015 (ref 1.005–1.030)
Urobilinogen, UA: 1 mg/dL (ref 0.0–1.0)
pH: 6.5 (ref 5.0–8.0)

## 2015-05-02 LAB — OB RESULTS CONSOLE GC/CHLAMYDIA: Gonorrhea: NEGATIVE

## 2015-05-02 LAB — OB RESULTS CONSOLE GBS: GBS: POSITIVE

## 2015-05-02 NOTE — Addendum Note (Signed)
Addended byGerome Apley on: 05/02/2015 12:09 PM   Modules accepted: Orders

## 2015-05-02 NOTE — Progress Notes (Signed)
NICU NAS tour scheduled for 05/09/15 at 12:00, prior to HR OB appointment.

## 2015-05-02 NOTE — Progress Notes (Signed)
Subjective:  Olivia Faulkner is a 30 y.o. G2P0010 at [redacted]w[redacted]d being seen today for ongoing prenatal care.  She is currently monitored for the following issues for this high-risk pregnancy and has anxiety and depression; Eczema; pregnancy complicated by Suboxone maintenance, antepartum (HCC); Supervision of high risk pregnancy, antepartum; Abnormal Pap smear of cervix; Drug use affecting pregnancy; and Anemia in pregnancy on her problem list.  Patient reports no complaints.  Contractions: Irritability. Vag. Bleeding: None.  Movement: Present. Denies leaking of fluid.   The following portions of the patient's history were reviewed and updated as appropriate: allergies, current medications, past family history, past medical history, past social history, past surgical history and problem list. Problem list updated.  Objective:   Filed Vitals:   05/02/15 1118  BP: 129/74  Pulse: 101  Weight: 137 lb 6.4 oz (62.324 kg)    Fetal Status: Fetal Heart Rate (bpm): 130 Fundal Height: 37 cm Movement: Present     General:  Alert, oriented and cooperative. Patient is in no acute distress.  Skin: Skin is warm and dry. No rash noted.   Cardiovascular: Normal heart rate noted  Respiratory: Normal respiratory effort, no problems with respiration noted  Abdomen: Soft, gravid, appropriate for gestational age. Pain/Pressure: Present     Pelvic: Vag. Bleeding: None     Cervical exam deferred      closed/thick; bleeding after collection of pap smear  Extremities: Normal range of motion.  Edema: Trace  Mental Status: Normal mood and affect. Normal behavior. Normal judgment and thought content.   Urinalysis: Urine Protein: Negative Urine Glucose: Negative  Assessment and Plan:  Pregnancy: G2P0010 at [redacted]w[redacted]d  1. Pregnancy complicated by Suboxone maintenance, antepartum, third trimester (HCC) - Reviewed growth ultrasound - NICU tour scheduled  2. Supervision of high risk pregnancy, antepartum, third trimester -  Culture, beta strep (group b only) - GC/Chlamydia probe amp ([place])not at [facility]   3. Atypical squamous cells of undetermined significance on cytologic smear of cervix ([facility]) - Pap smear collected for HPV testing  Preterm labor symptoms and general obstetric precautions including but not limited to vaginal bleeding, contractions, leaking of fluid and fetal movement were reviewed in detail with the patient. Please refer to After Visit Summary for other counseling recommendations.  Return in about 1 week (around 05/09/2015).   Eino Farber Kennith Gain, CNM

## 2015-05-02 NOTE — Progress Notes (Signed)
Message left with Devonne Doughty, NICU discharge planner and tour coordinator to set up tour for patient

## 2015-05-02 NOTE — Patient Instructions (Signed)
AREA PEDIATRIC/FAMILY PRACTICE PHYSICIANS  [facility] [address] [place], [place] [address] Phone - [phone]   Fax - [phone]  [facility] [address] [place], [place]  [address] Phone - [phone]   Fax - [phone]  [facility] [address] [place], [place]  [address] Phone - [phone]   Fax - [phone]   [facility] [address] [place], [place]  [address] Phone - [phone]   Fax - [phone]  [facility] [address] [place], [place]  [address] Phone - [phone]   Fax - [phone]  [facility] [address] [place], [place]  [address] Phone - [phone]   Fax - [phone]  [facility] [address] [place], [place]  [address] Phone - [phone]   Fax - [phone]  [facility] [address] [place], [place]  [address] Phone - [phone]   Fax - [phone]  [facility] [address] [place], [place]  [address] Phone - [phone]   Fax - [phone] [facility] [address] [place], [place]  [address] Phone - [phone]   Fax - [phone]  [facility] [address] [place], [place]  [address] Phone - [phone]   Fax - [phone]  [facility] [address] [place], [place]  [address] Phone - [phone]   Fax - [phone]  [facility] [address], [address] [place], [place]  [address] Phone - [phone]   Fax - [phone]  [facility] [address] [place], [place]  [address] Phone - [phone]  [facility] [address] [place], [place]  [address] Phone - [phone]   Fax - [phone]  [facility] [address] [place], [place]  [address] Phone - [phone]   Fax - [phone]  [facility] [address] [place], [place]  [address] Phone - [phone]   Fax - [phone]  [facility] [address] [place], [place]  [address] Phone - [phone]   Fax - [phone]   [facility] [address] [place], [place]  [address] Phone - [phone]   Fax - [phone]  [facility] [address] [place], [place]  [address] Phone - [phone]   Fax - [phone]  [facility] [address] [place], [place]  [address] Phone - [phone]   Fax - [phone]  [facility] [address] [place], [place]  [address] Phone - [phone]   Fax - [phone]  [facility] [address] , [place]  [address] Phone - [phone]   Fax - [phone] [facility] [address] [place], [place]  [address] Phone - [phone]   Fax - [phone]  [facility] [address] [place], [place]  [address] Phone - [phone]   Fax - [phone]  [facility] [address] [place], [place]  [address] Phone - [phone]   Fax - [phone]   

## 2015-05-03 LAB — GC/CHLAMYDIA PROBE AMP (~~LOC~~) NOT AT ARMC
CHLAMYDIA, DNA PROBE: NEGATIVE
NEISSERIA GONORRHEA: NEGATIVE

## 2015-05-03 LAB — CULTURE, BETA STREP (GROUP B ONLY)

## 2015-05-06 LAB — CYTOLOGY - PAP

## 2015-05-09 ENCOUNTER — Encounter: Payer: Self-pay | Admitting: Obstetrics and Gynecology

## 2015-05-09 ENCOUNTER — Ambulatory Visit (INDEPENDENT_AMBULATORY_CARE_PROVIDER_SITE_OTHER): Payer: Medicaid Other | Admitting: Obstetrics and Gynecology

## 2015-05-09 ENCOUNTER — Encounter (HOSPITAL_COMMUNITY): Payer: Self-pay

## 2015-05-09 VITALS — BP 126/70 | HR 98 | Temp 98.6°F | Wt 139.0 lb

## 2015-05-09 DIAGNOSIS — F1120 Opioid dependence, uncomplicated: Secondary | ICD-10-CM

## 2015-05-09 DIAGNOSIS — O99323 Drug use complicating pregnancy, third trimester: Secondary | ICD-10-CM | POA: Diagnosis not present

## 2015-05-09 DIAGNOSIS — R87612 Low grade squamous intraepithelial lesion on cytologic smear of cervix (LGSIL): Secondary | ICD-10-CM

## 2015-05-09 LAB — POCT URINALYSIS DIP (DEVICE)
Bilirubin Urine: NEGATIVE
Glucose, UA: NEGATIVE mg/dL
Hgb urine dipstick: NEGATIVE
Ketones, ur: NEGATIVE mg/dL
NITRITE: NEGATIVE
PH: 7 (ref 5.0–8.0)
PROTEIN: NEGATIVE mg/dL
Specific Gravity, Urine: 1.015 (ref 1.005–1.030)
Urobilinogen, UA: 1 mg/dL (ref 0.0–1.0)

## 2015-05-09 NOTE — Progress Notes (Signed)
Subjective:  Olivia Faulkner is a 30 y.o. G2P0010 at [redacted]w[redacted]d being seen today for ongoing prenatal care.  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  Patient reports no complaints.  Contractions: Not present.  .  Movement: Present. Denies leaking of fluid.   The following portions of the patient's history were reviewed and updated as appropriate: allergies, current medications, past family history, past medical history, past social history, past surgical history and problem list. Problem list updated.  Objective:   Filed Vitals:   05/09/15 1251  BP: 126/70  Pulse: 98  Temp: 98.6 F (37 C)  Weight: 139 lb (63.05 kg)    Fetal Status: Fetal Heart Rate (bpm): 135   Movement: Present     General:  Alert, oriented and cooperative. Patient is in no acute distress.  Skin: Skin is warm and dry. No rash noted.   Cardiovascular: Normal heart rate noted  Respiratory: Normal respiratory effort, no problems with respiration noted  Abdomen: Soft, gravid, appropriate for gestational age. Pain/Pressure: Present     Pelvic:       Cervical exam deferred        Extremities: Normal range of motion.     Mental Status: Normal mood and affect. Normal behavior. Normal judgment and thought content.   Urinalysis:      Assessment and Plan:  Pregnancy: G2P0010 at [redacted]w[redacted]d  1. Low grade squamous intraepith lesion on cytologic smear cervix (lgsil) - colpo postpartum, discussed w/ patient  2. Suboxone maintenance - stable  3. Pregnancy - gbs positive, confirmed on antibiotic allergy  Term labor symptoms and general obstetric precautions including but not limited to vaginal  bleeding, contractions, leaking of fluid and fetal movement were reviewed in detail with the patient. Please refer to After Visit Summary for other counseling recommendations.   Kathrynn Running, MD

## 2015-05-09 NOTE — Progress Notes (Signed)
NICU NAS tour completed by NNP, Clementeen Hoof. Questions answered and brochure given.

## 2015-05-16 ENCOUNTER — Ambulatory Visit (INDEPENDENT_AMBULATORY_CARE_PROVIDER_SITE_OTHER): Payer: Medicaid Other | Admitting: Obstetrics & Gynecology

## 2015-05-16 ENCOUNTER — Encounter: Payer: Self-pay | Admitting: General Practice

## 2015-05-16 VITALS — BP 132/75 | HR 84 | Temp 98.3°F | Wt 141.0 lb

## 2015-05-16 DIAGNOSIS — F1120 Opioid dependence, uncomplicated: Secondary | ICD-10-CM

## 2015-05-16 DIAGNOSIS — O99323 Drug use complicating pregnancy, third trimester: Secondary | ICD-10-CM | POA: Diagnosis not present

## 2015-05-16 LAB — POCT URINALYSIS DIP (DEVICE)
Bilirubin Urine: NEGATIVE
Glucose, UA: NEGATIVE mg/dL
HGB URINE DIPSTICK: NEGATIVE
Ketones, ur: NEGATIVE mg/dL
NITRITE: NEGATIVE
PH: 7 (ref 5.0–8.0)
PROTEIN: NEGATIVE mg/dL
SPECIFIC GRAVITY, URINE: 1.015 (ref 1.005–1.030)
UROBILINOGEN UA: 0.2 mg/dL (ref 0.0–1.0)

## 2015-05-16 NOTE — Patient Instructions (Signed)
Vaginal Delivery °During delivery, your health care provider will help you give birth to your baby. During a vaginal delivery, you will work to push the baby out of your vagina. However, before you can push your baby out, a few things need to happen. The opening of your uterus (cervix) has to soften, thin out, and open up (dilate) all the way to 10 cm. Also, your baby has to move down from the uterus into your vagina.  °SIGNS OF LABOR  °Your health care provider will first need to make sure you are in labor. Signs of labor include:  °· Passing what is called the mucous plug before labor begins. This is a small amount of blood-stained mucus. °· Having regular, painful uterine contractions.   °· The time between contractions gets shorter.   °· The discomfort and pain gradually get more intense. °· Contraction pains get worse when walking and do not go away when resting.   °· Your cervix becomes thinner (effacement) and dilates. °BEFORE THE DELIVERY °Once you are in labor and admitted into the hospital or care center, your health care provider may do the following:  °· Perform a complete physical exam. °· Review any complications related to pregnancy or labor.  °· Check your blood pressure, pulse, temperature, and heart rate (vital signs).   °· Determine if, and when, the rupture of amniotic membranes occurred. °· Do a vaginal exam (using a sterile glove and lubricant) to determine:   °¨ The position (presentation) of the baby. Is the baby's head presenting first (vertex) in the birth canal (vagina), or are the feet or buttocks first (breech)?   °¨ The level (station) of the baby's head within the birth canal.   °¨ The effacement and dilatation of the cervix.   °· An electronic fetal monitor is usually placed on your abdomen when you first arrive. This is used to monitor your contractions and the baby's heart rate. °¨ When the monitor is on your abdomen (external fetal monitor), it can only pick up the frequency and  length of your contractions. It cannot tell the strength of your contractions. °¨ If it becomes necessary for your health care provider to know exactly how strong your contractions are or to see exactly what the baby's heart rate is doing, an internal monitor may be inserted into your vagina and uterus. Your health care provider will discuss the benefits and risks of using an internal monitor and obtain your permission before inserting the device. °¨ Continuous fetal monitoring may be needed if you have an epidural, are receiving certain medicines (such as oxytocin), or have pregnancy or labor complications. °· An IV access tube may be placed into a vein in your arm to deliver fluids and medicines if necessary. °THREE STAGES OF LABOR AND DELIVERY °Normal labor and delivery is divided into three stages. °First Stage °This stage starts when you begin to contract regularly and your cervix begins to efface and dilate. It ends when your cervix is completely open (fully dilated). The first stage is the longest stage of labor and can last from 3 hours to 15 hours.  °Several methods are available to help with labor pain. You and your health care provider will decide which option is best for you. Options include:  °· Opioid medicines. These are strong pain medicines that you can get through your IV tube or as a shot into your muscle. These medicines lessen pain but do not make it go away completely.  °· Epidural. A medicine is given through a thin tube that   is inserted in your back. The medicine numbs the lower part of your body and prevents any pain in that area. °· Paracervical pain medicine. This is an injection of an anesthetic on each side of your cervix.   °· You may request natural childbirth, which does not involve the use of pain medicines or an epidural during labor and delivery. Instead, you will use other things, such as breathing exercises, to help cope with the pain. °Second Stage °The second stage of labor  begins when your cervix is fully dilated at 10 cm. It continues until you push your baby down through the birth canal and the baby is born. This stage can take only minutes or several hours. °· The location of your baby's head as it moves through the birth canal is reported as a number called a station. If the baby's head has not started its descent, the station is described as being at minus 3 (-3). When your baby's head is at the zero station, it is at the middle of the birth canal and is engaged in the pelvis. The station of your baby helps indicate the progress of the second stage of labor. °· When your baby is born, your health care provider may hold the baby with his or her head lowered to prevent amniotic fluid, mucus, and blood from getting into the baby's lungs. The baby's mouth and nose may be suctioned with a small bulb syringe to remove any additional fluid. °· Your health care provider may then place the baby on your stomach. It is important to keep the baby from getting cold. To do this, the health care provider will dry the baby off, place the baby directly on your skin (with no blankets between you and the baby), and cover the baby with warm, dry blankets.   °· The umbilical cord is cut. °Third Stage °During the third stage of labor, your health care provider will deliver the placenta (afterbirth) and make sure your bleeding is under control. The delivery of the placenta usually takes about 5 minutes but can take up to 30 minutes. After the placenta is delivered, a medicine may be given either by IV or injection to help contract the uterus and control bleeding. If you are planning to breastfeed, you can try to do so now. °After you deliver the placenta, your uterus should contract and get very firm. If your uterus does not remain firm, your health care provider will massage it. This is important because the contraction of the uterus helps cut off bleeding at the site where the placenta was attached  to your uterus. If your uterus does not contract properly and stay firm, you may continue to bleed heavily. If there is a lot of bleeding, medicines may be given to contract the uterus and stop the bleeding.  °  °This information is not intended to replace advice given to you by your health care provider. Make sure you discuss any questions you have with your health care provider. °  °Document Released: 12/31/2007 Document Revised: 04/13/2014 Document Reviewed: 11/18/2011 °Elsevier Interactive Patient Education ©2016 [employer] ° °

## 2015-05-16 NOTE — Progress Notes (Signed)
Reviewed tip of week with patient  

## 2015-05-16 NOTE — Progress Notes (Signed)
Subjective:  Olivia Faulkner is a 30 y.o. G2P0010 at [redacted]w[redacted]d being seen today for ongoing prenatal care.  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  Patient reports occasional contractions.  Contractions: Irritability. Vag. Bleeding: None.  Movement: Present. Denies leaking of fluid.   The following portions of the patient's history were reviewed and updated as appropriate: allergies, current medications, past family history, past medical history, past social history, past surgical history and problem list. Problem list updated.  Objective:   Filed Vitals:   05/16/15 1108  BP: 132/75  Pulse: 84  Temp: 98.3 F (36.8 C)  Weight: 141 lb (63.957 kg)    Fetal Status: Fetal Heart Rate (bpm): 137   Movement: Present     General:  Alert, oriented and cooperative. Patient is in no acute distress.  Skin: Skin is warm and dry. No rash noted.   Cardiovascular: Normal heart rate noted  Respiratory: Normal respiratory effort, no problems with respiration noted  Abdomen: Soft, gravid, appropriate for gestational age. Pain/Pressure: Present     Pelvic: Vag. Bleeding: None Vag D/C Character: White   Cervical exam deferred        Extremities: Normal range of motion.  Edema: Trace  Mental Status: Normal mood and affect. Normal behavior. Normal judgment and thought content.   Urinalysis:      Assessment and Plan:  Pregnancy: G2P0010 at 103w2d  1. Pregnancy complicated by Suboxone maintenance, antepartum, third trimester (HCC) stable  Term labor symptoms and general obstetric precautions including but not limited to vaginal bleeding, contractions, leaking of fluid and  fetal movement were reviewed in detail with the patient. Please refer to After Visit Summary for other counseling recommendations.  Return in about 1 week (around 05/23/2015).   Adam Phenix, MD

## 2015-05-23 ENCOUNTER — Ambulatory Visit (INDEPENDENT_AMBULATORY_CARE_PROVIDER_SITE_OTHER): Payer: Medicaid Other | Admitting: Family

## 2015-05-23 VITALS — BP 125/78 | HR 79 | Temp 99.0°F | Wt 142.9 lb

## 2015-05-23 DIAGNOSIS — O99323 Drug use complicating pregnancy, third trimester: Secondary | ICD-10-CM | POA: Diagnosis not present

## 2015-05-23 DIAGNOSIS — F1120 Opioid dependence, uncomplicated: Secondary | ICD-10-CM | POA: Diagnosis not present

## 2015-05-23 DIAGNOSIS — O0993 Supervision of high risk pregnancy, unspecified, third trimester: Secondary | ICD-10-CM

## 2015-05-23 LAB — POCT URINALYSIS DIP (DEVICE)
Bilirubin Urine: NEGATIVE
GLUCOSE, UA: NEGATIVE mg/dL
Hgb urine dipstick: NEGATIVE
Ketones, ur: NEGATIVE mg/dL
Nitrite: NEGATIVE
PROTEIN: NEGATIVE mg/dL
SPECIFIC GRAVITY, URINE: 1.015 (ref 1.005–1.030)
UROBILINOGEN UA: 0.2 mg/dL (ref 0.0–1.0)
pH: 6.5 (ref 5.0–8.0)

## 2015-05-23 NOTE — Progress Notes (Signed)
Subjective:  Olivia Faulkner is a 30 y.o. G2P0010 at [redacted]w[redacted]d being seen today for ongoing prenatal care.  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  Patient reports no complaints.  Contractions: Irritability. Vag. Bleeding: None.  Movement: Present. Denies leaking of fluid.   The following portions of the patient's history were reviewed and updated as appropriate: allergies, current medications, past family history, past medical history, past social history, past surgical history and problem list. Problem list updated.  Objective:   Filed Vitals:   05/23/15 1117  BP: 125/78  Pulse: 79  Temp: 99 F (37.2 C)  Weight: 142 lb 14.4 oz (64.819 kg)    Fetal Status: Fetal Heart Rate (bpm): 124   Movement: Present     General:  Alert, oriented and cooperative. Patient is in no acute distress.  Skin: Skin is warm and dry. No rash noted.   Cardiovascular: Normal heart rate noted  Respiratory: Normal respiratory effort, no problems with respiration noted  Abdomen: Soft, gravid, appropriate for gestational age. Pain/Pressure: Present     Pelvic: Vag. Bleeding: None     Cervical exam performed      0.5/thick  Extremities: Normal range of motion.     Mental Status: Normal mood and affect. Normal behavior. Normal judgment and thought content.   Urinalysis: Urine Protein: Negative Urine Glucose: Negative  Assessment and Plan:  Pregnancy: G2P0010 at [redacted]w[redacted]d  1. Supervision of high risk pregnancy, antepartum, third trimester - Reviewed GBS results and implications - NST next visit; Reviewed IOL at 41 wks  2. Pregnancy complicated by Suboxone maintenance, antepartum,  third trimester [facility]) - Maintain current dose - NICU tour 05/09/15  Term labor symptoms and general obstetric precautions including but not limited to vaginal bleeding, contractions, leaking of fluid and fetal movement were reviewed in detail with the patient. Please refer to After Visit Summary for other counseling recommendations.  Return for appt and NST.   Eino Farber Kennith Gain, CNM

## 2015-05-29 ENCOUNTER — Encounter: Payer: Self-pay | Admitting: *Deleted

## 2015-05-30 ENCOUNTER — Ambulatory Visit (INDEPENDENT_AMBULATORY_CARE_PROVIDER_SITE_OTHER): Payer: Medicaid Other | Admitting: Family

## 2015-05-30 ENCOUNTER — Encounter (HOSPITAL_COMMUNITY): Payer: Self-pay | Admitting: *Deleted

## 2015-05-30 ENCOUNTER — Inpatient Hospital Stay (HOSPITAL_COMMUNITY)
Admission: AD | Admit: 2015-05-30 | Discharge: 2015-06-02 | DRG: 775 | Disposition: A | Discharge status: Home / Self Care | Payer: Medicaid Other | Source: Ambulatory Visit | Attending: Family Medicine | Admitting: Family Medicine

## 2015-05-30 VITALS — BP 145/85 | HR 92 | Temp 97.0°F | Wt 143.7 lb

## 2015-05-30 DIAGNOSIS — F329 Major depressive disorder, single episode, unspecified: Secondary | ICD-10-CM | POA: Diagnosis present

## 2015-05-30 DIAGNOSIS — O99344 Other mental disorders complicating childbirth: Secondary | ICD-10-CM | POA: Diagnosis present

## 2015-05-30 DIAGNOSIS — O99824 Streptococcus B carrier state complicating childbirth: Secondary | ICD-10-CM | POA: Diagnosis present

## 2015-05-30 DIAGNOSIS — Z87891 Personal history of nicotine dependence: Secondary | ICD-10-CM

## 2015-05-30 DIAGNOSIS — O99343 Other mental disorders complicating pregnancy, third trimester: Secondary | ICD-10-CM

## 2015-05-30 DIAGNOSIS — F419 Anxiety disorder, unspecified: Secondary | ICD-10-CM | POA: Diagnosis present

## 2015-05-30 DIAGNOSIS — J45909 Unspecified asthma, uncomplicated: Secondary | ICD-10-CM | POA: Diagnosis present

## 2015-05-30 DIAGNOSIS — O99013 Anemia complicating pregnancy, third trimester: Secondary | ICD-10-CM

## 2015-05-30 DIAGNOSIS — Z3A40 40 weeks gestation of pregnancy: Secondary | ICD-10-CM | POA: Diagnosis not present

## 2015-05-30 DIAGNOSIS — O134 Gestational [pregnancy-induced] hypertension without significant proteinuria, complicating childbirth: Principal | ICD-10-CM | POA: Diagnosis present

## 2015-05-30 DIAGNOSIS — O99320 Drug use complicating pregnancy, unspecified trimester: Secondary | ICD-10-CM

## 2015-05-30 DIAGNOSIS — O0993 Supervision of high risk pregnancy, unspecified, third trimester: Secondary | ICD-10-CM

## 2015-05-30 DIAGNOSIS — O99324 Drug use complicating childbirth: Secondary | ICD-10-CM | POA: Diagnosis not present

## 2015-05-30 DIAGNOSIS — O9952 Diseases of the respiratory system complicating childbirth: Secondary | ICD-10-CM | POA: Diagnosis present

## 2015-05-30 DIAGNOSIS — F418 Other specified anxiety disorders: Secondary | ICD-10-CM

## 2015-05-30 DIAGNOSIS — F1120 Opioid dependence, uncomplicated: Secondary | ICD-10-CM

## 2015-05-30 DIAGNOSIS — Z3490 Encounter for supervision of normal pregnancy, unspecified, unspecified trimester: Secondary | ICD-10-CM

## 2015-05-30 DIAGNOSIS — O99323 Drug use complicating pregnancy, third trimester: Secondary | ICD-10-CM

## 2015-05-30 DIAGNOSIS — Z79899 Other long term (current) drug therapy: Secondary | ICD-10-CM

## 2015-05-30 DIAGNOSIS — D649 Anemia, unspecified: Secondary | ICD-10-CM

## 2015-05-30 HISTORY — DX: Personal history of nicotine dependence: Z87.891

## 2015-05-30 HISTORY — DX: Opioid dependence, uncomplicated: F11.20

## 2015-05-30 HISTORY — DX: Anxiety disorder, unspecified: F41.9

## 2015-05-30 HISTORY — DX: Cannabis use, unspecified, uncomplicated: F12.90

## 2015-05-30 HISTORY — DX: Drug use complicating pregnancy, unspecified trimester: O99.320

## 2015-05-30 HISTORY — DX: Gestational (pregnancy-induced) hypertension without significant proteinuria, unspecified trimester: O13.9

## 2015-05-30 HISTORY — DX: Unspecified abnormal cytological findings in specimens from vagina: R87.629

## 2015-05-30 LAB — CBC
HEMATOCRIT: 33.5 % — AB (ref 36.0–46.0)
Hemoglobin: 11.3 g/dL — ABNORMAL LOW (ref 12.0–15.0)
MCH: 29.7 pg (ref 26.0–34.0)
MCHC: 33.7 g/dL (ref 30.0–36.0)
MCV: 88.2 fL (ref 78.0–100.0)
Platelets: 218 10*3/uL (ref 150–400)
RBC: 3.8 MIL/uL — AB (ref 3.87–5.11)
RDW: 12.6 % (ref 11.5–15.5)
WBC: 12.7 10*3/uL — AB (ref 4.0–10.5)

## 2015-05-30 LAB — COMPREHENSIVE METABOLIC PANEL
ALBUMIN: 3 g/dL — AB (ref 3.5–5.0)
ALT: 10 U/L — AB (ref 14–54)
AST: 23 U/L (ref 15–41)
Alkaline Phosphatase: 230 U/L — ABNORMAL HIGH (ref 38–126)
Anion gap: 9 (ref 5–15)
BILIRUBIN TOTAL: 0.6 mg/dL (ref 0.3–1.2)
BUN: 6 mg/dL (ref 6–20)
CHLORIDE: 107 mmol/L (ref 101–111)
CO2: 22 mmol/L (ref 22–32)
CREATININE: 0.57 mg/dL (ref 0.44–1.00)
Calcium: 8.9 mg/dL (ref 8.9–10.3)
GFR calc Af Amer: >60 mL/min (ref >60)
GLUCOSE: 87 mg/dL (ref 65–99)
POTASSIUM: 3.9 mmol/L (ref 3.5–5.1)
Sodium: 138 mmol/L (ref 135–145)
Total Protein: 6.6 g/dL (ref 6.5–8.1)

## 2015-05-30 LAB — POCT URINALYSIS DIP (DEVICE)
Bilirubin Urine: NEGATIVE
GLUCOSE, UA: NEGATIVE mg/dL
Hgb urine dipstick: NEGATIVE
KETONES UR: NEGATIVE mg/dL
LEUKOCYTES UA: NEGATIVE
Nitrite: NEGATIVE
Protein, ur: NEGATIVE mg/dL
SPECIFIC GRAVITY, URINE: 1.02 (ref 1.005–1.030)
UROBILINOGEN UA: 0.2 mg/dL (ref 0.0–1.0)
pH: 7 (ref 5.0–8.0)

## 2015-05-30 LAB — TYPE AND SCREEN
ABO/RH(D): B POS
Antibody Screen: NEGATIVE

## 2015-05-30 LAB — PROTEIN / CREATININE RATIO, URINE
CREATININE, URINE: 85 mg/dL
Protein Creatinine Ratio: 0.16 mg/mg{Cre} — ABNORMAL HIGH (ref 0.00–0.15)
Total Protein, Urine: 14 mg/dL

## 2015-05-30 LAB — ABO/RH: ABO/RH(D): B POS

## 2015-05-30 MED ORDER — PRENATAL PLUS 27-1 MG PO TABS: 1.0000 | ORAL_TABLET | Freq: Every day | ORAL | Status: DC

## 2015-05-30 MED ORDER — MISOPROSTOL 25 MCG QUARTER TABLET
25.0000 ug | ORAL_TABLET | ORAL | Status: DC | PRN
  Filled 2015-05-30: qty 0.25

## 2015-05-30 MED ORDER — FLEET ENEMA 7-19 GM/118ML RE ENEM: 1.0000 | ENEMA | RECTAL | Status: DC | PRN

## 2015-05-30 MED ORDER — LIDOCAINE HCL (PF) 1 % IJ SOLN
30.0000 mL | INTRAMUSCULAR | Status: DC | PRN
Start: 2015-05-30 — End: 2015-06-01
  Filled 2015-05-30: qty 30

## 2015-05-30 MED ORDER — OXYTOCIN 10 UNIT/ML IJ SOLN: 2.5000 [IU]/h | INTRAVENOUS | Status: DC

## 2015-05-30 MED ORDER — PENICILLIN G POTASSIUM 5000000 UNITS IJ SOLR
2.5000 10*6.[IU] | INTRAVENOUS | Status: DC
  Administered 2015-05-31 (×5): 2.5 10*6.[IU] via INTRAVENOUS
  Filled 2015-05-30 (×12): qty 2.5

## 2015-05-30 MED ORDER — OXYTOCIN 10 UNIT/ML IJ SOLN
1.0000 m[IU]/min | INTRAVENOUS | Status: DC
  Administered 2015-05-31: 2 m[IU]/min via INTRAVENOUS
  Filled 2015-05-30: qty 4

## 2015-05-30 MED ORDER — OXYTOCIN BOLUS FROM INFUSION: 500.0000 mL | INTRAVENOUS | Status: DC

## 2015-05-30 MED ORDER — BUPRENORPHINE HCL-NALOXONE HCL 4-1 MG SL FILM: 2.0000 mg | ORAL_FILM | Freq: Every day | SUBLINGUAL | Status: DC

## 2015-05-30 MED ORDER — ALBUTEROL SULFATE HFA 108 (90 BASE) MCG/ACT IN AERS: 2.0000 | INHALATION_SPRAY | Freq: Four times a day (QID) | RESPIRATORY_TRACT | Status: DC | PRN

## 2015-05-30 MED ORDER — BUPRENORPHINE HCL 2 MG SL SUBL
2.0000 mg | SUBLINGUAL_TABLET | SUBLINGUAL | Status: DC
  Administered 2015-05-31 – 2015-06-02 (×3): 2 mg via SUBLINGUAL
  Filled 2015-05-30 (×3): qty 1

## 2015-05-30 MED ORDER — LACTATED RINGERS IV SOLN
INTRAVENOUS | Status: DC
Start: 2015-05-30 — End: 2015-06-01
  Administered 2015-05-30 – 2015-05-31 (×3): via INTRAVENOUS

## 2015-05-30 MED ORDER — ALBUTEROL SULFATE (2.5 MG/3ML) 0.083% IN NEBU: 2.5000 mg | INHALATION_SOLUTION | Freq: Four times a day (QID) | RESPIRATORY_TRACT | Status: DC | PRN

## 2015-05-30 MED ORDER — FENTANYL CITRATE (PF) 100 MCG/2ML IJ SOLN: 50.0000 ug | Freq: Once | INTRAMUSCULAR | Status: DC

## 2015-05-30 MED ORDER — LACTATED RINGERS IV SOLN: 500.0000 mL | INTRAVENOUS | Status: DC | PRN

## 2015-05-30 MED ORDER — TERBUTALINE SULFATE 1 MG/ML IJ SOLN
0.2500 mg | Freq: Once | INTRAMUSCULAR | Status: DC | PRN
Start: 2015-05-30 — End: 2015-06-01
  Filled 2015-05-30: qty 1

## 2015-05-30 MED ORDER — CITRIC ACID-SODIUM CITRATE 334-500 MG/5ML PO SOLN
30.0000 mL | ORAL | Status: DC | PRN
  Administered 2015-05-30: 30 mL via ORAL
  Filled 2015-05-30: qty 15

## 2015-05-30 MED ORDER — ONDANSETRON HCL 4 MG/2ML IJ SOLN
4.0000 mg | Freq: Four times a day (QID) | INTRAMUSCULAR | Status: DC | PRN
Start: 2015-05-30 — End: 2015-06-01

## 2015-05-30 MED ORDER — PENICILLIN G POTASSIUM 5000000 UNITS IJ SOLR
5.0000 10*6.[IU] | Freq: Once | INTRAVENOUS | Status: AC
  Administered 2015-05-31: 5 10*6.[IU] via INTRAVENOUS
  Filled 2015-05-30: qty 5

## 2015-05-30 MED ORDER — TRIAMCINOLONE 0.1 % CREAM:EUCERIN CREAM 1:1
1.0000 | TOPICAL_CREAM | Freq: Two times a day (BID) | CUTANEOUS | Status: DC
Start: 2015-05-30 — End: 2015-05-30

## 2015-05-30 MED ORDER — ACETAMINOPHEN 325 MG PO TABS
650.0000 mg | ORAL_TABLET | ORAL | Status: DC | PRN
  Administered 2015-06-01: 650 mg via ORAL
  Filled 2015-05-30: qty 2

## 2015-05-30 MED ORDER — MISOPROSTOL 200 MCG PO TABS
50.0000 ug | ORAL_TABLET | ORAL | Status: DC
  Administered 2015-05-30: 50 ug via ORAL
  Filled 2015-05-30: qty 0.5

## 2015-05-30 NOTE — Progress Notes (Addendum)
   SANAA ZILBERMAN is a 30 y.o. G2P0010 at [redacted]w[redacted]d  admitted for induction of labor due to [facility].  Subjective:  Ctx are very uncomfortable Objective: Filed Vitals:   05/30/15 1818 05/30/15 1942 05/30/15 2135 05/30/15 2254  BP: 141/77 136/77 130/44 130/77  Pulse: 92 73 88 89  Temp:  99.3 F (37.4 C)    TempSrc:  Oral    Resp: Height:      Weight:          FHT:  FHR: 130 bpm, variability: moderate,  accelerations:  Present,  decelerations:  Absent UC:   irregular, every 1-3 minutes SVE:   4/50/-3 Foley fell out  Labs: Lab Results  Component Value Date   WBC 12.7* 05/30/2015   HGB 11.3* 05/30/2015   HCT 33.5* 05/30/2015   MCV 88.2 05/30/2015   PLT 218 05/30/2015    Assessment / Plan: IOL for GHTN, ripening phase complete  Because she is contracting so much, and they are getting more painful, will check cervix again in 2 hours.  If cervix not changing despite contractions, will start pitocin.  Labor: Progressing normally Fetal Wellbeing:  Category I Pain Control:  Labor support without medications Anticipated MOD:  NSVD  CRESENZO-DISHMAN,Azad Calame 05/30/2015, 11:03 PM

## 2015-05-30 NOTE — H&P (Signed)
LABOR AND DELIVERY ADMISSION HISTORY AND PHYSICAL NOTE  Olivia Faulkner is a 30 y.o. female G2P0010 with IUP at [redacted]w[redacted]d by 13w u/s presenting for IOL due to hypertension. Of note, patient is a high risk pregnancy due to Suboxone maintenance use. Patient was seen in the [facility] clinic this morning for her regular follow up appointment and was found to have an elevated blood pressure of 145/85. Denies blurry vision, abdominal pain, or edema. She reports positive fetal movement. She denies leakage of fluid or vaginal bleeding.  Prenatal History/Complications: - High Risk Pregnancy; Suboxone maintenance therapy throughout pregnancy  Past Medical History: Past Medical History  Diagnosis Date  . Asthma   . History of chicken pox   . Depression   . Allergic rhinitis   . Migraine   . Eczema   . MRSA (methicillin resistant staph aureus) culture positive   . Pregnancy induced hypertension   . Vaginal Pap smear, abnormal     LGISL    Past Surgical History: Past Surgical History  Procedure Laterality Date  . No past surgeries  04/16/2011    Obstetrical History: OB History    Gravida Para Term Preterm AB TAB SAB Ectopic Multiple Living   Social History: Social History   Social History  . Marital Status: Single    Spouse Name: N/A  . Number of Children: N/A  . Years of Education: N/A   Social History Main Topics  . Smoking status: Former Smoker -- 0.50 packs/day    Types: Cigarettes    Quit date: 10/04/2014  . Smokeless tobacco: Never Used  . Alcohol Use: No  . Drug Use: Yes     Comment: suboxone  . Sexual Activity: Yes    Birth Control/ Protection: None   Other Topics Concern  . None   Social History Narrative    Family History: Family History  Problem Relation Age of Onset  . Alcohol abuse Maternal Grandfather   . Lung cancer Mother     Living  . Hypertension Mother   . COPD Mother   . Cancer Mother   . Heart disease Maternal Grandmother   . Kidney  disease Maternal Grandmother   . Hypertension Maternal Grandmother   . Lung cancer Maternal Grandmother   . Diabetes Father     Living  . Hypertension Father   . Skin cancer Father   . Heart disease Paternal Grandmother   . Heart disease Paternal Grandfather   . Alcohol abuse Maternal Aunt   . COPD Maternal Aunt   . Alcohol abuse Brother   . Asthma Brother     Allergies: Allergies  Allergen Reactions  . Sulfa Antibiotics     Prescriptions prior to admission  Medication Sig Dispense Refill Last Dose  . Buprenorphine HCl-Naloxone HCl (SUBOXONE) 4-1 MG FILM Place 2 mg under the tongue.    05/29/2015 at 0830  . Prenatal Vit-Fe Fumarate-FA (PRENATAL VITAMINS PLUS) 27-1 MG TABS Take 1 tablet by mouth daily. 30 tablet 11 Past Week at Unknown time  . PROVENTIL HFA 108 (90 Base) MCG/ACT inhaler INHALE ONE TO TWO PUFFS BY MOUTH EVERY 6 HOURS AS NEEDED FOR WHEEZING/SHORTNESS OF BREATH 7 each 0 05/29/2015 at 1130  . ranitidine (ZANTAC) 150 MG tablet Take 150 mg by mouth 2 (two) times daily.   05/28/2015 at 2200  . diphenhydrAMINE (BENADRYL) 50 MG capsule Take 50 mg by mouth every 6 (six) hours  as needed. Reported on 04/18/2015   Unknown at Unknown time  . predniSONE (DELTASONE) 10 MG tablet Take 10 mg by mouth daily with breakfast.   More than a month at Unknown time  . Triamcinolone Acetonide (TRIAMCINOLONE 0.1 % CREAM : EUCERIN) CREA Apply 1 application topically 2 (two) times daily. 1 each 6 Taking     Review of Systems   All systems reviewed and negative except as stated in HPI  Blood pressure 142/90, pulse 100, temperature 98.2 F (36.8 C), temperature source Oral, resp. rate 18, height  (1.6 m), weight 64.864 kg (143 lb), last menstrual period 07/18/2014, unknown if currently breastfeeding. General appearance: alert, cooperative and no distress Lungs: clear to auscultation bilaterally Heart: regular rate and rhythm Abdomen: soft, non-tender; bowel sounds normal Extremities: No  calf swelling or tenderness Presentation: not examined Fetal monitoring: FHR- 135-140, acceleration noted, no deceleration, moderate variablity Uterine activity: 3-4 minutes     Prenatal labs: ABO, Rh: B/Positive/-- (07/18 0000) Antibody: Negative (07/18 0000) Rubella: Immune RPR: NON REAC (11/29 1113)  HBsAg: Negative (07/18 0000)  HIV: NONREACTIVE (11/29 1113)  GBS:  Positive 1 hr Glucola: 111 Genetic screening:  Not performed Anatomy US: Nml  Prenatal Transfer Tool  Maternal Diabetes: No Genetic Screening: Declined Maternal Ultrasounds/Referrals: Normal Fetal Ultrasounds or other Referrals:  None Maternal Substance Abuse:  Yes:  Type: Other:  Suboxone maintence Significant Maternal Medications:  Meds include: Other: Suboxone Significant Maternal Lab Results: None  Results for orders placed or performed in visit on 05/30/15 (from the past 24 hour(s))  POCT urinalysis dip (device)   Collection Time: 05/30/15 11:09 AM  Result Value Ref Range   Glucose, UA NEGATIVE NEGATIVE mg/dL   Bilirubin Urine NEGATIVE NEGATIVE   Ketones, ur NEGATIVE NEGATIVE mg/dL   Specific Gravity, Urine 1.020 1.005 - 1.030   Hgb urine dipstick NEGATIVE NEGATIVE   pH 7.0 5.0 - 8.0   Protein, ur NEGATIVE NEGATIVE mg/dL   Urobilinogen, UA 0.2 0.0 - 1.0 mg/dL   Nitrite NEGATIVE NEGATIVE   Leukocytes, UA NEGATIVE NEGATIVE    Patient Active Problem List   Diagnosis Date Noted  . 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 Anemia in pregnancy 03/06/2015  . 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 Supervision of high risk pregnancy, antepartum 11/08/2014  . Drug use affecting pregnancy 11/08/2014  . Eczema 06/06/2014  . Anxiety and depression 04/16/2011    Assessment: Olivia Faulkner is a 30 y.o. G2P0010 at [redacted]w[redacted]d w/ hx of suboxone therapy here for IOL for elevated blood pressure.   #Labor: IOL, will initiate Cytotec  and foley bulb  #Pain: Epidural #FWB: Category I #ID: GBS positive, will need abx #MOF: Breast #MOC: OCPs but still considering other options #Circ:  Outpatient  Patient seen by myself and Demetrios Loll, PA-S   Valentina Shaggy Gambino 05/30/2015, 12:17 PM

## 2015-05-30 NOTE — Consults (Signed)
  Anesthesia Pain Consult Note  Patient: Olivia Faulkner, 30 y.o., female  Consult Requested by: Willodean Rosenthal, *  Reason for Consult: Crna pain rounds  Level of Consciousness: alert  Pain: 1, pain goal 6 desires epidural    [facility] 05/30/2015

## 2015-05-30 NOTE — Progress Notes (Addendum)
Labor Progress Note  Olivia Faulkner is a 30 y.o. G2P0010 at [redacted]w[redacted]d  admitted for induction of labor due to Hypertension.  S: Patient seen with Dr. Erin Fulling. No complaints from patient at this time. Foley bulb was placed and Cytotec was given to induce labor.   O:  BP 147/90 mmHg  Pulse 87  Temp(Src) 98.2 F (36.8 C) (Oral)  Resp 20  Ht  (1.6 m)  Wt 64.864 kg (143 lb)  BMI 25.34 kg/m2  LMP 07/18/2014    FHT:  FHR: 130 bpm, variability: moderate,  accelerations:  Present,  decelerations:  Absent UC:   occasional SVE:   Dilation: 2 Effacement (%): 50 Station: -3 Exam by:: Dr Erin Fulling  Labs: Lab Results  Component Value Date   WBC 12.7* 05/30/2015   HGB 11.3* 05/30/2015   HCT 33.5* 05/30/2015   MCV 88.2 05/30/2015   PLT 218 05/30/2015    Assessment / Plan: 30 y.o. G2P0010 [redacted]w[redacted]d w/ hx of suboxone therapy here for induction of labor due to hypertension,  s/p foley bulb and cytotec  Labor: Will continue to monitor for improvement with cytotec and foley bulb Fetal Wellbeing:  Category I Pain Control:  Epidural in the future Anticipated MOD:  NSVD     Anders Simmonds, MD [facility], PGY-1

## 2015-05-30 NOTE — Progress Notes (Signed)
CNM notified foley bulb out. SVE now and recheck in 2 hours. If no change order to start Pitocin.

## 2015-05-30 NOTE — Progress Notes (Signed)
Subjective:  Olivia Faulkner is a 30 y.o. G2P0010 at [redacted]w[redacted]d being seen today for ongoing prenatal care.  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  Patient reports occasional contractions.  Contractions: Irregular. Vag. Bleeding: None.  Movement: Present. Denies leaking of fluid.   The following portions of the patient's history were reviewed and updated as appropriate: allergies, current medications, past family history, past medical history, past social history, past surgical history and problem list. Problem list updated.  Objective:   Filed Vitals:   05/30/15 1030  BP: 145/85  Pulse: 92  Temp: 97 F (36.1 C)  Weight: 143 lb 11.2 oz (65.182 kg)    Fetal Status: Fetal Heart Rate (bpm): 145 Fundal Height: 38 cm Movement: Present  Presentation: Vertex  General:  Alert, oriented and cooperative. Patient is in no acute distress.  Skin: Skin is warm and dry. No rash noted.   Cardiovascular: Normal heart rate noted  Respiratory: Normal respiratory effort, no problems with respiration noted  Abdomen: Soft, gravid, appropriate for gestational age. Pain/Pressure: Present     Pelvic: Vag. Bleeding: None     Cervical exam performed Dilation: 1 Effacement (%): 50 Station: -2  Extremities: Normal range of motion.  Edema: None  Mental Status: Normal mood and affect. Normal behavior. Normal judgment and thought content.   Urinalysis: Urine Protein: Negative Urine Glucose: Negative  Assessment and Plan:  Pregnancy: G2P0010 at 109w2d  1. Supervision of high risk pregnancy, antepartum, third trimester - IOL today  2. Pregnancy complicated by Suboxone maintenance,  antepartum, third trimester (HCC) - IOL today  3.  Elevated Blood Pressure - Consulted with Dr. Erin Fulling regarding blood pressure > IOL today - Notified resident on call  Marlis Edelson, CNM

## 2015-05-30 NOTE — Consults (Signed)
  Anesthesia Pain Consult Note  Patient: Olivia Faulkner, 30 y.o., female  Consult Requested by: Willodean Rosenthal, *  Reason for Consult: Epidural Rounding by CRNA   Level of Consciousness: alert  Pain: none (Pain Goal: 5-6)   Last Vitals:  Filed Vitals:   05/30/15 1520 05/30/15 1818  BP: 140/71 141/77  Pulse: 73 92  Temp: 37.2 C   Resp: 20 18    Plan: Epidural infusion for pain control.  Jazmine Heckman L 05/30/2015

## 2015-05-31 ENCOUNTER — Inpatient Hospital Stay (HOSPITAL_COMMUNITY): Payer: Medicaid Other | Admitting: Anesthesiology

## 2015-05-31 LAB — RPR: RPR: NONREACTIVE

## 2015-05-31 LAB — CBC
HEMATOCRIT: 29 % — AB (ref 36.0–46.0)
HEMOGLOBIN: 9.8 g/dL — AB (ref 12.0–15.0)
MCH: 29.2 pg (ref 26.0–34.0)
MCHC: 33.8 g/dL (ref 30.0–36.0)
MCV: 86.3 fL (ref 78.0–100.0)
Platelets: 171 10*3/uL (ref 150–400)
RBC: 3.36 MIL/uL — ABNORMAL LOW (ref 3.87–5.11)
RDW: 12.7 % (ref 11.5–15.5)
WBC: 16.5 10*3/uL — AB (ref 4.0–10.5)

## 2015-05-31 MED ORDER — EPHEDRINE 5 MG/ML INJ
10.0000 mg | INTRAVENOUS | Status: DC | PRN
  Filled 2015-05-31: qty 2

## 2015-05-31 MED ORDER — SODIUM BICARBONATE 8.4 % IV SOLN
INTRAVENOUS | Status: DC | PRN
  Administered 2015-05-31 (×2): 4 mL via EPIDURAL

## 2015-05-31 MED ORDER — FENTANYL 2.5 MCG/ML BUPIVACAINE 1/10 % EPIDURAL INFUSION (WH - ANES)
14.0000 mL/h | INTRAMUSCULAR | Status: DC | PRN
  Administered 2015-05-31 (×2): 14 mL/h via EPIDURAL
  Filled 2015-05-31 (×2): qty 125

## 2015-05-31 MED ORDER — ZOLPIDEM TARTRATE 5 MG PO TABS
5.0000 mg | ORAL_TABLET | Freq: Every evening | ORAL | Status: DC | PRN
Start: 2015-05-31 — End: 2015-06-01
  Administered 2015-05-31: 5 mg via ORAL
  Filled 2015-05-31: qty 1

## 2015-05-31 MED ORDER — PHENYLEPHRINE 40 MCG/ML (10ML) SYRINGE FOR IV PUSH (FOR BLOOD PRESSURE SUPPORT)
80.0000 ug | PREFILLED_SYRINGE | INTRAVENOUS | Status: DC | PRN
  Filled 2015-05-31: qty 2

## 2015-05-31 MED ORDER — DIPHENHYDRAMINE HCL 50 MG/ML IJ SOLN: 12.5000 mg | INTRAMUSCULAR | Status: DC | PRN

## 2015-05-31 MED ORDER — LACTATED RINGERS IV SOLN
500.0000 mL | Freq: Once | INTRAVENOUS | Status: AC
  Administered 2015-05-31: 1000 mL via INTRAVENOUS

## 2015-05-31 MED ORDER — LIDOCAINE HCL (PF) 1 % IJ SOLN
INTRAMUSCULAR | Status: DC | PRN
  Administered 2015-05-31 (×2): 4 mL

## 2015-05-31 MED ORDER — PHENYLEPHRINE 40 MCG/ML (10ML) SYRINGE FOR IV PUSH (FOR BLOOD PRESSURE SUPPORT)
80.0000 ug | PREFILLED_SYRINGE | INTRAVENOUS | Status: DC | PRN
  Filled 2015-05-31: qty 2
  Filled 2015-05-31: qty 20

## 2015-05-31 NOTE — Consults (Signed)
  Anesthesia Pain Consult Note  Patient: Olivia Faulkner, 30 y.o., female  Consult Requested by: Levie Heritage, DO  Reason for Consult:   CRNA OB Rounding  Pain level - sleeping Per spouse second place epidural working well

## 2015-05-31 NOTE — Anesthesia Preprocedure Evaluation (Signed)
Anesthesia Evaluation  Patient identified by MRN, date of birth, ID band Patient awake    Reviewed: Allergy & Precautions, NPO status , Patient's Chart, lab work & pertinent test results  History of Anesthesia Complications Negative for: history of anesthetic complications  Airway Mallampati: II  TM Distance: >3 FB Neck ROM: Full    Dental no notable dental hx. (+) Dental Advisory Given   Pulmonary asthma , former smoker,    Pulmonary exam normal breath sounds clear to auscultation       Cardiovascular hypertension, Normal cardiovascular exam Rhythm:Regular Rate:Normal     Neuro/Psych  Headaches, PSYCHIATRIC DISORDERS Anxiety Depression    GI/Hepatic negative GI ROS, Neg liver ROS,   Endo/Other  negative endocrine ROS  Renal/GU negative Renal ROS  negative genitourinary   Musculoskeletal negative musculoskeletal ROS (+)   Abdominal   Peds negative pediatric ROS (+)  Hematology negative hematology ROS (+)   Anesthesia Other Findings On suboxone for hx of opioid abuse, given daily dose  Reproductive/Obstetrics negative OB ROS                             Anesthesia Physical Anesthesia Plan  ASA: II  Anesthesia Plan: Epidural   Post-op Pain Management:    Induction:   Airway Management Planned:   Additional Equipment:   Intra-op Plan:   Post-operative Plan:   Informed Consent: I have reviewed the patients History and Physical, chart, labs and discussed the procedure including the risks, benefits and alternatives for the proposed anesthesia with the patient or authorized representative who has indicated his/her understanding and acceptance.   Dental advisory given  Plan Discussed with: CRNA  Anesthesia Plan Comments:         Anesthesia Quick Evaluation

## 2015-05-31 NOTE — Progress Notes (Signed)
Dr Malen Gauze requested to come for patient redose. Pt complaining of rectal pain and pressure. Tearful and very agitated.

## 2015-05-31 NOTE — Progress Notes (Signed)
Olivia Faulkner is a 30 y.o. G2P0010 at [redacted]w[redacted]d admitted for induction of labor due to Gestational Hypertension.  Subjective: Comfortable w/ epidural; right side more numb than left  Objective: BP 122/77 mmHg  Pulse 105  Temp(Src) 98.9 F (37.2 C) (Oral)  Resp 16  Ht  (1.6 m)  Wt 64.864 kg (143 lb)  BMI 25.34 kg/m2  SpO2 100%  LMP 07/18/2014      FHT:  FHR: 130s bpm, variability: moderate,  accelerations:  Present,  decelerations:  Absent UC:   regular, every 2 minutes w/ Pit @ 5mu/min SVE:   Dilation: 9 Effacement (%): 100 Station: 0 Exam by:: Olivia Faulkner CNM  Labs: Lab Results  Component Value Date   WBC 16.5* 05/31/2015   HGB 9.8* 05/31/2015   HCT 29.0* 05/31/2015   MCV 86.3 05/31/2015   PLT 171 05/31/2015    Assessment / Plan: IUP@40 .3wks gHTN- BP stable Suboxone use Active labor  Will recheck cx in 2 hours or sooner w/ pressure  Tilak Oakley CNM 05/31/2015, 5:07 PM

## 2015-05-31 NOTE — Progress Notes (Signed)
   Olivia Faulkner is a 30 y.o. G2P0010 at [redacted]w[redacted]d  admitted for induction of labor due to [facility].  Subjective:  contractions have slowed down a lot  Objective: Filed Vitals:   05/30/15 2254 05/31/15 0023 05/31/15 0126 05/31/15 0407  BP: 130/77 124/65 135/71 113/57  Pulse: 89 82 90 74  Temp:  99.4 F (37.4 C)  98.5 F (36.9 C)  TempSrc:  Oral    Resp: Height:      Weight:          FHT:  FHR: 160 bpm, variability: moderate,  accelerations:  Present,  decelerations:  Absent UC:   irregular, every 5-7 minutes SVE:  6/50/-2 Labs: Lab Results  Component Value Date   WBC 16.5* 05/31/2015   HGB 9.8* 05/31/2015   HCT 29.0* 05/31/2015   MCV 86.3 05/31/2015   PLT 171 05/31/2015    Assessment / Plan: IOL for GHTN, labor stalled out  Labor: stalled--will start pitocin Fetal Wellbeing:  Category I Pain Control:  Labor support without medications Anticipated MOD:  NSVD  CRESENZO-DISHMAN,Contrina Orona 05/31/2015, 7:46 AM

## 2015-05-31 NOTE — Progress Notes (Signed)
   Olivia Faulkner is a 30 y.o. G2P0010 at [redacted]w[redacted]d  admitted for induction of labor due to [facility].  Objective: Filed Vitals:   05/30/15 2135 05/30/15 2254 05/31/15 0023 05/31/15 0126  BP: 130/44 130/77 124/65 135/71  Pulse: 88 89 82 90  Temp:   99.4 F (37.4 C)   TempSrc:   Oral   Resp: Height:      Weight:          FHT:  FHR: 130 bpm, variability: moderate,  accelerations:  Present,  decelerations:  Absent UC:   irregular, every 1-3 minutes SVE:   Dilation: 6 Effacement (%): 50 Station: -3, -2 Exam by:: Joni Fears Tietje RN   Labs: Lab Results  Component Value Date   WBC 16.5* 05/31/2015   HGB 9.8* 05/31/2015   HCT 29.0* 05/31/2015   MCV 86.3 05/31/2015   PLT 171 05/31/2015    Assessment / Plan: IOL for GHTN, progressing welll without pitocin  Labor: Progressing normally Fetal Wellbeing:  Category I Pain Control:  laobr support iwthout medications--may have epidural when she wants it Anticipated MOD:  NSVD  CRESENZO-DISHMAN,Vermon Grays 05/31/2015, 2:47 AM

## 2015-05-31 NOTE — Consults (Signed)
  Anesthesia Pain Consult Note  Patient: Olivia Faulkner, 30 y.o., female  Consult Requested by: Levie Heritage, DO  Goal: 3/10  Current Assessment: 1/10    Jaecion Dempster 05/31/2015

## 2015-05-31 NOTE — Anesthesia Procedure Notes (Signed)
Epidural Patient location during procedure: OB  Staffing Anesthesiologist: Gaylen Pereira Performed by: anesthesiologist   Preanesthetic Checklist Completed: patient identified, site marked, surgical consent, pre-op evaluation, timeout performed, IV checked, risks and benefits discussed and monitors and equipment checked  Epidural Patient position: sitting Prep: site prepped and draped and DuraPrep Patient monitoring: continuous pulse ox and blood pressure Approach: midline Location: L3-L4 Injection technique: LOR saline  Needle:  Needle type: Tuohy  Needle gauge: 17 G Needle length: 9 cm and 9 Needle insertion depth: 5 cm cm Catheter type: closed end flexible Catheter size: 19 Gauge Catheter at skin depth: 10 cm Test dose: negative  Assessment Events: blood not aspirated, injection not painful, no injection resistance, negative IV test and no paresthesia  Additional Notes Patient identified. Risks/Benefits/Options discussed with patient including but not limited to bleeding, infection, nerve damage, paralysis, failed block, incomplete pain control, headache, blood pressure changes, nausea, vomiting, reactions to medication both or allergic, itching and postpartum back pain. Confirmed with bedside nurse the patient's most recent platelet count. Confirmed with patient that they are not currently taking any anticoagulation, have any bleeding history or any family history of bleeding disorders. Patient expressed understanding and wished to proceed. All questions were answered. Sterile technique was used throughout the entire procedure. Please see nursing notes for vital signs. Test dose was given through epidural catheter and negative prior to continuing to dose epidural or start infusion. Warning signs of high block given to the patient including shortness of breath, tingling/numbness in hands, complete motor block, or any concerning symptoms with instructions to call for help. Patient was  given instructions on fall risk and not to get out of bed. All questions and concerns addressed with instructions to call with any issues or inadequate analgesia.      

## 2015-06-01 ENCOUNTER — Encounter (HOSPITAL_COMMUNITY): Payer: Self-pay | Admitting: *Deleted

## 2015-06-01 DIAGNOSIS — Z3A40 40 weeks gestation of pregnancy: Secondary | ICD-10-CM

## 2015-06-01 DIAGNOSIS — O134 Gestational [pregnancy-induced] hypertension without significant proteinuria, complicating childbirth: Secondary | ICD-10-CM

## 2015-06-01 DIAGNOSIS — O99324 Drug use complicating childbirth: Secondary | ICD-10-CM

## 2015-06-01 DIAGNOSIS — F1190 Opioid use, unspecified, uncomplicated: Secondary | ICD-10-CM

## 2015-06-01 DIAGNOSIS — O99824 Streptococcus B carrier state complicating childbirth: Secondary | ICD-10-CM

## 2015-06-01 LAB — CBC
HCT: 27.6 % — ABNORMAL LOW (ref 36.0–46.0)
HEMOGLOBIN: 9.3 g/dL — AB (ref 12.0–15.0)
MCH: 29.5 pg (ref 26.0–34.0)
MCHC: 33.7 g/dL (ref 30.0–36.0)
MCV: 87.6 fL (ref 78.0–100.0)
PLATELETS: 165 10*3/uL (ref 150–400)
RBC: 3.15 MIL/uL — AB (ref 3.87–5.11)
RDW: 12.4 % (ref 11.5–15.5)
WBC: 20.4 10*3/uL — AB (ref 4.0–10.5)

## 2015-06-01 MED ORDER — BENZOCAINE-MENTHOL 20-0.5 % EX AERO
1.0000 "application " | INHALATION_SPRAY | CUTANEOUS | Status: DC | PRN
  Administered 2015-06-01: 1 via TOPICAL
  Filled 2015-06-01: qty 56

## 2015-06-01 MED ORDER — ONDANSETRON HCL 4 MG PO TABS: 4.0000 mg | ORAL_TABLET | ORAL | Status: DC | PRN

## 2015-06-01 MED ORDER — SENNOSIDES-DOCUSATE SODIUM 8.6-50 MG PO TABS
2.0000 | ORAL_TABLET | ORAL | Status: DC
  Administered 2015-06-01: 2 via ORAL
  Filled 2015-06-01: qty 2

## 2015-06-01 MED ORDER — PRENATAL MULTIVITAMIN CH
1.0000 | ORAL_TABLET | Freq: Every day | ORAL | Status: DC
  Administered 2015-06-01 – 2015-06-02 (×2): 1 via ORAL
  Filled 2015-06-01 (×2): qty 1

## 2015-06-01 MED ORDER — ACETAMINOPHEN 325 MG PO TABS: 650.0000 mg | ORAL_TABLET | ORAL | Status: DC | PRN

## 2015-06-01 MED ORDER — ONDANSETRON HCL 4 MG/2ML IJ SOLN: 4.0000 mg | INTRAMUSCULAR | Status: DC | PRN

## 2015-06-01 MED ORDER — DIPHENHYDRAMINE HCL 25 MG PO CAPS: 25.0000 mg | ORAL_CAPSULE | Freq: Four times a day (QID) | ORAL | Status: DC | PRN

## 2015-06-01 MED ORDER — DIBUCAINE 1 % RE OINT: 1.0000 "application " | TOPICAL_OINTMENT | RECTAL | Status: DC | PRN

## 2015-06-01 MED ORDER — ZOLPIDEM TARTRATE 5 MG PO TABS: 5.0000 mg | ORAL_TABLET | Freq: Every evening | ORAL | Status: DC | PRN

## 2015-06-01 MED ORDER — LANOLIN HYDROUS EX OINT: TOPICAL_OINTMENT | CUTANEOUS | Status: DC | PRN

## 2015-06-01 MED ORDER — IBUPROFEN 600 MG PO TABS
600.0000 mg | ORAL_TABLET | Freq: Four times a day (QID) | ORAL | Status: DC
  Administered 2015-06-01 – 2015-06-02 (×6): 600 mg via ORAL
  Filled 2015-06-01 (×6): qty 1

## 2015-06-01 MED ORDER — WITCH HAZEL-GLYCERIN EX PADS: 1.0000 "application " | MEDICATED_PAD | CUTANEOUS | Status: DC | PRN

## 2015-06-01 MED ORDER — SIMETHICONE 80 MG PO CHEW: 80.0000 mg | CHEWABLE_TABLET | ORAL | Status: DC | PRN

## 2015-06-01 MED ORDER — TETANUS-DIPHTH-ACELL PERTUSSIS 5-2.5-18.5 LF-MCG/0.5 IM SUSP: 0.5000 mL | Freq: Once | INTRAMUSCULAR | Status: DC

## 2015-06-01 NOTE — Lactation Note (Signed)
This note was copied from a baby's chart. Lactation Consultation Note  Initial visit made.  Breastfeeding consultation services and support information reviewed and given to patient.  Mom states baby is latching easily.  She just finished a feeding and baby is sleeping on mom's chest.  Instructed to watch for feeding cues and offer the breast each time.  Encouraged to call for concerns/assist prn.  Patient Name: Boy Nonna Renninger [id number] Date: 06/01/2015 Reason for consult: Initial assessment   Maternal Data Formula Feeding for Exclusion: Yes Reason for exclusion: Substance abuse and/or alcohol abuse Does the patient have breastfeeding experience prior to this delivery?: No  Feeding Feeding Type: Breast Fed Length of feed: 10 min  LATCH Score/Interventions                      Lactation Tools Discussed/Used     Consult Status Consult Status: Follow-up Date: 06/02/15 Follow-up type: In-patient    Huston Foley 06/01/2015, 3:30 PM

## 2015-06-01 NOTE — Clinical Social Work Maternal (Signed)
CLINICAL SOCIAL WORK MATERNAL/CHILD NOTE  Patient Details  Name: Olivia Faulkner MRN: [id number] Date of Birth: 06/07/1985  Date: 06/01/2015  Clinical Social Worker Initiating Note: Kimyah Frein, LCSWDate/ Time Initiated: 06/01/15/0954   Child's Name: Maxx Ryland Lambert   Legal Guardian:  (Parents Rashiya Liguori and Michael Lambert)   Need for Interpreter: None   Date of Referral: 05/31/15   Reason for Referral: Other (Comment)   Referral Source: [facility]   Address: [address] [place], [place] [address]  Phone number:  ([phone])   Household Members: Self, Pets, Significant Other   Natural Supports (not living in the home): Extended Family, Immediate Family   Professional Supports:Other (Comment) (Dr. 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 She also reports hx of depression. Met with mother who was pleasant and receptive to social work. She and FOB cohabitate and this is their first child. Informed that they are currently dealing with being evicted from their home. FOB was recently laid off and they fell behind on the rent. He has since found another job and expected to start on Monday. Discussed a possible back up plan in the event that they are not able to  remain in their home. MOB states that she is certain that as a last resort they can stay with her family. Encouraged her to speak to the people involved to confirm the back up plan in he event it is needed. MOB reports hx of substance abuse. Informed that she was introduced by a boyfriend to pain medication during the time her mother was very ill and quickly developed an addiction. She reportedly sought treatment 3 years ago and was started on methadone. However, there was some side effects and she was switched to suboxone about 18 months ago. Informed that she suboxone was very effective and she was in the process of weaning off the medication, but found out she was pregnant and counseled to wait until after the pregnancy because of the risk of harm to the baby. She notes that she is currently on a very low dose (2mg) and plan wean completely off once she's discharge. She denies any current cravings. Informed that she is in treatment with [facility] and sees Dr. Johnson. She admits to occasional use of marijuana and notes that she stopped once she became aware of the pregnancy. She denies any use of alcohol or illicit drug use during pregnancy. UDS on newborn pending. Discussed some relapse prevention strategies with mother. She was receptive to the information.  MOB reports hx of depression, and states that she was treated with celexa about 3 years ago but stop the medication after 3 months because she didn't notice a difference. She also participated in therapy. She denies any current symptoms and states that since she was treated for   the depression, she hasn't had any recurring symptons. No acute social concerns noted or reported at this time. Affect and behavior was appropriate during the entire visit. Mother informed of social work availability.  CSW Plan/Description:    Discussed signs/symptoms of PP Depression and available resources Will continue to monitor drug screen No  barriers to discharge    Yoshi Vicencio J, LCSW 06/01/2015, 1:17 PM  

## 2015-06-01 NOTE — Anesthesia Postprocedure Evaluation (Signed)
Anesthesia Post Note  Patient: Olivia Faulkner  Procedure(s) Performed: * No procedures listed *  Patient location during evaluation: Mother Baby Anesthesia Type: Epidural Level of consciousness: awake and alert and oriented Pain management: satisfactory to patient Vital Signs Assessment: post-procedure vital signs reviewed and stable Respiratory status: spontaneous breathing and nonlabored ventilation Cardiovascular status: stable Postop Assessment: no headache, no backache, no signs of nausea or vomiting, adequate PO intake and patient able to bend at knees (patient up walking) Anesthetic complications: no    Last Vitals:  Filed Vitals:   06/01/15 0424 06/01/15 0803  BP: 115/57 124/56  Pulse: 61 59  Temp: 37.2 C 36.7 C  Resp: 18 18    Last Pain:  Filed Vitals:   06/01/15 0804  PainSc: 2                  Camilo Mander

## 2015-06-02 ENCOUNTER — Ambulatory Visit: Payer: Self-pay

## 2015-06-02 MED ORDER — IBUPROFEN 600 MG PO TABS: 600.0000 mg | ORAL_TABLET | Freq: Four times a day (QID) | ORAL | Status: DC

## 2015-06-02 MED ORDER — NORETHINDRONE 0.35 MG PO TABS: 1.0000 | ORAL_TABLET | Freq: Every day | ORAL | Status: DC

## 2015-06-02 NOTE — Discharge Summary (Signed)
OB Discharge Summary  Patient Name: Olivia Faulkner DOB: April 01, 1986 MRN: [id number]  Date of admission: 05/30/2015 Delivering MD: Levie Heritage   Date of discharge: 06/02/2015  Admitting diagnosis: INDUCTION Intrauterine pregnancy: [redacted]w[redacted]d     Secondary diagnosis:Active Problems:   Pregnancy  Additional problems:none     Discharge diagnosis: Term Pregnancy Delivered                                                                     Post partum procedures:none  Augmentation: Pitocin  Complications: None  Hospital course:  Onset of Labor With Vaginal Delivery     30 y.o. yo G2P1011 at [redacted]w[redacted]d was admitted in Latent Labor on 05/30/2015. Patient had an uncomplicated labor course as follows:  Membrane Rupture Time/Date: 2:10 PM ,05/31/2015   Intrapartum Procedures: Episiotomy: None [1]                                         Lacerations:  1st degree [2];Labial [10]  Patient had a delivery of a Viable infant. 06/01/2015  Information for the patient's newborn:  Kursten, Kruk [[id number]]  Delivery Method: Vaginal, Vacuum (Extractor) (Filed from Delivery Summary)    Pateint had an uncomplicated postpartum course.  She is ambulating, tolerating a regular diet, passing flatus, and urinating well. Patient is discharged home in stable condition on 06/02/2015.    Physical exam  Filed Vitals:   06/01/15 1546 06/01/15 1548 06/01/15 1800 06/02/15 0607  BP: 129/71  118/66 124/71  Pulse: 87  78 68  Temp: 98.5 F (36.9 C)  98 F (36.7 C) 97.8 F (36.6 C)  TempSrc: Oral  Oral Oral  Resp: Height:      Weight:      SpO2:  99% 100%    General: alert, cooperative and no distress Lochia: appropriate Uterine Fundus: firm Incision: N/A DVT Evaluation: No evidence of DVT seen on physical exam. No cords or calf tenderness. No significant calf/ankle edema. Labs: Lab Results  Component Value Date   WBC 20.4* 06/01/2015   HGB 9.3* 06/01/2015   HCT 27.6* 06/01/2015    MCV 87.6 06/01/2015   PLT 165 06/01/2015   CMP Latest Ref Rng 05/30/2015  Glucose 65 - 99 mg/dL 87  BUN 6 - 20 mg/dL 6  Creatinine 7.82 - 9.56 mg/dL 2.13  Sodium 086 - 578 mmol/L 138  Potassium 3.5 - 5.1 mmol/L 3.9  Chloride 101 - 111 mmol/L 107  CO2 22 - 32 mmol/L 22  Calcium 8.9 - 10.3 mg/dL 8.9  Total Protein 6.5 - 8.1 g/dL 6.6  Total Bilirubin 0.3 - 1.2 mg/dL 0.6  Alkaline Phos 38 - 126 U/L 230(H)  AST 15 - 41 U/L 23  ALT 14 - 54 U/L 10(L)    Discharge instruction: per After Visit Summary and "Baby and Me Booklet".  After Visit Meds:    Medication List    ASK your doctor about these medications        diphenhydramine-acetaminophen 25-500 MG Tabs tablet  Commonly known as:  TYLENOL PM  Take 1 tablet by mouth at bedtime  as needed (sleep).     PRENATAL VITAMINS PLUS 27-1 MG Tabs  Take 1 tablet by mouth daily.     PROVENTIL HFA 108 (90 Base) MCG/ACT inhaler  Generic drug:  albuterol  INHALE ONE TO TWO PUFFS BY MOUTH EVERY 6 HOURS AS NEEDED FOR WHEEZING/SHORTNESS OF BREATH     ranitidine 150 MG tablet  Commonly known as:  ZANTAC  Take 150 mg by mouth 2 (two) times daily.     SUBOXONE 4-1 MG Film  Generic drug:  Buprenorphine HCl-Naloxone HCl  Place 2 mg under the tongue.     triamcinolone 0.1 % cream : eucerin Crea  Apply 1 application topically 2 (two) times daily.        Diet: routine diet  Activity: Advance as tolerated. Pelvic rest for 6 weeks.   Outpatient follow up:6 weeks Follow up Appt:No future appointments. Follow up visit: No Follow-up on file.  Postpartum contraception: Progesterone only pills  Newborn Data: Live born female  Birth Weight: 8 lb 7.5 oz (3840 g) APGAR: 8, 9  Baby Feeding: Breast Disposition:home with mother   06/02/2015 Ferdie Ping, CNM

## 2015-06-02 NOTE — Lactation Note (Signed)
This note was copied from a baby's chart. Lactation Consultation Note  Patient Name: Olivia Faulkner Date: 06/02/2015 Reason for consult: Follow-up assessment  Olivia Faulkner latches w/ease. When he is suckling, swallows are noted; however, during my consult he mostly slept  at the breast. Olivia Faulkner has some initial tenderness w/latch especially on R breast, but R nipple is relatively atraumatic. Olivia Faulkner shown signs/sound of swallowing.   Parents have my # to call, if they have more questions.   Note: Olivia Faulkner reports minimal breast changes w/pregnancy.  Lurline Hare [facility] 06/02/2015, 9:52 PM

## 2015-06-03 ENCOUNTER — Ambulatory Visit: Payer: Self-pay

## 2015-06-03 NOTE — Lactation Note (Signed)
This note was copied from a baby's chart. Lactation Consultation Note  Patient Name: Olivia Faulkner [id number] Date: 06/03/2015 Reason for consult: Follow-up assessment;Other (Comment) (mom for D/C today see LC note ) Per mom has been breast feeding and bottle feeding EBM or formula.  LC reviewed LC Plan of care , mom denies sore nipples, sore nipple and engorgement prevention and tx reviewed.  LC recommended to mom until milk comes in and baby is gaining well to post pump after 4 feedings a day and PRN.  Also to feed milk back to baby to decrease the potential for withdrawals.  LC called WIC supervisor Candace to arrange for [facility] loaner . Per mom and dad unable to come up with the $30 for loaner fee  From [facility] Per Candace will have the peer counselor to call mom to arrange for pick up time. Mother informed of post-discharge support and given phone number to the lactation department, including services for phone call assistance; out-patient appointments; and breastfeeding support group. List of other breastfeeding resources in the community given in the handout. Encouraged mother to call for problems or concerns related to breastfeeding.   Maternal Data Formula Feeding for Exclusion: No Has patient been taught Hand Expression?: Yes Does the patient have breastfeeding experience prior to this delivery?: No  Feeding    LATCH Score/Interventions                Intervention(s): Breastfeeding basics reviewed     Lactation Tools Discussed/Used Tools: Pump (mom has pumping ) Breast pump type: Double-Electric Breast Pump WIC Program: Yes (per mom [place] [facility] - see LC note )   Consult Status Consult Status: Complete Date: 06/03/15    Kathrin Greathouse 06/03/2015, 11:44 AM

## 2015-06-18 ENCOUNTER — Other Ambulatory Visit: Payer: Self-pay | Admitting: Family Medicine

## 2015-07-17 ENCOUNTER — Ambulatory Visit: Payer: Medicaid Other | Admitting: Obstetrics & Gynecology

## 2016-04-06 NOTE — L&D Delivery Note (Signed)
Patient is a 31 y.o. now G3P2 s/p NSVD at 5550w0d, who was admitted for IOL for single umbilical artery.  She progressed with augmentation (Pitocin and AROM) to complete and pushed 23 minutes to deliver.  Cord clamping delayed by several minutes then clamped by CNM and cut by FOB.  Placenta intact and spontaneous, bleeding minimal. Mom and baby stable prior to transfer to postpartum. She plans on breastfeeding. She requests a tubal for sterilization.   Delivery Note At 5:57 PM a viable female was delivered via Vaginal, Spontaneous (Presentation: LOA ).  APGAR: 9, 9; weight pending.   Placenta delivered via Vicente SereneShultz, 2V Cord:  Anesthesia:  Epidural Episiotomy: None Lacerations: None Suture Repair: none Est. Blood Loss (mL): 100  Mom to postpartum.  Baby to Couplet care / Skin to Skin.  Olivia CableVeronica C Joshva Faulkner CNM 03/12/2017, 6:32 PM

## 2016-07-24 ENCOUNTER — Ambulatory Visit (INDEPENDENT_AMBULATORY_CARE_PROVIDER_SITE_OTHER): Payer: Medicaid Other

## 2016-07-24 ENCOUNTER — Encounter: Payer: Self-pay | Admitting: *Deleted

## 2016-07-24 DIAGNOSIS — Z3201 Encounter for pregnancy test, result positive: Secondary | ICD-10-CM

## 2016-07-24 DIAGNOSIS — Z3200 Encounter for pregnancy test, result unknown: Secondary | ICD-10-CM

## 2016-07-24 LAB — POCT URINE PREGNANCY: Preg Test, Ur: POSITIVE — AB

## 2016-07-24 NOTE — Progress Notes (Signed)
Patient is in the office for UPT, results positive. LMP 06-01-16, EDD 03-08-17, GA is 7w 4d.

## 2016-08-14 ENCOUNTER — Ambulatory Visit (INDEPENDENT_AMBULATORY_CARE_PROVIDER_SITE_OTHER): Payer: Medicaid Other | Admitting: Family Medicine

## 2016-08-14 ENCOUNTER — Encounter: Payer: Self-pay | Admitting: Family Medicine

## 2016-08-14 ENCOUNTER — Other Ambulatory Visit (HOSPITAL_COMMUNITY)
Admission: RE | Admit: 2016-08-14 | Discharge: 2016-08-14 | Disposition: A | Discharge status: Home / Self Care | Payer: Medicaid Other | Source: Ambulatory Visit | Attending: Family Medicine | Admitting: Family Medicine

## 2016-08-14 VITALS — BP 119/50 | HR 70 | Wt 106.0 lb

## 2016-08-14 DIAGNOSIS — F1120 Opioid dependence, uncomplicated: Secondary | ICD-10-CM | POA: Diagnosis not present

## 2016-08-14 DIAGNOSIS — R87612 Low grade squamous intraepithelial lesion on cytologic smear of cervix (LGSIL): Secondary | ICD-10-CM | POA: Diagnosis not present

## 2016-08-14 DIAGNOSIS — O0990 Supervision of high risk pregnancy, unspecified, unspecified trimester: Secondary | ICD-10-CM | POA: Diagnosis not present

## 2016-08-14 DIAGNOSIS — O99320 Drug use complicating pregnancy, unspecified trimester: Secondary | ICD-10-CM

## 2016-08-14 DIAGNOSIS — O99321 Drug use complicating pregnancy, first trimester: Secondary | ICD-10-CM

## 2016-08-14 DIAGNOSIS — Z3689 Encounter for other specified antenatal screening: Secondary | ICD-10-CM

## 2016-08-14 DIAGNOSIS — O0991 Supervision of high risk pregnancy, unspecified, first trimester: Secondary | ICD-10-CM | POA: Diagnosis not present

## 2016-08-14 NOTE — Progress Notes (Signed)
Patient complaining of morning sickness. Olivia StammerJennifer Howard RN BSN  CLINICAL DATA:  Pregnant patient in 1st trimester pregnancy with complaints of nausea  EXAM:AB OB ULTRASOUND   TECHNIQUE:  abdominal ultrasound was performed for complete evaluation of the gestation as well as the maternal uterus, adnexal regions, and pelvic cul-de-sac.   FINDINGS: Intrauterine gestational sac: Single inter uterine pregnacny Embryo:  present  Cardiac Activity:present Heart Rate: 178 bpm.  CRL:2.29 cm  US EDC: 03-19-17 Subchorionic hemorrhage:  not noted Maternal uterus/adnexae: Both ovaries are visualized and are normal  Olivia StammerJennifer Howard RN BSN

## 2016-08-14 NOTE — Progress Notes (Signed)
Subjective:  Olivia RichJulie A Plotkin is a G3P1011 6048w0d being seen today for her first obstetrical visit.  Her obstetrical history is significant for suboxone use in pregnancy. Patient does intend to breast feed. She breastfed for about 6 weeks postpartum. Pregnancy history fully reviewed.  Has hx/o abnormal PAP - LSIL about 1 (04/2015) year ago. Never had colposcopy as was late in pregnancy. PAP 2016 was ASCUS  Previous pregnancy delivered VAVD about 15 months ago.   Patient reports nausea and vomiting.  BP (!) 119/50   Pulse 70   Wt 106 lb (48.1 kg)   LMP 06/01/2016 (Exact Date)   BMI 18.78 kg/m   HISTORY: OB History  Gravida Para Term Preterm AB Living  3 1 1   1 1   SAB TAB Ectopic Multiple Live Births  1     0 1    # Outcome Date GA Lbr Len/2nd Weight Sex Delivery Anes PTL Lv  3 Current           2 Term 06/01/15 3618w4d 04:40 / 05:56 8 lb 7.5 oz (3.84 kg) M Vag-Vacuum EPI  LIV  1 SAB 2009 5760w0d             Past Medical History:  Diagnosis Date  . Allergic rhinitis   . Anxiety   . Asthma    allergic  . Depression   . Eczema   . Former smoker   . History of chicken pox   . Marijuana use   . Migraine   . MRSA (methicillin resistant staph aureus) culture positive   . Pregnancy induced hypertension   . Suboxone maintenance treatment complicating pregnancy, antepartum (HCC)   . Vaginal Pap smear, abnormal    LGSIL    Past Surgical History:  Procedure Laterality Date  . NO PAST SURGERIES  04/16/2011    Family History  Problem Relation Age of Onset  . Alcohol abuse Maternal Grandfather   . Lung cancer Mother        Living  . Hypertension Mother   . COPD Mother   . Cancer Mother   . Heart disease Maternal Grandmother   . Kidney disease Maternal Grandmother   . Hypertension Maternal Grandmother   . Lung cancer Maternal Grandmother   . Diabetes Father        Living  . Hypertension Father   . Skin cancer Father   . Heart disease Paternal Grandmother   . Heart  disease Paternal Grandfather   . Alcohol abuse Maternal Aunt   . COPD Maternal Aunt   . Alcohol abuse Brother   . Asthma Brother      Exam    Uterus:     Pelvic Exam:    Perineum: No Hemorrhoids, Normal Perineum   Vulva: normal, Bartholin's, Urethra, Skene's normal   Vagina:  normal mucosa   Cervix: multiparous appearance, nabothian cyst, no bleeding following Pap, no cervical motion tenderness and no lesions   Adnexa: normal adnexa and no mass, fullness, tenderness   Bony Pelvis: gynecoid  System: Breast:  normal appearance, no masses or tenderness   Skin: normal coloration and turgor, no rashes    Neurologic: gait normal; reflexes normal and symmetric, no focal deficits   Extremities: normal strength, tone, and muscle mass   HEENT PERRLA and extra ocular movement intact   Mouth/Teeth mucous membranes moist, pharynx normal without lesions   Neck supple and no masses   Cardiovascular: regular rate and rhythm, no murmurs or gallops   Respiratory:  appears well, vitals normal, no respiratory distress, acyanotic, normal RR, ear and throat exam is normal, neck free of mass or lymphadenopathy, chest clear, no wheezing, crepitations, rhonchi, normal symmetric air entry   Abdomen: soft, non-tender; bowel sounds normal; no masses,  no organomegaly   Urinary: urethral meatus normal      Assessment:    Pregnancy: G3P1011 Patient Active Problem List   Diagnosis Date Noted  . Pregnancy 05/30/2015  . 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 Anemia in pregnancy 03/06/2015  . 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 Supervision of high risk pregnancy, antepartum 11/08/2014  . Drug use affecting pregnancy 11/08/2014  . Eczema 06/06/2014  . Anxiety and depression 04/16/2011      Plan:   1. Supervision of high risk pregnancy, antepartum Discussed diclegis for N/V - patient preferred to get dicyclomine  and Vit B-6 otc. PAP done. PNL. Patient is planning on BF. CF testing done in previous pregnancy - will not repeat.   - Cytology - PAP - Obstetric Panel, Including HIV - CULTURE, URINE COMPREHENSIVE - [place] MFM OB COMP + 14 WK; Future - [place] MFM Fetal Nuchal Translucency; Future  2. Pregnancy complicated by Suboxone maintenance, antepartum (HCC) Gave information about [facility] for suboxone management  3. Low grade squamous intraepith lesion on cytologic smear cervix (lgsil) PAP done. Discussed that she may need colposcopy due to abnormal cells.    Problem list reviewed and updated. 100% of 30 min visit spent on counseling and coordination of care.     Levie Heritage 07/17/2016

## 2016-08-15 LAB — OBSTETRIC PANEL, INCLUDING HIV
ANTIBODY SCREEN: NEGATIVE
Basophils Absolute: 0 10*3/uL (ref 0.0–0.2)
Basos: 0 %
EOS (ABSOLUTE): 0.1 10*3/uL (ref 0.0–0.4)
Eos: 1 %
HIV SCREEN 4TH GENERATION: NONREACTIVE
Hematocrit: 36.4 % (ref 34.0–46.6)
Hemoglobin: 12.1 g/dL (ref 11.1–15.9)
Hepatitis B Surface Ag: NEGATIVE
IMMATURE GRANS (ABS): 0 10*3/uL (ref 0.0–0.1)
Immature Granulocytes: 0 %
LYMPHS ABS: 1.1 10*3/uL (ref 0.7–3.1)
Lymphs: 14 %
MCH: 30 pg (ref 26.6–33.0)
MCHC: 33.2 g/dL (ref 31.5–35.7)
MCV: 90 fL (ref 79–97)
Monocytes Absolute: 0.5 10*3/uL (ref 0.1–0.9)
Monocytes: 6 %
NEUTROS ABS: 6.6 10*3/uL (ref 1.4–7.0)
Neutrophils: 79 %
PLATELETS: 240 10*3/uL (ref 150–379)
RBC: 4.04 x10E6/uL (ref 3.77–5.28)
RDW: 13.3 % (ref 12.3–15.4)
RPR Ser Ql: NONREACTIVE
RUBELLA: 1 {index} (ref >0.99)
Rh Factor: POSITIVE
WBC: 8.2 10*3/uL (ref 3.4–10.8)

## 2016-08-17 LAB — CULTURE, URINE COMPREHENSIVE

## 2016-08-19 ENCOUNTER — Encounter: Payer: Self-pay | Admitting: Family Medicine

## 2016-08-19 ENCOUNTER — Other Ambulatory Visit: Payer: Self-pay | Admitting: Family Medicine

## 2016-08-19 DIAGNOSIS — O99321 Drug use complicating pregnancy, first trimester: Secondary | ICD-10-CM

## 2016-08-19 LAB — CYTOLOGY - PAP
CHLAMYDIA, DNA PROBE: NEGATIVE
HPV (WINDOPATH): NOT DETECTED
Neisseria Gonorrhea: NEGATIVE

## 2016-08-21 ENCOUNTER — Other Ambulatory Visit: Payer: Self-pay | Admitting: Family Medicine

## 2016-08-21 ENCOUNTER — Encounter: Payer: Self-pay | Admitting: Family Medicine

## 2016-08-21 DIAGNOSIS — F1120 Opioid dependence, uncomplicated: Secondary | ICD-10-CM

## 2016-08-21 DIAGNOSIS — O99320 Drug use complicating pregnancy, unspecified trimester: Principal | ICD-10-CM

## 2016-08-21 NOTE — Telephone Encounter (Signed)
Referral to [facility] done. The address is [address], [place], [place] [address]. Their phone number is [phone]

## 2016-08-24 MED ORDER — ONDANSETRON 4 MG PO TBDP: 4.0000 mg | ORAL_TABLET | Freq: Four times a day (QID) | ORAL | 0 refills | Status: DC | PRN

## 2016-09-08 ENCOUNTER — Ambulatory Visit (HOSPITAL_COMMUNITY)
Admission: RE | Admit: 2016-09-08 | Discharge: 2016-09-08 | Disposition: A | Discharge status: Home / Self Care | Payer: Medicaid Other | Source: Ambulatory Visit | Attending: Family Medicine | Admitting: Family Medicine

## 2016-09-08 ENCOUNTER — Other Ambulatory Visit: Payer: Self-pay | Admitting: Family Medicine

## 2016-09-08 ENCOUNTER — Encounter (HOSPITAL_COMMUNITY): Payer: Self-pay

## 2016-09-08 DIAGNOSIS — O99321 Drug use complicating pregnancy, first trimester: Secondary | ICD-10-CM

## 2016-09-08 DIAGNOSIS — O0990 Supervision of high risk pregnancy, unspecified, unspecified trimester: Secondary | ICD-10-CM | POA: Diagnosis present

## 2016-09-08 DIAGNOSIS — Z3A12 12 weeks gestation of pregnancy: Secondary | ICD-10-CM | POA: Insufficient documentation

## 2016-09-08 DIAGNOSIS — Z3682 Encounter for antenatal screening for nuchal translucency: Secondary | ICD-10-CM | POA: Diagnosis present

## 2016-09-08 DIAGNOSIS — O99342 Other mental disorders complicating pregnancy, second trimester: Secondary | ICD-10-CM | POA: Diagnosis not present

## 2016-09-08 DIAGNOSIS — O0992 Supervision of high risk pregnancy, unspecified, second trimester: Secondary | ICD-10-CM | POA: Insufficient documentation

## 2016-09-08 DIAGNOSIS — O99341 Other mental disorders complicating pregnancy, first trimester: Secondary | ICD-10-CM

## 2016-09-08 DIAGNOSIS — F1920 Other psychoactive substance dependence, uncomplicated: Secondary | ICD-10-CM | POA: Diagnosis not present

## 2016-09-08 DIAGNOSIS — F329 Major depressive disorder, single episode, unspecified: Secondary | ICD-10-CM | POA: Insufficient documentation

## 2016-09-08 DIAGNOSIS — O99322 Drug use complicating pregnancy, second trimester: Secondary | ICD-10-CM | POA: Diagnosis not present

## 2016-09-08 DIAGNOSIS — F419 Anxiety disorder, unspecified: Secondary | ICD-10-CM | POA: Insufficient documentation

## 2016-09-11 ENCOUNTER — Other Ambulatory Visit: Payer: Self-pay

## 2016-09-16 ENCOUNTER — Encounter: Payer: Medicaid Other | Admitting: Obstetrics & Gynecology

## 2016-09-18 ENCOUNTER — Other Ambulatory Visit (HOSPITAL_COMMUNITY)
Admission: RE | Admit: 2016-09-18 | Discharge: 2016-09-18 | Disposition: A | Discharge status: Home / Self Care | Payer: Medicaid Other | Source: Ambulatory Visit | Attending: Obstetrics & Gynecology | Admitting: Obstetrics & Gynecology

## 2016-09-18 ENCOUNTER — Ambulatory Visit (INDEPENDENT_AMBULATORY_CARE_PROVIDER_SITE_OTHER): Payer: Medicaid Other | Admitting: Obstetrics & Gynecology

## 2016-09-18 VITALS — BP 105/58 | HR 75 | Wt 112.0 lb

## 2016-09-18 DIAGNOSIS — N898 Other specified noninflammatory disorders of vagina: Secondary | ICD-10-CM

## 2016-09-18 DIAGNOSIS — Z3A14 14 weeks gestation of pregnancy: Secondary | ICD-10-CM | POA: Diagnosis not present

## 2016-09-18 DIAGNOSIS — O26892 Other specified pregnancy related conditions, second trimester: Secondary | ICD-10-CM | POA: Diagnosis not present

## 2016-09-18 DIAGNOSIS — F1120 Opioid dependence, uncomplicated: Secondary | ICD-10-CM

## 2016-09-18 DIAGNOSIS — O0992 Supervision of high risk pregnancy, unspecified, second trimester: Secondary | ICD-10-CM | POA: Diagnosis not present

## 2016-09-18 DIAGNOSIS — O99320 Drug use complicating pregnancy, unspecified trimester: Secondary | ICD-10-CM

## 2016-09-18 DIAGNOSIS — O99322 Drug use complicating pregnancy, second trimester: Secondary | ICD-10-CM

## 2016-09-18 DIAGNOSIS — R87610 Atypical squamous cells of undetermined significance on cytologic smear of cervix (ASC-US): Secondary | ICD-10-CM | POA: Diagnosis not present

## 2016-09-18 DIAGNOSIS — O0990 Supervision of high risk pregnancy, unspecified, unspecified trimester: Secondary | ICD-10-CM

## 2016-09-18 NOTE — Progress Notes (Signed)
   PRENATAL VISIT NOTE  Subjective:  Olivia Faulkner is a 31 y.o. G3P1011 at 1751w0d being seen today for ongoing prenatal care.  She is currently monitored for the following issues for this high-risk pregnancy and has Anxiety and depression; Eczema; Pregnancy complicated by Suboxone maintenance, antepartum (HCC); Supervision of high risk pregnancy, antepartum; Drug use affecting pregnancy; Anemia in pregnancy; Low grade squamous intraepith lesion on cytologic smear cervix (lgsil); and Pregnancy on her problem list.  Patient reports reports fishy smelling vagina discharge.  Contractions: Not present. Vag. Bleeding: None.   . Denies leaking of fluid.   The following portions of the patient's history were reviewed and updated as appropriate: allergies, current medications, past family history, past medical history, past social history, past surgical history and problem list. Problem list updated.  Objective:   Vitals:   09/18/16 1006  BP: (!) 105/58  Pulse: 75  Weight: 112 lb (50.8 kg)    Fetal Status: Fetal Heart Rate (bpm): 154         General:  Alert, oriented and cooperative. Patient is in no acute distress.  Skin: Skin is warm and dry. No rash noted.   Cardiovascular: Normal heart rate noted  Respiratory: Normal respiratory effort, no problems with respiration noted  Abdomen: Soft, gravid, appropriate for gestational age. Pain/Pressure: Absent     Pelvic:  reports fishy vaginal discharge        Extremities: Normal range of motion.  Edema: None  Mental Status: Normal mood and affect. Normal behavior. Normal judgment and thought content.   Assessment and Plan:  Pregnancy: G3P1011 at 6451w0d  1. Pap smear abnormality of cervix with ASCUS favoring dysplasia S/p coplo 09/18/2016 Needs repeat PAP PP  2. Supervision of high risk pregnancy, antepartum  - US MFM OB DETAIL +14 WK; Future in 4 weeks  3. 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 Please refer to After Visit Summary for other counseling recommendations.  Return in about 4 weeks (around 10/16/2016).   Willodean Rosenthalarolyn Harraway-Smith, MD

## 2016-09-18 NOTE — Patient Instructions (Signed)

## 2016-09-18 NOTE — Addendum Note (Signed)
Addended by: Anell BarrHOWARD, JENNIFER L on: 09/18/2016 10:39 AM   Modules accepted: Orders

## 2016-09-18 NOTE — Progress Notes (Signed)
Patient complaining of discharge = white and has an odor. Patient presents for colposcopy. Armandina StammerJennifer Moise Friday RN BSN

## 2016-09-18 NOTE — Progress Notes (Signed)
Patient given informed consent, signed copy in the chart, time out was performed.  Placed in lithotomy position. Cervix viewed with speculum and colposcope after application of acetic acid.  08/14/2016 Satisfactory for evaluation endocervical/transformation zone component PRESENT.     Diagnosis ATYPICAL SQUAMOUS CELLS CANNOT EXCLUDE A HIGH GRADE SQUAMOUS INTRAEPITHELIAL LESION (ASC-H).    HPV NOT DETECTED   Comment: Normal Reference Range - NOT Detected  Chlamydia Negative   Comment: Normal Reference Range - Negative  Neisseria gonorrhea Negative   Comment: Normal Reference Range - Negative  Material Submitted CervicoVaginal Pap [ThinPrep Imaged]      Colposcopy adequate?  yes Acetowhite lesions? no Punctation? no Mosaicism?  no Abnormal vasculature?  no Biopsies? no ECC? no   Patient was given post procedure instructions. Pt needs a repeat PAP PP Tyrrell Stephens L. Harraway-Smith, M.D., Evern CoreFACOG

## 2016-09-23 LAB — CERVICOVAGINAL ANCILLARY ONLY
BACTERIAL VAGINITIS: NEGATIVE
CANDIDA VAGINITIS: NEGATIVE

## 2016-10-06 ENCOUNTER — Encounter: Payer: Self-pay | Admitting: Family Medicine

## 2016-10-06 ENCOUNTER — Telehealth: Payer: Self-pay | Admitting: *Deleted

## 2016-10-06 DIAGNOSIS — O219 Vomiting of pregnancy, unspecified: Secondary | ICD-10-CM

## 2016-10-06 MED ORDER — DOXYLAMINE-PYRIDOXINE 10-10 MG PO TBEC: 1.0000 | DELAYED_RELEASE_TABLET | Freq: Every day | ORAL | 3 refills | Status: DC

## 2016-10-06 NOTE — Telephone Encounter (Signed)
Pt sent an email through my chart requesting Diclegis instead of the Zofran she had been given.  She stated she was afraid to take the Zofran.  A RX for Diclegis was sent to her pharamcy

## 2016-10-21 ENCOUNTER — Ambulatory Visit (INDEPENDENT_AMBULATORY_CARE_PROVIDER_SITE_OTHER): Payer: Medicaid Other | Admitting: Obstetrics & Gynecology

## 2016-10-21 ENCOUNTER — Other Ambulatory Visit: Payer: Self-pay | Admitting: Obstetrics & Gynecology

## 2016-10-21 ENCOUNTER — Ambulatory Visit (HOSPITAL_COMMUNITY)
Admission: RE | Admit: 2016-10-21 | Discharge: 2016-10-21 | Disposition: A | Discharge status: Home / Self Care | Payer: Medicaid Other | Source: Ambulatory Visit | Attending: Obstetrics & Gynecology | Admitting: Obstetrics & Gynecology

## 2016-10-21 VITALS — BP 96/67 | HR 73 | Wt 117.0 lb

## 2016-10-21 DIAGNOSIS — Z3A18 18 weeks gestation of pregnancy: Secondary | ICD-10-CM | POA: Insufficient documentation

## 2016-10-21 DIAGNOSIS — O0992 Supervision of high risk pregnancy, unspecified, second trimester: Secondary | ICD-10-CM

## 2016-10-21 DIAGNOSIS — Z363 Encounter for antenatal screening for malformations: Secondary | ICD-10-CM | POA: Diagnosis not present

## 2016-10-21 DIAGNOSIS — F1920 Other psychoactive substance dependence, uncomplicated: Secondary | ICD-10-CM | POA: Diagnosis not present

## 2016-10-21 DIAGNOSIS — R87612 Low grade squamous intraepithelial lesion on cytologic smear of cervix (LGSIL): Secondary | ICD-10-CM

## 2016-10-21 DIAGNOSIS — O0990 Supervision of high risk pregnancy, unspecified, unspecified trimester: Secondary | ICD-10-CM

## 2016-10-21 DIAGNOSIS — O99322 Drug use complicating pregnancy, second trimester: Secondary | ICD-10-CM | POA: Insufficient documentation

## 2016-10-21 DIAGNOSIS — F1120 Opioid dependence, uncomplicated: Secondary | ICD-10-CM

## 2016-10-21 DIAGNOSIS — Q270 Congenital absence and hypoplasia of umbilical artery: Secondary | ICD-10-CM | POA: Insufficient documentation

## 2016-10-21 DIAGNOSIS — O99012 Anemia complicating pregnancy, second trimester: Secondary | ICD-10-CM

## 2016-10-21 DIAGNOSIS — F419 Anxiety disorder, unspecified: Secondary | ICD-10-CM

## 2016-10-21 DIAGNOSIS — O99320 Drug use complicating pregnancy, unspecified trimester: Secondary | ICD-10-CM

## 2016-10-21 DIAGNOSIS — O99342 Other mental disorders complicating pregnancy, second trimester: Secondary | ICD-10-CM | POA: Diagnosis not present

## 2016-10-21 DIAGNOSIS — O09899 Supervision of other high risk pregnancies, unspecified trimester: Secondary | ICD-10-CM

## 2016-10-21 DIAGNOSIS — F329 Major depressive disorder, single episode, unspecified: Secondary | ICD-10-CM

## 2016-10-21 NOTE — Progress Notes (Signed)
   PRENATAL VISIT NOTE  Subjective:  Olivia Faulkner is a 31 y.o. G3P1011 at 3576w5d being seen today for ongoing prenatal care.  She is currently monitored for the following issues for this high-risk pregnancy and has Anxiety and depression; Eczema; Pregnancy complicated by Suboxone maintenance, antepartum (HCC); Supervision of high risk pregnancy, antepartum; Drug use affecting pregnancy; Anemia in pregnancy; Low grade squamous intraepith lesion on cytologic smear cervix (lgsil); and Pregnancy on her problem list.   Patient reports no complaints. Nausea has resolved.   Contractions: Not present. Vag. Bleeding: None.   . Denies leaking of fluid.   The following portions of the patient's history were reviewed and updated as appropriate: allergies, current medications, past family history, past medical history, past social history, past surgical history and problem list. Problem list updated.  Objective:   Vitals:   10/21/16 1553  BP: 96/67  Pulse: 73  Weight: 117 lb (53.1 kg)    Fetal Status: Fetal Heart Rate (bpm): 155         General:  Alert, oriented and cooperative. Patient is in no acute distress.  Skin: Skin is warm and dry. No rash noted.   Cardiovascular: Normal heart rate noted  Respiratory: Normal respiratory effort, no problems with respiration noted  Abdomen: Soft, gravid, appropriate for gestational age.  Pain/Pressure: Absent     Pelvic: Cervical exam deferred        Extremities: Normal range of motion.  Edema: None  Mental Status:  Normal mood and affect. Normal behavior. Normal judgment and thought content.   Assessment and Plan:  Pregnancy: G3P1011 at 8476w5d  1. Supervision of high risk pregnancy, antepartum Quad screen today    2. 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 Low grade squamous intraepith lesion on cytologic smear cervix (lgsil)  4. Drug use affecting pregnancy in second  trimester  5. Anxiety and depression  6. Anemia during pregnancy in second trimester  7. Single umbilical artery Normal anatomy scan   Preterm labor symptoms and general obstetric precautions including but not limited to vaginal bleeding, contractions, leaking of fluid and fetal movement were reviewed in detail with the patient. Please refer to After Visit Summary for other counseling recommendations.  Return in about 4 weeks (around 11/18/2016).   Willodean Rosenthalarolyn Harraway-Smith, MD

## 2016-10-21 NOTE — Patient Instructions (Signed)

## 2016-10-23 ENCOUNTER — Encounter (HOSPITAL_COMMUNITY): Payer: Self-pay

## 2016-10-23 ENCOUNTER — Ambulatory Visit (HOSPITAL_COMMUNITY): Payer: Medicaid Other

## 2016-10-25 ENCOUNTER — Other Ambulatory Visit: Payer: Self-pay | Admitting: Obstetrics & Gynecology

## 2016-10-25 DIAGNOSIS — O99322 Drug use complicating pregnancy, second trimester: Secondary | ICD-10-CM

## 2016-10-25 DIAGNOSIS — O09899 Supervision of other high risk pregnancies, unspecified trimester: Secondary | ICD-10-CM

## 2016-10-28 LAB — AFP TETRA
DIA Mom Value: 0.77
DIA VALUE (EIA): 154.94 pg/mL
DSR (By Age)    1 IN: 741
DSR (SECOND TRIMESTER) 1 IN: 10000
GESTATIONAL AGE AFP: 18.5 wk
MSAFP Mom: 1.16
MSAFP: 62.6 ng/mL
MSHCG Mom: 0.36
MSHCG: 11174 m[IU]/mL
Maternal Age At EDD: 29.5 yr
Osb Risk: 7366
Test Results:: NEGATIVE
UE3 MOM: 1.39
UE3 VALUE: 2.11 ng/mL
WEIGHT: 117 [lb_av]

## 2016-10-30 DIAGNOSIS — F1110 Opioid abuse, uncomplicated: Secondary | ICD-10-CM | POA: Insufficient documentation

## 2016-10-30 DIAGNOSIS — F1121 Opioid dependence, in remission: Secondary | ICD-10-CM | POA: Insufficient documentation

## 2016-11-17 ENCOUNTER — Encounter: Payer: Self-pay | Admitting: Obstetrics & Gynecology

## 2016-11-18 ENCOUNTER — Encounter: Payer: Medicaid Other | Admitting: Obstetrics & Gynecology

## 2016-11-19 ENCOUNTER — Ambulatory Visit (INDEPENDENT_AMBULATORY_CARE_PROVIDER_SITE_OTHER): Payer: Medicaid Other | Admitting: Family Medicine

## 2016-11-19 VITALS — BP 92/59 | HR 80 | Wt 125.0 lb

## 2016-11-19 DIAGNOSIS — O09899 Supervision of other high risk pregnancies, unspecified trimester: Secondary | ICD-10-CM

## 2016-11-19 DIAGNOSIS — L308 Other specified dermatitis: Secondary | ICD-10-CM

## 2016-11-19 DIAGNOSIS — O09892 Supervision of other high risk pregnancies, second trimester: Secondary | ICD-10-CM

## 2016-11-19 DIAGNOSIS — O0990 Supervision of high risk pregnancy, unspecified, unspecified trimester: Secondary | ICD-10-CM

## 2016-11-19 DIAGNOSIS — O0992 Supervision of high risk pregnancy, unspecified, second trimester: Secondary | ICD-10-CM

## 2016-11-19 MED ORDER — TRIAMCINOLONE ACETONIDE 0.5 % EX CREA: 1.0000 "application " | TOPICAL_CREAM | Freq: Three times a day (TID) | CUTANEOUS | 3 refills | Status: DC

## 2016-11-19 NOTE — Progress Notes (Signed)
Patient complaining of eczema on hands and arms. Has flared up a lot in last 3- 5 days. Armandina StammerJennifer Adryen Cookson RNBSN

## 2016-11-19 NOTE — Progress Notes (Signed)
   PRENATAL VISIT NOTE  Subjective:  Olivia Faulkner is a 31 y.o. G3P1011 at 2649w6d being seen today for ongoing prenatal care.  She is currently monitored for the following issues for this high-risk pregnancy and has Anxiety and depression; Eczema; Pregnancy complicated by Suboxone maintenance, antepartum (HCC); Supervision of high risk pregnancy, antepartum; Drug use affecting pregnancy; Anemia in pregnancy; Low grade squamous intraepith lesion on cytologic smear cervix (lgsil); Pregnancy; and Single umbilical artery affecting management of mother, antepartum, single gestation on her problem list.  Patient reports itching on arms and ankle - has hx/o eczema and was using cream left over from last pregnancy..  Contractions: Not present. Vag. Bleeding: None.  Movement: Present. Denies leaking of fluid.   The following portions of the patient's history were reviewed and updated as appropriate: allergies, current medications, past family history, past medical history, past social history, past surgical history and problem list. Problem list updated.  Objective:   Vitals:   11/19/16 1032  BP: (!) 92/59  Pulse: 80  Weight: 125 lb (56.7 kg)    Fetal Status: Fetal Heart Rate (bpm): 145   Movement: Present     General:  Alert, oriented and cooperative. Patient is in no acute distress.  Skin: Skin is warm and dry. No rash noted.   Cardiovascular: Normal heart rate noted  Respiratory: Normal respiratory effort, no problems with respiration noted  Abdomen: Soft, gravid, appropriate for gestational age.  Pain/Pressure: Present     Pelvic: Cervical exam deferred        Extremities: Normal range of motion.  Edema: None  Mental Status:  Normal mood and affect. Normal behavior. Normal judgment and thought content.   Assessment and Plan:  Pregnancy: G3P1011 at 1949w6d  1. Supervision of high risk pregnancy, antepartum FHT and FH normal  2. Single umbilical artery affecting management of mother,  antepartum, single gestation Has repeat US on 8/29.  3. Other eczema Triamcinolone cream  Preterm labor symptoms and general obstetric precautions including but not limited to vaginal bleeding, contractions, leaking of fluid and fetal movement were reviewed in detail with the patient. Please refer to After Visit Summary for other counseling recommendations.  Return in about 4 weeks (around 12/17/2016) for OB f/u.   Levie HeritageJacob J Stinson, DO

## 2016-11-20 ENCOUNTER — Encounter: Payer: Self-pay | Admitting: Family Medicine

## 2016-11-22 ENCOUNTER — Encounter: Payer: Self-pay | Admitting: Family Medicine

## 2016-11-26 IMAGING — US US MFM OB COMPLETE +14 WKS
1 series · 13 of 28 positions shown · non-contrast
Comparison: none

[Series 1: us mfm ob complete +14 wks · 0.21mm/px · 13 of 32 slices shown]
[im 2/32]
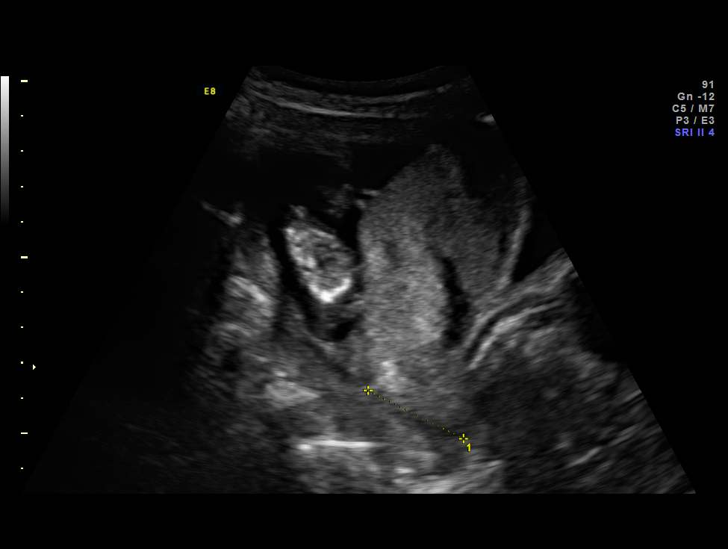
[im 4/32]
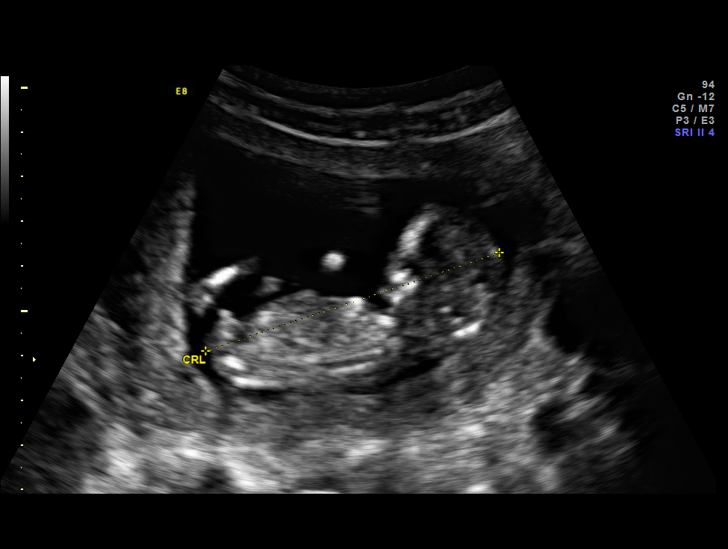
[im 6/32]
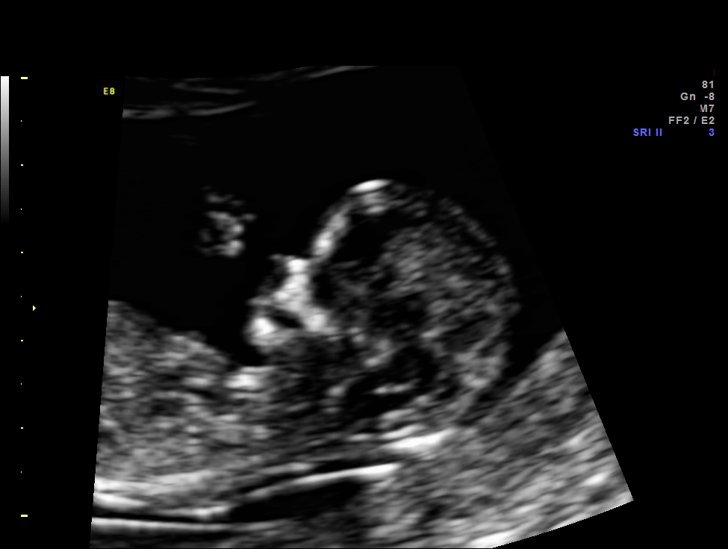
[im 9/32]
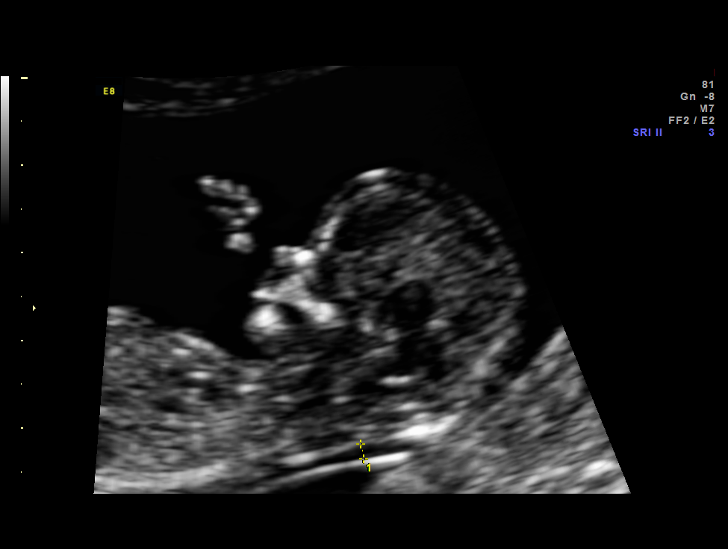
[im 11/32]
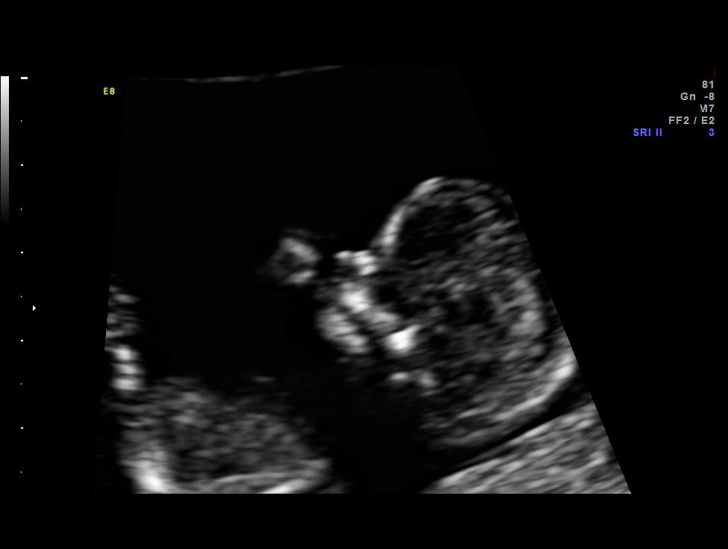
[im 13/32]
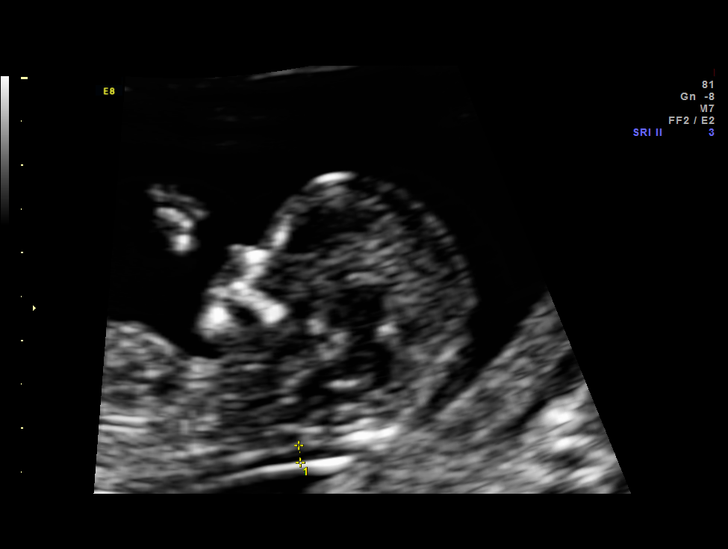
[im 17/32]
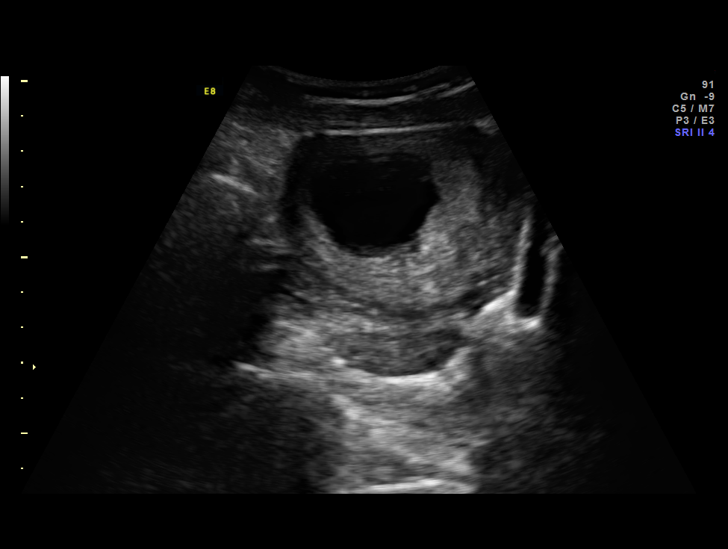
[im 19/32]
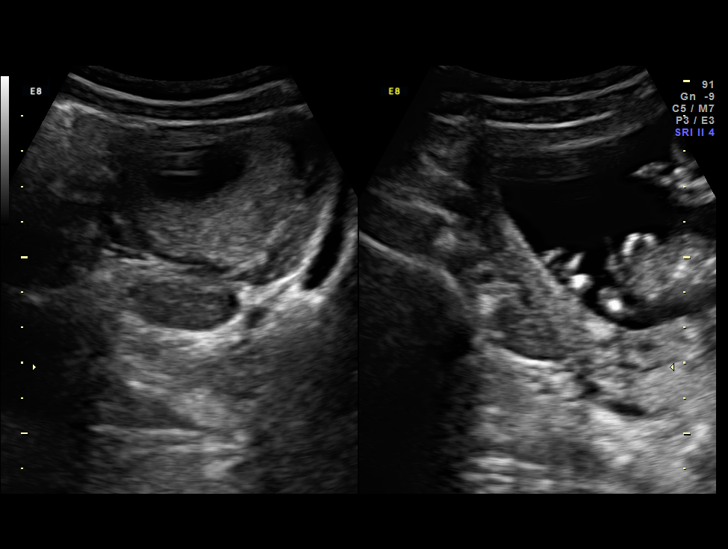
[im 21/32]
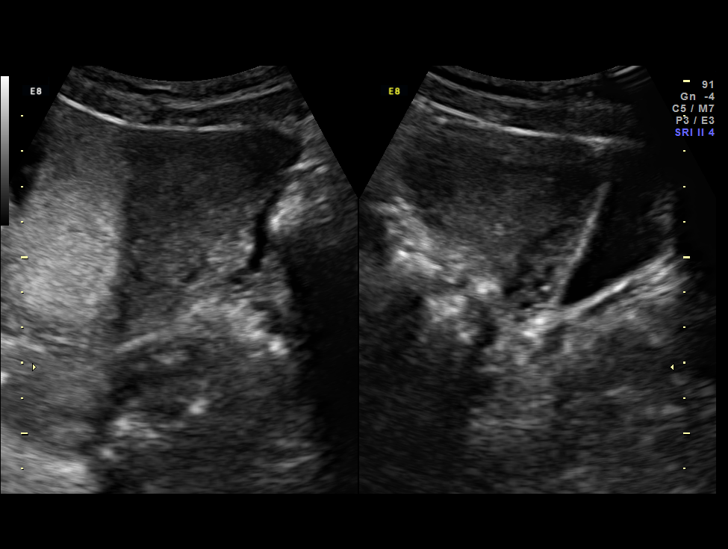
[im 23/32]
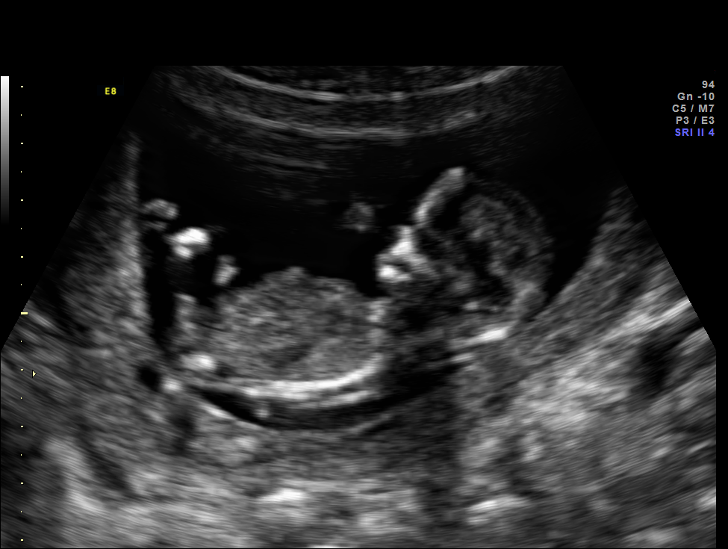
[im 26/32]
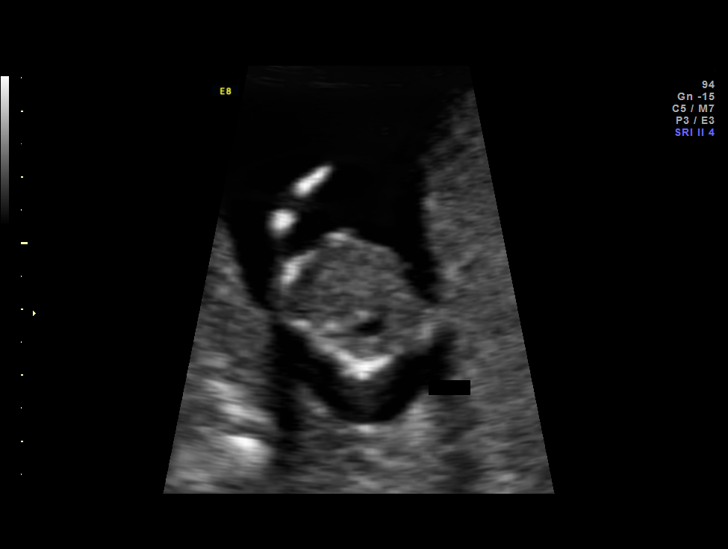
[im 28/32]
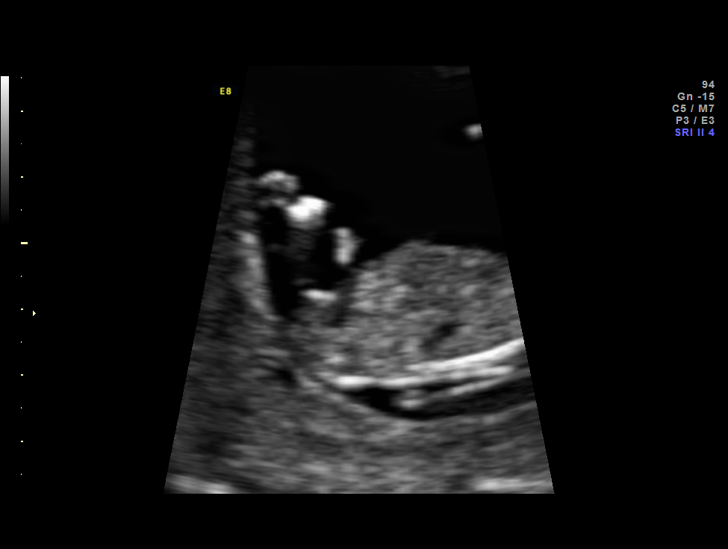
[im 30/32]
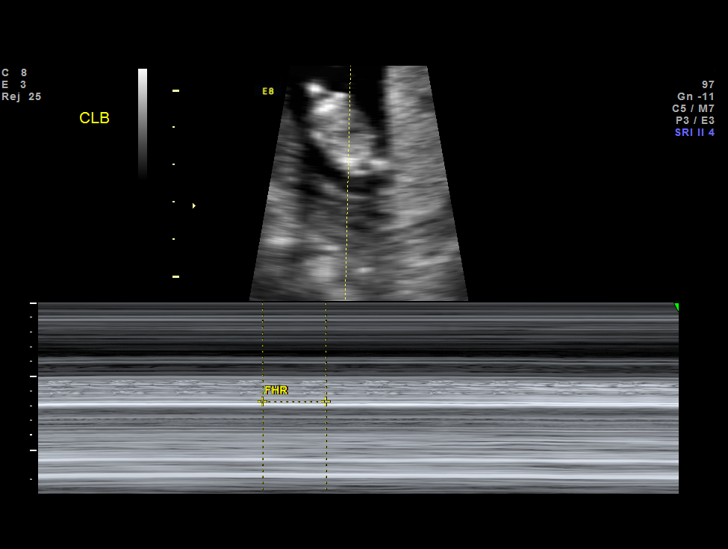

[13 of 28 positions shown; findings below may reference images not displayed]

OBSTETRICS REPORT
(Signed Final 11/21/2014 [DATE])

Name:       LUI LUI RIRIN                       Visit  11/21/2014 [DATE]
Date:

Service(s) Provided

US MFM OB COMP LESS THAN 14 WEEKS                      76801.4
Indications

13 weeks gestation of pregnancy
Drug dependence complicating
pregnancy,antepartum condition or complication
(methadone)
Uncertain LMP  Establish Gestational Age               Z36
Fetal Evaluation

Num Of             1
Fetuses:
Preg. Location:    Intrauterine
Gest. Sac:         Intrauterine
Fetal Pole:        Visualized
Fetal Heart        159                          bpm
Rate:
Cardiac Activity:  Observed
Biometry

CRL:       73   m    G. Age:   13w 1d                  EDD:   05/28/15
m
Gestational Age

LMP:           18w 0d        Date:  07/18/14                  EDD:   04/24/15
Best:          13w 1d    Det. By:   U/S C R L (11/21/14)      EDD:   05/28/15
Cervix Uterus Adnexa

Cervix:       Closed.
Uterus:       No abnormality visualized.
Cul De Sac:   No free fluid seen.

Left Ovary:    Within normal limits.
Right Ovary:   Within normal limits.

Adnexa:     No abnormality visualized.
Impression

SIUP at 13+1 weeks
No gross abnormalities identified
NT measurement was within normal limits for this GA; NB
present
Normal amniotic fluid volume
EDC based on today's measurements: 05/28/15

Ms. Reyes declined first trimester screening today but is
considering a quad screen in the second trimester.
Recommendations

Offer quad screen in the second trimester
Follow-up ultrasound in 5-6 weeks for detailed anatomic
survey

## 2016-12-02 ENCOUNTER — Encounter (HOSPITAL_COMMUNITY): Payer: Self-pay

## 2016-12-02 ENCOUNTER — Ambulatory Visit (HOSPITAL_COMMUNITY)
Admission: RE | Admit: 2016-12-02 | Discharge: 2016-12-02 | Disposition: A | Discharge status: Home / Self Care | Payer: Medicaid Other | Source: Ambulatory Visit | Attending: Obstetrics & Gynecology | Admitting: Obstetrics & Gynecology

## 2016-12-02 DIAGNOSIS — F329 Major depressive disorder, single episode, unspecified: Secondary | ICD-10-CM | POA: Diagnosis not present

## 2016-12-02 DIAGNOSIS — F419 Anxiety disorder, unspecified: Secondary | ICD-10-CM | POA: Diagnosis not present

## 2016-12-02 DIAGNOSIS — Z362 Encounter for other antenatal screening follow-up: Secondary | ICD-10-CM | POA: Insufficient documentation

## 2016-12-02 DIAGNOSIS — O283 Abnormal ultrasonic finding on antenatal screening of mother: Secondary | ICD-10-CM | POA: Insufficient documentation

## 2016-12-02 DIAGNOSIS — O99342 Other mental disorders complicating pregnancy, second trimester: Secondary | ICD-10-CM | POA: Insufficient documentation

## 2016-12-02 DIAGNOSIS — O09899 Supervision of other high risk pregnancies, unspecified trimester: Secondary | ICD-10-CM

## 2016-12-02 DIAGNOSIS — Z3A24 24 weeks gestation of pregnancy: Secondary | ICD-10-CM | POA: Diagnosis not present

## 2016-12-02 DIAGNOSIS — O99322 Drug use complicating pregnancy, second trimester: Secondary | ICD-10-CM

## 2016-12-02 NOTE — Addendum Note (Signed)
Encounter addended by: Genevie CheshireWaken, Madell Heino M, RT on: 12/02/2016  4:17 PM<BR>    Actions taken: Imaging Exam ended

## 2016-12-18 ENCOUNTER — Encounter: Payer: Medicaid Other | Admitting: Family Medicine

## 2016-12-21 ENCOUNTER — Encounter: Payer: Medicaid Other | Admitting: Family Medicine

## 2016-12-23 ENCOUNTER — Other Ambulatory Visit (HOSPITAL_COMMUNITY): Payer: Self-pay | Admitting: *Deleted

## 2016-12-23 DIAGNOSIS — Z3689 Encounter for other specified antenatal screening: Secondary | ICD-10-CM

## 2016-12-26 ENCOUNTER — Encounter: Payer: Self-pay | Admitting: Family Medicine

## 2017-01-06 ENCOUNTER — Encounter: Payer: Medicaid Other | Admitting: Family Medicine

## 2017-01-08 ENCOUNTER — Ambulatory Visit (INDEPENDENT_AMBULATORY_CARE_PROVIDER_SITE_OTHER): Payer: Medicaid Other | Admitting: Family Medicine

## 2017-01-08 VITALS — BP 111/69 | HR 105 | Wt 131.0 lb

## 2017-01-08 DIAGNOSIS — O09899 Supervision of other high risk pregnancies, unspecified trimester: Secondary | ICD-10-CM

## 2017-01-08 DIAGNOSIS — O99320 Drug use complicating pregnancy, unspecified trimester: Secondary | ICD-10-CM

## 2017-01-08 DIAGNOSIS — O0990 Supervision of high risk pregnancy, unspecified, unspecified trimester: Secondary | ICD-10-CM

## 2017-01-08 DIAGNOSIS — O99013 Anemia complicating pregnancy, third trimester: Secondary | ICD-10-CM

## 2017-01-08 DIAGNOSIS — F1120 Opioid dependence, uncomplicated: Secondary | ICD-10-CM

## 2017-01-08 DIAGNOSIS — O09893 Supervision of other high risk pregnancies, third trimester: Secondary | ICD-10-CM

## 2017-01-08 DIAGNOSIS — O99323 Drug use complicating pregnancy, third trimester: Secondary | ICD-10-CM

## 2017-01-08 DIAGNOSIS — O0993 Supervision of high risk pregnancy, unspecified, third trimester: Secondary | ICD-10-CM

## 2017-01-08 NOTE — Progress Notes (Signed)
   PRENATAL VISIT NOTE  Subjective:  Olivia Faulkner is a 31 y.o. G3P1011 at [redacted]w[redacted]d being seen today for ongoing prenatal care.  She is currently monitored for the following issues for this high-risk pregnancy and has Anxiety and depression; Eczema; Pregnancy complicated by Suboxone maintenance, antepartum (HCC); Supervision of high risk pregnancy, antepartum; Drug use affecting pregnancy; Anemia in pregnancy; Low grade squamous intraepith lesion on cytologic smear cervix (lgsil); Pregnancy; and Single umbilical artery affecting management of mother, antepartum, single gestation on her problem list.  Patient reports no complaints.  Contractions: Not present. Vag. Bleeding: None.  Movement: Present. Denies leaking of fluid.   The following portions of the patient's history were reviewed and updated as appropriate: allergies, current medications, past family history, past medical history, past social history, past surgical history and problem list. Problem list updated.  Objective:   Vitals:   01/08/17 1043  BP: 111/69  Pulse: (!) 105  Weight: 131 lb (59.4 kg)    Fetal Status: Fetal Heart Rate (bpm): 132   Movement: Present     General:  Alert, oriented and cooperative. Patient is in no acute distress.  Skin: Skin is warm and dry. No rash noted.   Cardiovascular: Normal heart rate noted  Respiratory: Normal respiratory effort, no problems with respiration noted  Abdomen: Soft, gravid, appropriate for gestational age.  Pain/Pressure: Present     Pelvic: Cervical exam deferred        Extremities: Normal range of motion.  Edema: None  Mental Status:  Normal mood and affect. Normal behavior. Normal judgment and thought content.   Assessment and Plan:  Pregnancy: G3P1011 at [redacted]w[redacted]d  1. Supervision of high risk pregnancy, antepartum FH normal. Will get 28 week labs - pt to come back next week for nurse visit for 2hr GTT.  2. 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   3. Single umbilical artery affecting management of mother, antepartum, single gestation Has [place] for growth on 10/10.  4. Anemia during pregnancy in third trimester  Preterm labor symptoms and general obstetric precautions including but not limited to vaginal bleeding, contractions, leaking of fluid and fetal movement were reviewed in detail with the patient. Please refer to After Visit Summary for other counseling recommendations.  Return in about 2 weeks (around 01/22/2017) for OB f/u.   Levie Heritage, DO

## 2017-01-11 ENCOUNTER — Other Ambulatory Visit: Payer: Medicaid Other

## 2017-01-11 DIAGNOSIS — Z3490 Encounter for supervision of normal pregnancy, unspecified, unspecified trimester: Secondary | ICD-10-CM

## 2017-01-11 NOTE — Progress Notes (Signed)
Patient presents for 2 hr gtt- sent to lab

## 2017-01-12 LAB — GLUCOSE TOLERANCE, 2 HOURS W/ 1HR
GLUCOSE, FASTING: 96 mg/dL — AB (ref 65–91)
Glucose, 1 hour: 93 mg/dL (ref 65–179)
Glucose, 2 hour: 92 mg/dL (ref 65–152)

## 2017-01-12 LAB — CBC
HEMATOCRIT: 31.4 % — AB (ref 34.0–46.6)
Hemoglobin: 10.2 g/dL — ABNORMAL LOW (ref 11.1–15.9)
MCH: 30.3 pg (ref 26.6–33.0)
MCHC: 32.5 g/dL (ref 31.5–35.7)
MCV: 93 fL (ref 79–97)
Platelets: 192 10*3/uL (ref 150–379)
RBC: 3.37 x10E6/uL — ABNORMAL LOW (ref 3.77–5.28)
RDW: 13 % (ref 12.3–15.4)
WBC: 9.9 10*3/uL (ref 3.4–10.8)

## 2017-01-12 LAB — RPR: RPR Ser Ql: NONREACTIVE

## 2017-01-12 LAB — HIV ANTIBODY (ROUTINE TESTING W REFLEX): HIV Screen 4th Generation wRfx: NONREACTIVE

## 2017-01-13 ENCOUNTER — Encounter (HOSPITAL_COMMUNITY): Payer: Self-pay

## 2017-01-13 ENCOUNTER — Ambulatory Visit (HOSPITAL_COMMUNITY)
Admission: RE | Admit: 2017-01-13 | Discharge: 2017-01-13 | Disposition: A | Discharge status: Home / Self Care | Payer: Medicaid Other | Source: Ambulatory Visit | Attending: Obstetrics & Gynecology | Admitting: Obstetrics & Gynecology

## 2017-01-13 DIAGNOSIS — O99343 Other mental disorders complicating pregnancy, third trimester: Secondary | ICD-10-CM | POA: Diagnosis not present

## 2017-01-13 DIAGNOSIS — F1920 Other psychoactive substance dependence, uncomplicated: Secondary | ICD-10-CM | POA: Diagnosis not present

## 2017-01-13 DIAGNOSIS — Z3A30 30 weeks gestation of pregnancy: Secondary | ICD-10-CM | POA: Insufficient documentation

## 2017-01-13 DIAGNOSIS — F419 Anxiety disorder, unspecified: Secondary | ICD-10-CM | POA: Insufficient documentation

## 2017-01-13 DIAGNOSIS — Z3689 Encounter for other specified antenatal screening: Secondary | ICD-10-CM

## 2017-01-13 DIAGNOSIS — F329 Major depressive disorder, single episode, unspecified: Secondary | ICD-10-CM | POA: Insufficient documentation

## 2017-01-13 DIAGNOSIS — O09893 Supervision of other high risk pregnancies, third trimester: Secondary | ICD-10-CM | POA: Diagnosis not present

## 2017-01-13 DIAGNOSIS — O99323 Drug use complicating pregnancy, third trimester: Secondary | ICD-10-CM | POA: Diagnosis not present

## 2017-01-13 DIAGNOSIS — O09899 Supervision of other high risk pregnancies, unspecified trimester: Secondary | ICD-10-CM

## 2017-01-14 ENCOUNTER — Telehealth: Payer: Self-pay

## 2017-01-14 ENCOUNTER — Other Ambulatory Visit (HOSPITAL_COMMUNITY): Payer: Self-pay | Admitting: *Deleted

## 2017-01-14 DIAGNOSIS — O99323 Drug use complicating pregnancy, third trimester: Principal | ICD-10-CM

## 2017-01-14 DIAGNOSIS — F1120 Opioid dependence, uncomplicated: Secondary | ICD-10-CM

## 2017-01-14 NOTE — Telephone Encounter (Signed)
Left message for patient to return call to office.  [name] RNBSN 

## 2017-01-14 NOTE — Telephone Encounter (Signed)
-----   Message from Levie Heritage, DO sent at 01/12/2017  5:16 PM EDT ----- Can you call the patient and confirm that she was truly fasting - if she was, then has GDM and needs DM education consult

## 2017-01-21 NOTE — Telephone Encounter (Signed)
error 

## 2017-01-22 ENCOUNTER — Encounter: Payer: Medicaid Other | Admitting: Family Medicine

## 2017-01-28 IMAGING — US US MFM OB FOLLOW-UP
1 series · 12 of 28 positions shown · non-contrast
Comparison: none

[Series 1: us mfm ob follow-up · 0.23mm/px · 12 of 49 slices shown]
[im 2/49]
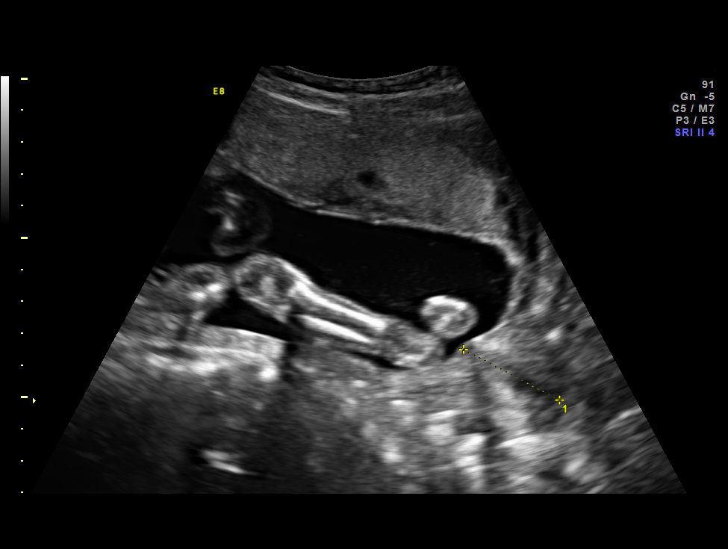
[im 6/49]
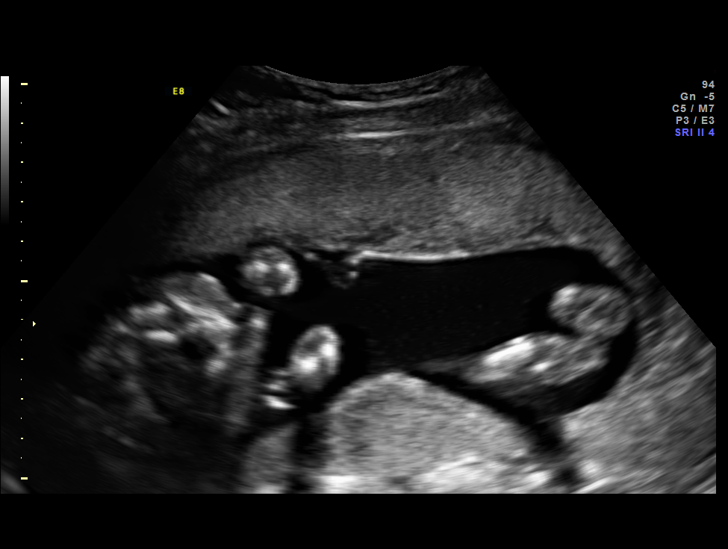
[im 9/49]
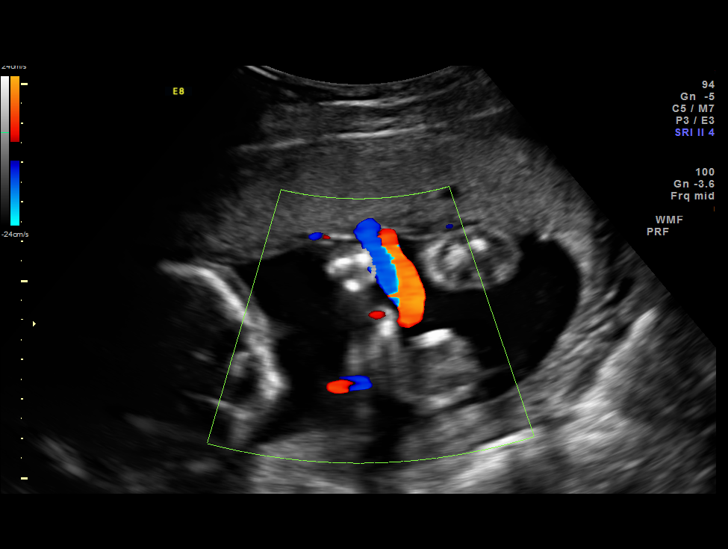
[im 15/49]
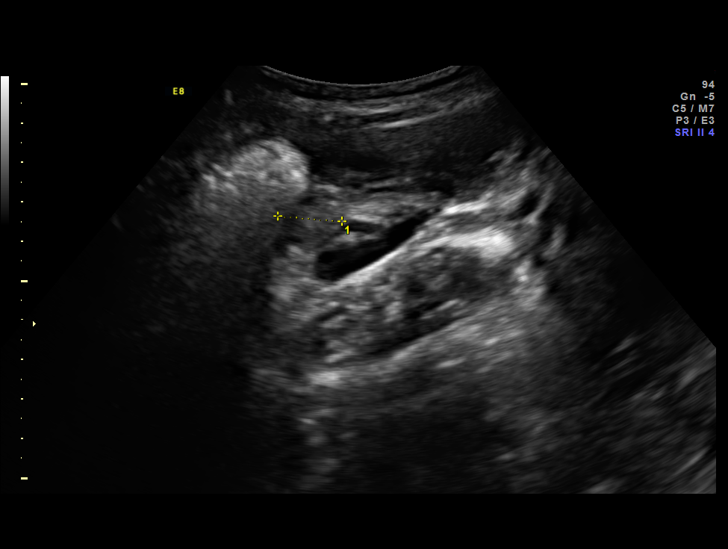
[im 18/49]
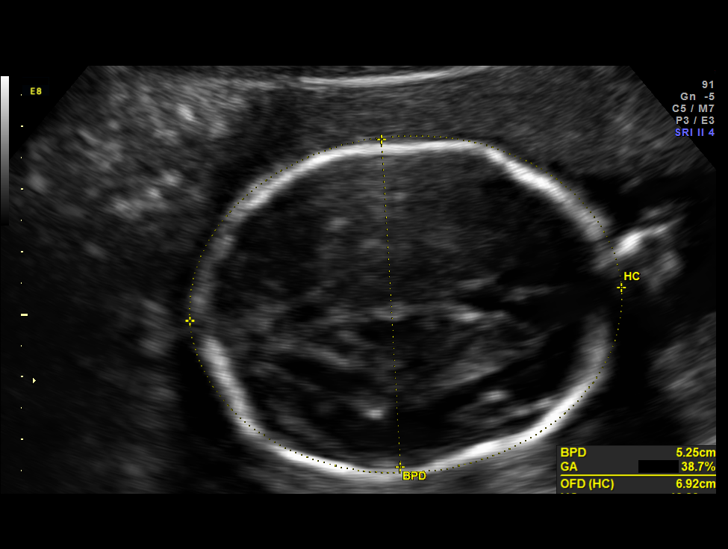
[im 22/49]
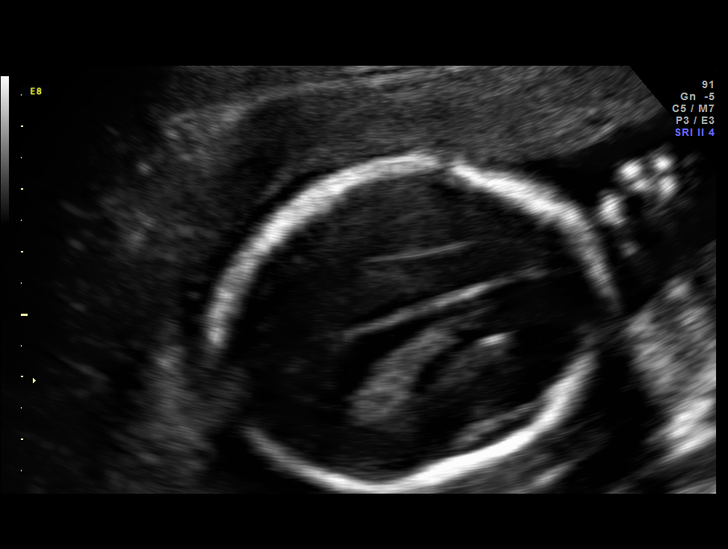
[im 27/49]
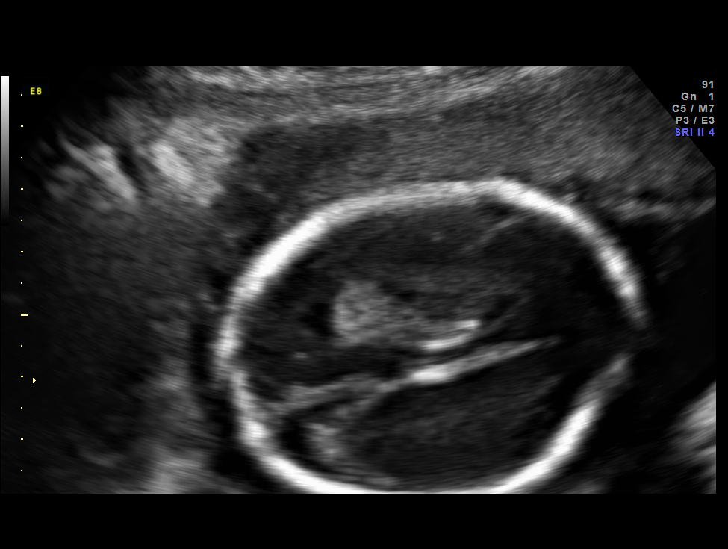
[im 31/49]
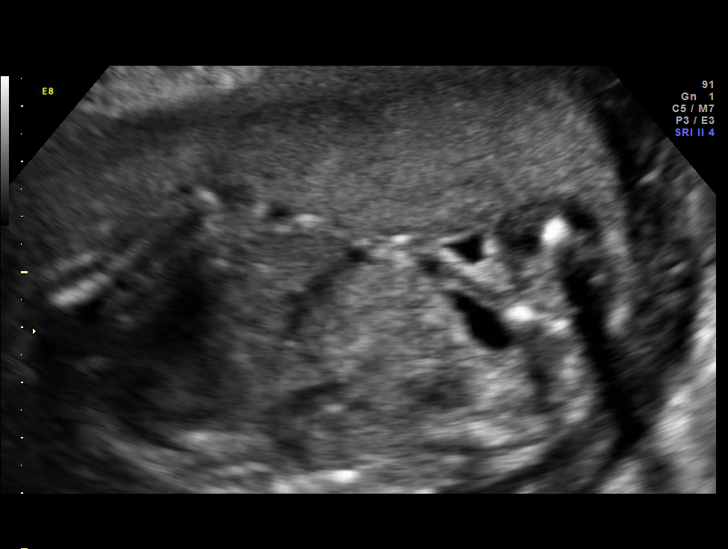
[im 34/49]
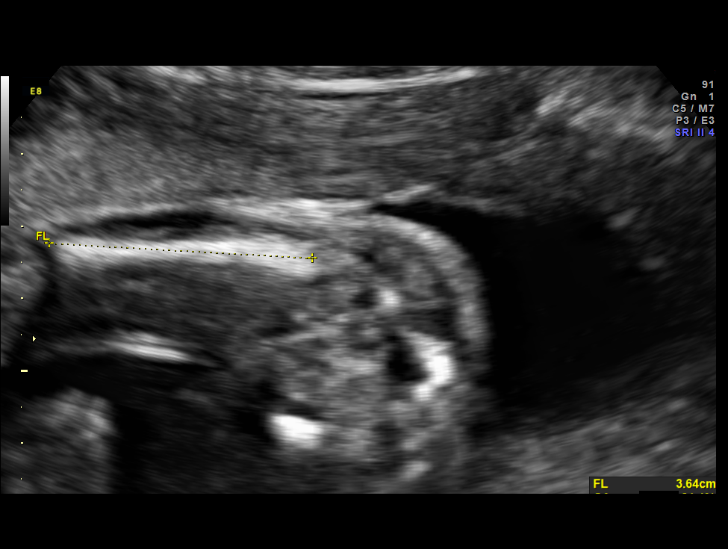
[im 40/49]
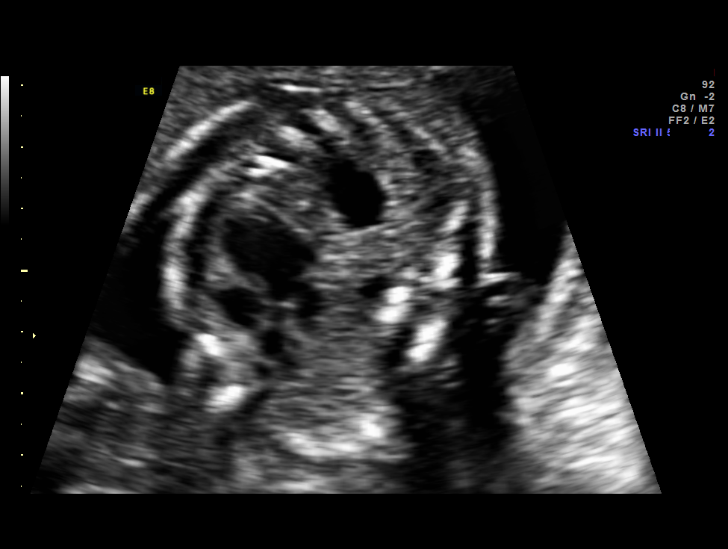
[im 43/49]
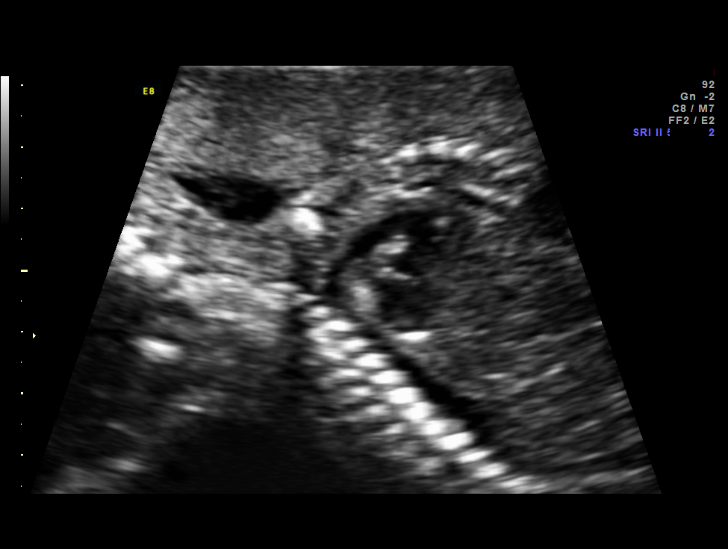
[im 47/49]
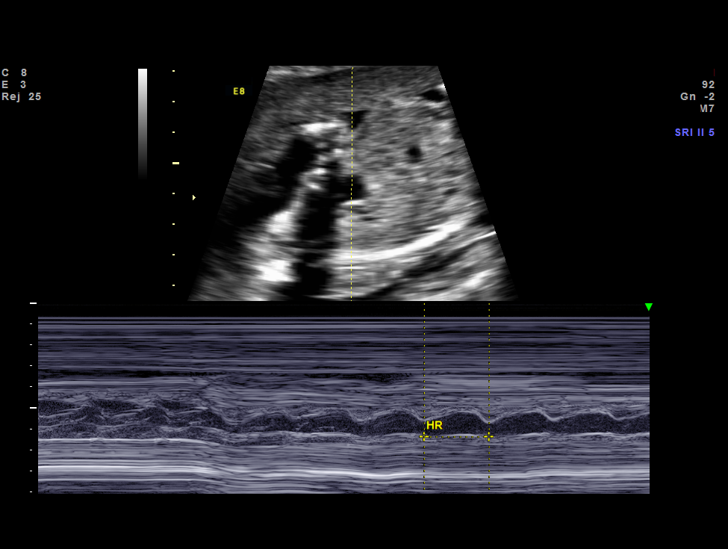

[12 of 28 positions shown; findings below may reference images not displayed]

OBSTETRICS REPORT
(Signed Final 01/23/2015 [DATE])

Name:       FEENSTRA GAAL                       Visit  01/23/2015 [DATE]
Date:

By:
Service(s) Provided

Indications

22 weeks gestation of pregnancy
Drug use complicating pregnancy, second
trimester, on Suboxone
Fetal Evaluation

Num Of             1
Fetuses:
Fetal Heart        155                          bpm
Rate:
Cardiac Activity:  Observed
Presentation:      Cephalic
Placenta:          Anterior, above cervical
os
P. Cord            Visualized
Insertion:

Amniotic Fluid
AFI FV:      Subjectively within normal limits
Larg Pckt:      3.7  cm
Biometry

BPD:     52.3   m    G. Age:   21w 6d                 CI:         75.0   70 - 86
m
OFD:     69.7   m                                     FL/HC:      18.3   18.4 -
m
HC:     196.2   m    G. Age:   21w 6d        25  %    HC/AC:      1.05   1.06 -
m
AC:     186.5   m    G. Age:   23w 3d        80  %    FL/BPD      68.6   71 - 87
m                                     :
FL:      35.9   m    G. Age:   21w 3d        18  %    FL/AC:      19.2   20 - 24
m
HUM:     35.7   m    G. Age:   22w 3d        53  %
m

Est.         503   gm    1 lb 2 oz      53   %
FW:
Gestational Age

LMP:           27w 0d        Date:  07/18/14                  EDD:   04/24/15
U/S Today:     22w 1d                                         EDD:   05/28/15
Best:          22w 1d    Det. By:   U/S C R L (11/21/14)      EDD:   05/28/15
Anatomy

Cranium:          Appears normal         Aortic Arch:       Appears normal
Fetal Cavum:      Appears normal         Ductal Arch:       Appears normal
Ventricles:       Appears normal         Diaphragm:         Appears normal
Choroid Plexus:   Appears normal         Stomach:           Appears normal,
left sided
Cerebellum:       Appears normal         Abdomen:           Appears normal
Posterior         Appears normal         Abdominal          Previously seen
Fossa:                                   Wall:
Nuchal Fold:      Previously seen        Cord Vessels:      Appears normal (3
vessel cord)
Face:             Orbits and profile     Kidneys:           Appear normal
previously seen
Lips:             Previously seen        Bladder:           Appears normal
Heart:            Previously seen        Spine:             Previously seen
RVOT:             Appears normal         Lower              Previously seen
Extremities:
LVOT:             Appears normal         Upper              Previously seen
Extremities:

Other:   Fetus appears to be a male. Heels and 5th digit prev. visualized.
Cervix Uterus Adnexa

Cervical Length:    3.4       cm

Cervix:       Normal appearance by transabdominal scan.

Left Ovary:    Not visualized. No adnexal mass visualized.
Right Ovary:   Within normal limits.
Impression

SIUP at 22+1 weeks
Normal interval anatomy; anatomic survey complete
Normal amniotic fluid volume
Appropriate interval growth with EFW at the 53rd %tile
Recommendations

Follow-up ultrasound for growth in 6-8 weeks

## 2017-01-28 NOTE — Telephone Encounter (Signed)
Attempted to reach patient again in regards to asking her if she was fasting on the day she did her two hour gtt.  Also since she missed her appointment on 01-22-17 needed to reschedule this appointment.   Her phone is not accepting calls at this time. Armandina StammerJennifer Euleta Belson RNBSN

## 2017-02-02 ENCOUNTER — Ambulatory Visit (INDEPENDENT_AMBULATORY_CARE_PROVIDER_SITE_OTHER): Payer: Medicaid Other | Admitting: Advanced Practice Midwife

## 2017-02-02 VITALS — BP 127/66 | HR 82 | Wt 134.0 lb

## 2017-02-02 DIAGNOSIS — O0990 Supervision of high risk pregnancy, unspecified, unspecified trimester: Secondary | ICD-10-CM

## 2017-02-02 DIAGNOSIS — Q270 Congenital absence and hypoplasia of umbilical artery: Secondary | ICD-10-CM

## 2017-02-02 DIAGNOSIS — O99320 Drug use complicating pregnancy, unspecified trimester: Secondary | ICD-10-CM

## 2017-02-02 DIAGNOSIS — F1120 Opioid dependence, uncomplicated: Secondary | ICD-10-CM

## 2017-02-02 NOTE — Progress Notes (Signed)
   PRENATAL VISIT NOTE  Subjective:  Dina RichJulie A Fleer is a 31 y.o. G3P1011 at 6276w4d being seen today for ongoing prenatal care.  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  Patient reports no complaints.  Contractions: Not present. Vag. Bleeding: None.  Movement: Present. Denies leaking of fluid.   The following portions of the patient's history were reviewed and updated as appropriate: allergies, current medications, past family history, past medical history, past social history, past surgical history and problem list. Problem list updated.  Objective:   Vitals:   02/02/17 1024  BP: 127/66  Pulse: 82  Weight: 134 lb (60.8 kg)    Fetal Status:     Movement: Present     General:  Alert, oriented and cooperative. Patient is in no acute distress.  Skin: Skin is warm and dry. No rash noted.   Cardiovascular: Normal heart rate noted  Respiratory: Normal respiratory effort, no problems with respiration noted  Abdomen: Soft, gravid, appropriate for gestational age.  Pain/Pressure: Present     Pelvic: Cervical exam deferred        Extremities: Normal range of motion.  Edema: None  Mental Status:  Normal mood and affect. Normal behavior. Normal judgment and thought content.   Assessment and Plan:  Pregnancy: G3P1011 at 4276w4d  1. Single umbilical artery     Needs to start NST's this week.  Missed last visit.  Last growth US 71%ile.  Has one scheduled for November 7.  Discussed need to test NSTs and continue growth US       Admits to drinking soda prior to Glucola   Will repeat test  this week with NST  Cannot stay today  Preterm labor symptoms and general obstetric precautions including but not limited to vaginal bleeding, contractions, leaking of fluid and fetal movement were reviewed in detail with the patient. Please refer to After Visit Summary for other counseling recommendations.  RTO Friday  Wynelle BourgeoisMarie Williams, CNM

## 2017-02-02 NOTE — Patient Instructions (Signed)

## 2017-02-05 ENCOUNTER — Other Ambulatory Visit: Payer: Medicaid Other

## 2017-02-09 ENCOUNTER — Ambulatory Visit (INDEPENDENT_AMBULATORY_CARE_PROVIDER_SITE_OTHER): Payer: Medicaid Other | Admitting: Advanced Practice Midwife

## 2017-02-09 VITALS — BP 124/67 | HR 80 | Wt 135.0 lb

## 2017-02-09 DIAGNOSIS — Z23 Encounter for immunization: Secondary | ICD-10-CM | POA: Diagnosis not present

## 2017-02-09 DIAGNOSIS — O0990 Supervision of high risk pregnancy, unspecified, unspecified trimester: Secondary | ICD-10-CM

## 2017-02-09 DIAGNOSIS — O99323 Drug use complicating pregnancy, third trimester: Secondary | ICD-10-CM

## 2017-02-09 DIAGNOSIS — F1120 Opioid dependence, uncomplicated: Secondary | ICD-10-CM

## 2017-02-09 DIAGNOSIS — O0993 Supervision of high risk pregnancy, unspecified, third trimester: Secondary | ICD-10-CM | POA: Diagnosis not present

## 2017-02-09 DIAGNOSIS — Q270 Congenital absence and hypoplasia of umbilical artery: Secondary | ICD-10-CM

## 2017-02-09 DIAGNOSIS — O99320 Drug use complicating pregnancy, unspecified trimester: Secondary | ICD-10-CM

## 2017-02-09 NOTE — Progress Notes (Signed)
   PRENATAL VISIT NOTE  Subjective:  Olivia Faulkner is a 31 y.o. G3P1011 at 6772w4d being seen today for ongoing prenatal care.  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  Patient reports no complaints.  Contractions: Irritability. Vag. Bleeding: None.  Movement: Present. Denies leaking of fluid.   The following portions of the patient's history were reviewed and updated as appropriate: allergies, current medications, past family history, past medical history, past social history, past surgical history and problem list. Problem list updated.  Objective:   Vitals:   02/09/17 1131  BP: 124/67  Pulse: 80  Weight: 135 lb (61.2 kg)    Fetal Status: Fetal Heart Rate (bpm): NST-R Fundal Height: 33 cm Movement: Present  Presentation: Vertex  NST reactive after prolonged monitoring (while awaiting blood draw for GTT_ 140, min-mod variability, 15x15 accels no decels General:  Alert, oriented and cooperative. Patient is in no acute distress.  Skin: Skin is warm and dry. No rash noted.   Cardiovascular: Normal heart rate noted  Respiratory: Normal respiratory effort, no problems with respiration noted  Abdomen: Soft, gravid, appropriate for gestational age.  Pain/Pressure: Present     Pelvic: Cervical exam deferred        Extremities: Normal range of motion.  Edema: None  Mental Status:  Normal mood and affect. Normal behavior. Normal judgment and thought content.   Assessment and Plan:  Pregnancy: G3P1011 at 2072w4d  1. High risk pregnancy, antepartum  - Glucose Tolerance, 2 Hours w/1  Hour -. TDaP  2. Single umbilical artery  - Growth US tomorrow - Antenatal testing  3. 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 Please refer to After Visit Summary for other counseling recommendations.  ROB in 2 weeks. Continue antenatal testing   Dorathy KinsmanVirginia Ulrich Soules, CNM

## 2017-02-09 NOTE — Patient Instructions (Signed)

## 2017-02-09 NOTE — Progress Notes (Signed)
Patient repeating her 2 hr gtt. Olivia StammerJennifer Howard RNBSN

## 2017-02-10 ENCOUNTER — Ambulatory Visit (HOSPITAL_COMMUNITY)
Admission: RE | Admit: 2017-02-10 | Discharge: 2017-02-10 | Disposition: A | Discharge status: Home / Self Care | Payer: Medicaid Other | Source: Ambulatory Visit | Attending: Obstetrics & Gynecology | Admitting: Obstetrics & Gynecology

## 2017-02-10 ENCOUNTER — Encounter (HOSPITAL_COMMUNITY): Payer: Self-pay

## 2017-02-10 DIAGNOSIS — F419 Anxiety disorder, unspecified: Secondary | ICD-10-CM | POA: Diagnosis not present

## 2017-02-10 DIAGNOSIS — O99343 Other mental disorders complicating pregnancy, third trimester: Secondary | ICD-10-CM | POA: Diagnosis not present

## 2017-02-10 DIAGNOSIS — Q270 Congenital absence and hypoplasia of umbilical artery: Secondary | ICD-10-CM

## 2017-02-10 DIAGNOSIS — F1120 Opioid dependence, uncomplicated: Secondary | ICD-10-CM | POA: Diagnosis present

## 2017-02-10 DIAGNOSIS — O09893 Supervision of other high risk pregnancies, third trimester: Secondary | ICD-10-CM | POA: Insufficient documentation

## 2017-02-10 DIAGNOSIS — O99323 Drug use complicating pregnancy, third trimester: Secondary | ICD-10-CM | POA: Diagnosis present

## 2017-02-10 DIAGNOSIS — Z362 Encounter for other antenatal screening follow-up: Secondary | ICD-10-CM | POA: Insufficient documentation

## 2017-02-10 DIAGNOSIS — Z3A34 34 weeks gestation of pregnancy: Secondary | ICD-10-CM | POA: Insufficient documentation

## 2017-02-11 LAB — GLUCOSE TOLERANCE, 2 HOURS W/ 1HR
GLUCOSE, 2 HOUR: 79 mg/dL (ref 65–152)
Glucose, 1 hour: 56 mg/dL — ABNORMAL LOW (ref 65–179)
Glucose, Fasting: 70 mg/dL (ref 65–91)

## 2017-02-12 ENCOUNTER — Other Ambulatory Visit: Payer: Medicaid Other

## 2017-02-12 ENCOUNTER — Encounter: Payer: Self-pay | Admitting: Family Medicine

## 2017-02-12 ENCOUNTER — Ambulatory Visit (INDEPENDENT_AMBULATORY_CARE_PROVIDER_SITE_OTHER): Payer: Medicaid Other

## 2017-02-12 VITALS — BP 122/68 | HR 82

## 2017-02-12 DIAGNOSIS — O99320 Drug use complicating pregnancy, unspecified trimester: Secondary | ICD-10-CM

## 2017-02-12 DIAGNOSIS — Q270 Congenital absence and hypoplasia of umbilical artery: Secondary | ICD-10-CM

## 2017-02-12 DIAGNOSIS — O219 Vomiting of pregnancy, unspecified: Secondary | ICD-10-CM

## 2017-02-12 DIAGNOSIS — F1120 Opioid dependence, uncomplicated: Secondary | ICD-10-CM

## 2017-02-12 DIAGNOSIS — O0990 Supervision of high risk pregnancy, unspecified, unspecified trimester: Secondary | ICD-10-CM

## 2017-02-12 DIAGNOSIS — O0993 Supervision of high risk pregnancy, unspecified, third trimester: Secondary | ICD-10-CM

## 2017-02-12 MED ORDER — DOXYLAMINE-PYRIDOXINE 10-10 MG PO TBEC: 1.0000 | DELAYED_RELEASE_TABLET | Freq: Every day | ORAL | 3 refills | Status: DC

## 2017-02-12 NOTE — Progress Notes (Signed)
Patient for NST only visit.  Natalie Mceuen RN BSN 

## 2017-02-16 ENCOUNTER — Other Ambulatory Visit (HOSPITAL_COMMUNITY)
Admission: RE | Admit: 2017-02-16 | Discharge: 2017-02-16 | Disposition: A | Discharge status: Home / Self Care | Payer: Medicaid Other | Source: Ambulatory Visit | Attending: Advanced Practice Midwife | Admitting: Advanced Practice Midwife

## 2017-02-16 ENCOUNTER — Ambulatory Visit (INDEPENDENT_AMBULATORY_CARE_PROVIDER_SITE_OTHER): Payer: Medicaid Other | Admitting: Advanced Practice Midwife

## 2017-02-16 ENCOUNTER — Encounter: Payer: Self-pay | Admitting: Advanced Practice Midwife

## 2017-02-16 VITALS — BP 112/69 | HR 75 | Wt 135.0 lb

## 2017-02-16 DIAGNOSIS — Z3493 Encounter for supervision of normal pregnancy, unspecified, third trimester: Secondary | ICD-10-CM | POA: Insufficient documentation

## 2017-02-16 DIAGNOSIS — Z3A34 34 weeks gestation of pregnancy: Secondary | ICD-10-CM | POA: Diagnosis not present

## 2017-02-16 DIAGNOSIS — Z3483 Encounter for supervision of other normal pregnancy, third trimester: Secondary | ICD-10-CM

## 2017-02-16 DIAGNOSIS — Q270 Congenital absence and hypoplasia of umbilical artery: Secondary | ICD-10-CM

## 2017-02-16 DIAGNOSIS — Z3490 Encounter for supervision of normal pregnancy, unspecified, unspecified trimester: Secondary | ICD-10-CM

## 2017-02-16 LAB — OB RESULTS CONSOLE GBS: GBS: NEGATIVE

## 2017-02-16 NOTE — Patient Instructions (Signed)

## 2017-02-16 NOTE — Progress Notes (Addendum)
Subjective:    Olivia RichJulie A Faulkner is a 31 y.o. female being seen today for her obstetrical visit. She is at 2250w4d gestation. Patient reports no complaints. Fetal movement: normal.  Review of Systems:   Review of Systems  Constitutional: Negative for chills and fever.  Gastrointestinal: Negative for abdominal pain.  Genitourinary: Negative for dysuria, vaginal bleeding and vaginal discharge.     Objective:    BP 112/69   Pulse 75   Wt 135 lb (61.2 kg)   LMP 06/01/2016 (Exact Date)   BMI 23.91 kg/m   Physical Exam  Constitutional: She is oriented to person, place, and time. She appears well-developed and well-nourished. No distress.  Cardiovascular: Normal rate.  Respiratory: Effort normal. No respiratory distress.  GI: Soft. There is no tenderness.  Musculoskeletal: Normal range of motion.  Neurological: She is alert and oriented to person, place, and time.  Skin: Skin is warm and dry.  Psychiatric: She has a normal mood and affect.    Maternal Exam:  Abdomen: Patient reports no abdominal tenderness. Introitus: Ferning test: not done.       FHT:  135 BPM  Uterine Size: size equals dates  Presentation: cephalic     Assessment:    Pregnancy:  G3P1011    Plan:    Patient Active Problem List   Diagnosis Date Noted  . Opioid use disorder, severe, in early remission (HCC) 10/30/2016  . Single umbilical artery 10/21/2016  . Pregnancy 05/30/2015  . 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 Anemia in pregnancy 03/06/2015  . 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 High risk pregnancy, antepartum 11/08/2014  . Drug use affecting pregnancy 11/08/2014  . Eczema 06/06/2014  . Anxiety and depression 04/16/2011    Reviewed signs to report.  GBS and cultures done. Reviewed signs of labor Follow up in 1 Week.

## 2017-02-17 LAB — GC/CHLAMYDIA PROBE AMP (~~LOC~~) NOT AT ARMC
Chlamydia: NEGATIVE
NEISSERIA GONORRHEA: NEGATIVE

## 2017-02-18 ENCOUNTER — Telehealth (HOSPITAL_COMMUNITY): Payer: Self-pay

## 2017-02-18 NOTE — Telephone Encounter (Signed)
Attempting to reach patient to schedule a NICU NAS consult. Unable to leave a message at phone number provided. Please call [phone] to schedule tour.

## 2017-02-19 ENCOUNTER — Other Ambulatory Visit: Payer: Self-pay | Admitting: Family Medicine

## 2017-02-19 ENCOUNTER — Ambulatory Visit (HOSPITAL_COMMUNITY): Payer: Medicaid Other

## 2017-02-19 ENCOUNTER — Other Ambulatory Visit: Payer: Medicaid Other

## 2017-02-19 ENCOUNTER — Ambulatory Visit (HOSPITAL_COMMUNITY)
Admission: RE | Admit: 2017-02-19 | Discharge: 2017-02-19 | Disposition: A | Discharge status: Home / Self Care | Payer: Medicaid Other | Source: Ambulatory Visit | Attending: Family Medicine | Admitting: Family Medicine

## 2017-02-19 DIAGNOSIS — O99343 Other mental disorders complicating pregnancy, third trimester: Secondary | ICD-10-CM

## 2017-02-19 DIAGNOSIS — F329 Major depressive disorder, single episode, unspecified: Secondary | ICD-10-CM

## 2017-02-19 DIAGNOSIS — Z3A36 36 weeks gestation of pregnancy: Secondary | ICD-10-CM

## 2017-02-19 DIAGNOSIS — Q270 Congenital absence and hypoplasia of umbilical artery: Secondary | ICD-10-CM

## 2017-02-19 DIAGNOSIS — F419 Anxiety disorder, unspecified: Secondary | ICD-10-CM

## 2017-02-19 DIAGNOSIS — F32A Depression, unspecified: Secondary | ICD-10-CM

## 2017-02-19 DIAGNOSIS — F1120 Opioid dependence, uncomplicated: Secondary | ICD-10-CM

## 2017-02-19 DIAGNOSIS — O0990 Supervision of high risk pregnancy, unspecified, unspecified trimester: Secondary | ICD-10-CM

## 2017-02-19 DIAGNOSIS — O99323 Drug use complicating pregnancy, third trimester: Secondary | ICD-10-CM

## 2017-02-19 DIAGNOSIS — Z362 Encounter for other antenatal screening follow-up: Secondary | ICD-10-CM | POA: Insufficient documentation

## 2017-02-19 DIAGNOSIS — O0993 Supervision of high risk pregnancy, unspecified, third trimester: Secondary | ICD-10-CM | POA: Diagnosis present

## 2017-02-19 DIAGNOSIS — F1920 Other psychoactive substance dependence, uncomplicated: Secondary | ICD-10-CM

## 2017-02-20 LAB — CULTURE, BETA STREP (GROUP B ONLY): STREP GP B CULTURE: NEGATIVE

## 2017-02-22 ENCOUNTER — Other Ambulatory Visit (HOSPITAL_COMMUNITY): Payer: Self-pay | Admitting: *Deleted

## 2017-02-22 DIAGNOSIS — O409XX0 Polyhydramnios, unspecified trimester, not applicable or unspecified: Secondary | ICD-10-CM

## 2017-02-23 ENCOUNTER — Other Ambulatory Visit: Payer: Medicaid Other

## 2017-02-24 ENCOUNTER — Encounter (HOSPITAL_COMMUNITY): Payer: Self-pay

## 2017-02-24 ENCOUNTER — Ambulatory Visit (HOSPITAL_COMMUNITY)
Admission: RE | Admit: 2017-02-24 | Discharge: 2017-02-24 | Disposition: A | Discharge status: Home / Self Care | Payer: Medicaid Other | Source: Ambulatory Visit | Attending: Family Medicine | Admitting: Family Medicine

## 2017-02-24 DIAGNOSIS — O99343 Other mental disorders complicating pregnancy, third trimester: Secondary | ICD-10-CM | POA: Insufficient documentation

## 2017-02-24 DIAGNOSIS — O99323 Drug use complicating pregnancy, third trimester: Secondary | ICD-10-CM | POA: Diagnosis present

## 2017-02-24 DIAGNOSIS — Z362 Encounter for other antenatal screening follow-up: Secondary | ICD-10-CM | POA: Insufficient documentation

## 2017-02-24 DIAGNOSIS — F1920 Other psychoactive substance dependence, uncomplicated: Secondary | ICD-10-CM | POA: Diagnosis not present

## 2017-02-24 DIAGNOSIS — F1120 Opioid dependence, uncomplicated: Secondary | ICD-10-CM

## 2017-02-24 DIAGNOSIS — O99320 Drug use complicating pregnancy, unspecified trimester: Principal | ICD-10-CM

## 2017-02-24 DIAGNOSIS — Z3A36 36 weeks gestation of pregnancy: Secondary | ICD-10-CM | POA: Insufficient documentation

## 2017-02-24 DIAGNOSIS — O409XX0 Polyhydramnios, unspecified trimester, not applicable or unspecified: Secondary | ICD-10-CM

## 2017-02-24 NOTE — Procedures (Signed)
Olivia DeedJulie A Faulkner Jan 25, 1986 8584w5d  Fetus A Non-Stress Test Interpretation for 02/24/17  Indication: BPP 6/8, no breathing movements  Fetal Heart Rate A Mode: External Baseline Rate (A): 120 bpm Variability: Moderate Accelerations: 15 x 15 Decelerations: None Multiple birth?: No  Uterine Activity Mode: Toco Contraction Frequency (min): Irreg. w/UI Contraction Duration (sec): 10-40 Contraction Quality: Mild Resting Tone Palpated: Relaxed Resting Time: Adequate  Interpretation (Fetal Testing) Nonstress Test Interpretation: Reactive Comments: Reviewed by Dr. Sherrie Georgeecker

## 2017-02-28 ENCOUNTER — Encounter: Payer: Self-pay | Admitting: Family Medicine

## 2017-03-01 ENCOUNTER — Encounter (HOSPITAL_COMMUNITY): Payer: Self-pay

## 2017-03-01 ENCOUNTER — Inpatient Hospital Stay (HOSPITAL_COMMUNITY)
Admission: AD | Admit: 2017-03-01 | Discharge: 2017-03-02 | Disposition: A | Discharge status: Home / Self Care | Payer: Medicaid Other | Source: Ambulatory Visit | Attending: Obstetrics & Gynecology | Admitting: Obstetrics & Gynecology

## 2017-03-01 DIAGNOSIS — Z3A37 37 weeks gestation of pregnancy: Secondary | ICD-10-CM | POA: Diagnosis not present

## 2017-03-01 DIAGNOSIS — O26893 Other specified pregnancy related conditions, third trimester: Secondary | ICD-10-CM | POA: Diagnosis not present

## 2017-03-01 DIAGNOSIS — Q270 Congenital absence and hypoplasia of umbilical artery: Secondary | ICD-10-CM

## 2017-03-01 DIAGNOSIS — O99343 Other mental disorders complicating pregnancy, third trimester: Secondary | ICD-10-CM | POA: Diagnosis not present

## 2017-03-01 DIAGNOSIS — Z87891 Personal history of nicotine dependence: Secondary | ICD-10-CM | POA: Insufficient documentation

## 2017-03-01 DIAGNOSIS — O26899 Other specified pregnancy related conditions, unspecified trimester: Secondary | ICD-10-CM

## 2017-03-01 DIAGNOSIS — R109 Unspecified abdominal pain: Secondary | ICD-10-CM | POA: Insufficient documentation

## 2017-03-01 DIAGNOSIS — F419 Anxiety disorder, unspecified: Secondary | ICD-10-CM | POA: Diagnosis not present

## 2017-03-01 DIAGNOSIS — O288 Other abnormal findings on antenatal screening of mother: Secondary | ICD-10-CM

## 2017-03-01 DIAGNOSIS — O99513 Diseases of the respiratory system complicating pregnancy, third trimester: Secondary | ICD-10-CM | POA: Insufficient documentation

## 2017-03-01 DIAGNOSIS — O99323 Drug use complicating pregnancy, third trimester: Secondary | ICD-10-CM

## 2017-03-01 DIAGNOSIS — F329 Major depressive disorder, single episode, unspecified: Secondary | ICD-10-CM | POA: Insufficient documentation

## 2017-03-01 DIAGNOSIS — J45909 Unspecified asthma, uncomplicated: Secondary | ICD-10-CM | POA: Insufficient documentation

## 2017-03-01 NOTE — MAU Note (Signed)
Pt here with c/o right sided abdominal pains earlier; not really hurting now. Reports good fetal movement. Denies any bleeding or leaking.

## 2017-03-02 ENCOUNTER — Inpatient Hospital Stay (HOSPITAL_COMMUNITY): Payer: Medicaid Other

## 2017-03-02 ENCOUNTER — Encounter: Payer: Medicaid Other | Admitting: Advanced Practice Midwife

## 2017-03-02 ENCOUNTER — Inpatient Hospital Stay (HOSPITAL_COMMUNITY)
Admission: AD | Admit: 2017-03-02 | Discharge: 2017-03-02 | Disposition: A | Discharge status: Home / Self Care | Payer: Medicaid Other | Source: Ambulatory Visit | Attending: Obstetrics & Gynecology | Admitting: Obstetrics & Gynecology

## 2017-03-02 ENCOUNTER — Encounter (HOSPITAL_COMMUNITY): Payer: Self-pay

## 2017-03-02 ENCOUNTER — Other Ambulatory Visit: Payer: Self-pay

## 2017-03-02 DIAGNOSIS — O26893 Other specified pregnancy related conditions, third trimester: Secondary | ICD-10-CM

## 2017-03-02 DIAGNOSIS — Q270 Congenital absence and hypoplasia of umbilical artery: Secondary | ICD-10-CM

## 2017-03-02 DIAGNOSIS — O288 Other abnormal findings on antenatal screening of mother: Secondary | ICD-10-CM

## 2017-03-02 DIAGNOSIS — R109 Unspecified abdominal pain: Secondary | ICD-10-CM | POA: Diagnosis not present

## 2017-03-02 DIAGNOSIS — O471 False labor at or after 37 completed weeks of gestation: Secondary | ICD-10-CM

## 2017-03-02 DIAGNOSIS — Z3A37 37 weeks gestation of pregnancy: Secondary | ICD-10-CM

## 2017-03-02 LAB — URINALYSIS, ROUTINE W REFLEX MICROSCOPIC
BILIRUBIN URINE: NEGATIVE
Glucose, UA: NEGATIVE mg/dL
Hgb urine dipstick: NEGATIVE
KETONES UR: NEGATIVE mg/dL
Nitrite: NEGATIVE
PH: 7 (ref 5.0–8.0)
Protein, ur: NEGATIVE mg/dL
SPECIFIC GRAVITY, URINE: 1.011 (ref 1.005–1.030)

## 2017-03-02 NOTE — MAU Note (Signed)
Was here last night for contractions.  Was 2 cm.  Noted some blood last night.  Office said to come in.  Now having brownish mucous. Still contracting

## 2017-03-02 NOTE — Progress Notes (Signed)
D/c instructions reviewed, given, & signed for.  Pt educated on vag bleeding & braxton hicks ctx.  Pt verb understanding & d/c home with mother in stable condition.

## 2017-03-02 NOTE — MAU Provider Note (Signed)
History     CSN: [id number]  Arrival date and time: 03/01/17 2316  Chief Complaint  Patient presents with  . Abdominal Pain   G3P1011 @37 .4 wks here with abdominal pain. Pain started about 1 hr before she arrived. Describes as tight feeling on the right side of her abdomen. Pain subsided after 45 minutes. She did not take anything for it. No urinary sx. Denies VB, LOF, and ctx. Reports good FM.     OB History    Gravida Para Term Preterm AB Living   3 1 1   1 1    SAB TAB Ectopic Multiple Live Births   1     0 1      Past Medical History:  Diagnosis Date  . Allergic rhinitis   . Anxiety   . Asthma    allergic  . Depression   . Eczema   . Former smoker   . History of chicken pox   . Marijuana use   . Migraine   . MRSA (methicillin resistant staph aureus) culture positive   . Pregnancy induced hypertension   . Suboxone maintenance treatment complicating pregnancy, antepartum (HCC)   . Vaginal Pap smear, abnormal    LGSIL    Past Surgical History:  Procedure Laterality Date  . NO PAST SURGERIES  04/16/2011    Family History  Problem Relation Age of Onset  . Alcohol abuse Maternal Grandfather   . Lung cancer Mother        Living  . Hypertension Mother   . COPD Mother   . Cancer Mother   . Heart disease Maternal Grandmother   . Kidney disease Maternal Grandmother   . Hypertension Maternal Grandmother   . Lung cancer Maternal Grandmother   . Diabetes Father        Living  . Hypertension Father   . Skin cancer Father   . Heart disease Paternal Grandmother   . Heart disease Paternal Grandfather   . Alcohol abuse Maternal Aunt   . COPD Maternal Aunt   . Alcohol abuse Brother   . Asthma Brother     Social History   Tobacco Use  . Smoking status: Former Smoker    Packs/day: 0.50    Types: Cigarettes    Last attempt to quit: 10/04/2014    Years since quitting: 2.4  . Smokeless tobacco: Never Used  Substance Use Topics  . Alcohol use: No  . Drug use:  Yes    Comment: suboxone    Allergies:  Allergies  Allergen Reactions  . Sulfa Antibiotics Hives and Itching    No medications prior to admission.    Review of Systems  Gastrointestinal: Positive for abdominal pain.  Genitourinary: Negative for dysuria, frequency, urgency, vaginal bleeding and vaginal discharge.   Physical Exam   Blood pressure 119/64, pulse 83, temperature 98.7 F (37.1 C), temperature source Oral, resp. rate 17, height 5\' 3"  (1.6 m), weight 138 lb (62.6 kg), last menstrual period 06/01/2016, SpO2 100 %, unknown if currently breastfeeding.  Physical Exam  Constitutional: She is oriented to person, place, and time. She appears well-developed and well-nourished. No distress.  Neck: Normal range of motion.  Cardiovascular: Normal rate.  Respiratory: Effort normal. No respiratory distress.  GI: She exhibits no distension. There is no tenderness.  gravid  Genitourinary:  Genitourinary Comments: sve 2/60, vtx, ballotable  Musculoskeletal: Normal range of motion.  Neurological: She is alert and oriented to person, place, and time.  Skin: Skin is warm and dry.  Psychiatric: She has a normal mood and affect.  EFM: 120 bpm, mod variability, no accels, no decels Toco: 3-5  Results for orders placed or performed during the hospital encounter of 03/01/17 (from the past 24 hour(s))  Urinalysis, Routine w reflex microscopic     Status: Abnormal   Collection Time: 03/01/17 11:30 PM  Result Value Ref Range   Color, Urine YELLOW YELLOW   APPearance HAZY (A) CLEAR   Specific Gravity, Urine 1.011 1.005 - 1.030   pH 7.0 5.0 - 8.0   Glucose, UA NEGATIVE NEGATIVE mg/dL   Hgb urine dipstick NEGATIVE NEGATIVE   Bilirubin Urine NEGATIVE NEGATIVE   Ketones, ur NEGATIVE NEGATIVE mg/dL   Protein, ur NEGATIVE NEGATIVE mg/dL   Nitrite NEGATIVE NEGATIVE   Leukocytes, UA MODERATE (A) NEGATIVE   RBC / HPF 0-5 0 - 5 RBC/hpf   WBC, UA 0-5 0 - 5 WBC/hpf   Bacteria, UA RARE (A)  NONE SEEN   Squamous Epithelial / LPF 0-5 (A) NONE SEEN   Mucus PRESENT     MAU Course  Procedures  MDM Labs ordered and reviewed. NST nonreactive. BPP 8/8, AFI 21cm. No evidence of labor or UTI. Stable for discharge home.  Assessment and Plan  [redacted] weeks gestation NST nonreactive Abdominal pain in pregnancy Discharge home Follow up in OB office later today as scheduled Labor precautions  Allergies as of 03/02/2017      Reactions   Sulfa Antibiotics Hives, Itching      Medication List    TAKE these medications   Doxylamine-Pyridoxine 10-10 MG Tbec Take 1 tablet at bedtime by mouth.   ondansetron 4 MG disintegrating tablet Commonly known as:  ZOFRAN ODT Take 1 tablet (4 mg total) by mouth every 6 (six) hours as needed for nausea.   PRENATAL VITAMINS PLUS 27-1 MG Tabs Take 1 tablet by mouth daily.   PROVENTIL HFA 108 (90 Base) MCG/ACT inhaler Generic drug:  albuterol INHALE ONE TO TWO PUFFS INTO LUNGS EVERY 6 HOURS AS NEEDED FOR WHEEZING FOR SHORTNESS OF BREATH   ranitidine 150 MG tablet Commonly known as:  ZANTAC Take 150 mg by mouth 2 (two) times daily.   SUBOXONE 4-1 MG Film Generic drug:  Buprenorphine HCl-Naloxone HCl Place 8 mg under the tongue daily.   triamcinolone cream 0.5 % Commonly known as:  KENALOG Apply 1 application topically 3 (three) times daily.      Donette LarryMelanie Lilburn Straw, CNM 03/02/2017, 2:04 AM

## 2017-03-02 NOTE — Discharge Instructions (Signed)

## 2017-03-03 ENCOUNTER — Encounter: Payer: Self-pay | Admitting: Family Medicine

## 2017-03-04 ENCOUNTER — Telehealth (HOSPITAL_COMMUNITY): Payer: Self-pay | Admitting: *Deleted

## 2017-03-04 NOTE — Telephone Encounter (Signed)
Preadmission screen  

## 2017-03-05 ENCOUNTER — Ambulatory Visit (HOSPITAL_COMMUNITY)
Admission: RE | Admit: 2017-03-05 | Discharge: 2017-03-05 | Disposition: A | Discharge status: Home / Self Care | Payer: Medicaid Other | Source: Ambulatory Visit | Attending: Certified Nurse Midwife | Admitting: Certified Nurse Midwife

## 2017-03-05 ENCOUNTER — Encounter (HOSPITAL_COMMUNITY): Payer: Self-pay

## 2017-03-05 DIAGNOSIS — Z3A38 38 weeks gestation of pregnancy: Secondary | ICD-10-CM | POA: Diagnosis not present

## 2017-03-05 DIAGNOSIS — O409XX0 Polyhydramnios, unspecified trimester, not applicable or unspecified: Secondary | ICD-10-CM | POA: Diagnosis not present

## 2017-03-05 DIAGNOSIS — O99320 Drug use complicating pregnancy, unspecified trimester: Principal | ICD-10-CM

## 2017-03-05 DIAGNOSIS — Q270 Congenital absence and hypoplasia of umbilical artery: Secondary | ICD-10-CM

## 2017-03-05 DIAGNOSIS — F1120 Opioid dependence, uncomplicated: Secondary | ICD-10-CM

## 2017-03-05 NOTE — Procedures (Signed)
Olivia Faulkner November 07, 1985 3336w0d  Fetus A Non-Stress Test Interpretation for 03/05/17  Indication: Unsatisfactory BPP  Fetal Heart Rate A Mode: External Baseline Rate (A): 120 bpm Variability: Moderate Accelerations: 15 x 15 Decelerations: Variable Multiple birth?: No  Uterine Activity Mode: Palpation, Toco Contraction Frequency (min): 2-3 Contraction Duration (sec): 40-80 Contraction Quality: Mild Resting Tone Palpated: Relaxed Resting Time: Adequate  Interpretation (Fetal Testing) Nonstress Test Interpretation: Reactive Comments: Reviewed tracing with Dr. Sherrie Georgeecker

## 2017-03-09 ENCOUNTER — Other Ambulatory Visit: Payer: Self-pay | Admitting: Certified Nurse Midwife

## 2017-03-09 ENCOUNTER — Ambulatory Visit (INDEPENDENT_AMBULATORY_CARE_PROVIDER_SITE_OTHER): Payer: Medicaid Other | Admitting: Advanced Practice Midwife

## 2017-03-09 ENCOUNTER — Other Ambulatory Visit: Payer: Self-pay | Admitting: Advanced Practice Midwife

## 2017-03-09 ENCOUNTER — Telehealth: Payer: Self-pay

## 2017-03-09 ENCOUNTER — Encounter: Payer: Self-pay | Admitting: Advanced Practice Midwife

## 2017-03-09 VITALS — BP 120/59 | HR 114 | Wt 137.0 lb

## 2017-03-09 DIAGNOSIS — Z3490 Encounter for supervision of normal pregnancy, unspecified, unspecified trimester: Secondary | ICD-10-CM

## 2017-03-09 NOTE — Telephone Encounter (Signed)
Patient induction scheduled. Armandina StammerJennifer Howard RN

## 2017-03-09 NOTE — Addendum Note (Signed)
Addended by: Aviva SignsWILLIAMS, MARIE L on: 03/09/2017 12:04 PM   Modules accepted: Kipp BroodSmartSet

## 2017-03-09 NOTE — Progress Notes (Signed)
Unable to enter hospital orders for induction

## 2017-03-09 NOTE — Progress Notes (Signed)
Erroneous

## 2017-03-09 NOTE — Patient Instructions (Signed)
Vaginal Delivery Vaginal delivery means that you will give birth by pushing your baby out of your birth canal (vagina). A team of health care providers will help you before, during, and after vaginal delivery. Birth experiences are unique for every woman and every pregnancy, and birth experiences vary depending on where you choose to give birth. What should I do to prepare for my baby's birth? Before your baby is born, it is important to talk with your health care provider about:  Your labor and delivery preferences. These may include: ? Medicines that you may be given. ? How you will manage your pain. This might include non-medical pain relief techniques or injectable pain relief such as epidural analgesia. ? How you and your baby will be monitored during labor and delivery. ? Who may be in the labor and delivery room with you. ? Your feelings about surgical delivery of your baby (cesarean delivery, or C-section) if this becomes necessary. ? Your feelings about receiving donated blood through an IV tube (blood transfusion) if this becomes necessary.  Whether you are able: ? To take pictures or videos of the birth. ? To eat during labor and delivery. ? To move around, walk, or change positions during labor and delivery.  What to expect after your baby is born, such as: ? Whether delayed umbilical cord clamping and cutting is offered. ? Who will care for your baby right after birth. ? Medicines or tests that may be recommended for your baby. ? Whether breastfeeding is supported in your hospital or birth center. ? How long you will be in the hospital or birth center.  How any medical conditions you have may affect your baby or your labor and delivery experience.  To prepare for your baby's birth, you should also:  Attend all of your health care visits before delivery (prenatal visits) as recommended by your health care provider. This is important.  Prepare your home for your baby's  arrival. Make sure that you have: ? Diapers. ? Baby clothing. ? Feeding equipment. ? Safe sleeping arrangements for you and your baby.  Install a car seat in your vehicle. Have your car seat checked by a certified car seat installer to make sure that it is installed safely.  Think about who will help you with your new baby at home for at least the first several weeks after delivery.  What can I expect when I arrive at the birth center or hospital? Once you are in labor and have been admitted into the hospital or birth center, your health care provider may:  Review your pregnancy history and any concerns you have.  Insert an IV tube into one of your veins. This is used to give you fluids and medicines.  Check your blood pressure, pulse, temperature, and heart rate (vital signs).  Check whether your bag of water (amniotic sac) has broken (ruptured).  Talk with you about your birth plan and discuss pain control options.  Monitoring Your health care provider may monitor your contractions (uterine monitoring) and your baby's heart rate (fetal monitoring). You may need to be monitored:  Often, but not continuously (intermittently).  All the time or for long periods at a time (continuously). Continuous monitoring may be needed if: ? You are taking certain medicines, such as medicine to relieve pain or make your contractions stronger. ? You have pregnancy or labor complications.  Monitoring may be done by:  Placing a special stethoscope or a handheld monitoring device on your abdomen to   check your baby's heartbeat, and feeling your abdomen for contractions. This method of monitoring does not continuously record your baby's heartbeat or your contractions.  Placing monitors on your abdomen (external monitors) to record your baby's heartbeat and the frequency and length of contractions. You may not have to wear external monitors all the time.  Placing monitors inside of your uterus  (internal monitors) to record your baby's heartbeat and the frequency, length, and strength of your contractions. ? Your health care provider may use internal monitors if he or she needs more information about the strength of your contractions or your baby's heart rate. ? Internal monitors are put in place by passing a thin, flexible wire through your vagina and into your uterus. Depending on the type of monitor, it may remain in your uterus or on your baby's head until birth. ? Your health care provider will discuss the benefits and risks of internal monitoring with you and will ask for your permission before inserting the monitors.  Telemetry. This is a type of continuous monitoring that can be done with external or internal monitors. Instead of having to stay in bed, you are able to move around during telemetry. Ask your health care provider if telemetry is an option for you.  Physical exam Your health care provider may perform a physical exam. This may include:  Checking whether your baby is positioned: ? With the head toward your vagina (head-down). This is most common. ? With the head toward the top of your uterus (head-up or breech). If your baby is in a breech position, your health care provider may try to turn your baby to a head-down position so you can deliver vaginally. If it does not seem that your baby can be born vaginally, your provider may recommend surgery to deliver your baby. In rare cases, you may be able to deliver vaginally if your baby is head-up (breech delivery). ? Lying sideways (transverse). Babies that are lying sideways cannot be delivered vaginally.  Checking your cervix to determine: ? Whether it is thinning out (effacing). ? Whether it is opening up (dilating). ? How low your baby has moved into your birth canal.  What are the three stages of labor and delivery?  Normal labor and delivery is divided into the following three stages: Stage 1  Stage 1 is the  longest stage of labor, and it can last for hours or days. Stage 1 includes: ? Early labor. This is when contractions may be irregular, or regular and mild. Generally, early labor contractions are more than 10 minutes apart. ? Active labor. This is when contractions get longer, more regular, more frequent, and more intense. ? The transition phase. This is when contractions happen very close together, are very intense, and may last longer than during any other part of labor.  Contractions generally feel mild, infrequent, and irregular at first. They get stronger, more frequent (about every 2-3 minutes), and more regular as you progress from early labor through active labor and transition.  Many women progress through stage 1 naturally, but you may need help to continue making progress. If this happens, your health care provider may talk with you about: ? Rupturing your amniotic sac if it has not ruptured yet. ? Giving you medicine to help make your contractions stronger and more frequent.  Stage 1 ends when your cervix is completely dilated to 4 inches (10 cm) and completely effaced. This happens at the end of the transition phase. Stage 2  Once   your cervix is completely effaced and dilated to 4 inches (10 cm), you may start to feel an urge to push. It is common for the body to naturally take a rest before feeling the urge to push, especially if you received an epidural or certain other pain medicines. This rest period may last for up to 1-2 hours, depending on your unique labor experience.  During stage 2, contractions are generally less painful, because pushing helps relieve contraction pain. Instead of contraction pain, you may feel stretching and burning pain, especially when the widest part of your baby's head passes through the vaginal opening (crowning).  Your health care provider will closely monitor your pushing progress and your baby's progress through the vagina during stage 2.  Your  health care provider may massage the area of skin between your vaginal opening and anus (perineum) or apply warm compresses to your perineum. This helps it stretch as the baby's head starts to crown, which can help prevent perineal tearing. ? In some cases, an incision may be made in your perineum (episiotomy) to allow the baby to pass through the vaginal opening. An episiotomy helps to make the opening of the vagina larger to allow more room for the baby to fit through.  It is very important to breathe and focus so your health care provider can control the delivery of your baby's head. Your health care provider may have you decrease the intensity of your pushing, to help prevent perineal tearing.  After delivery of your baby's head, the shoulders and the rest of the body generally deliver very quickly and without difficulty.  Once your baby is delivered, the umbilical cord may be cut right away, or this may be delayed for 1-2 minutes, depending on your baby's health. This may vary among health care providers, hospitals, and birth centers.  If you and your baby are healthy enough, your baby may be placed on your chest or abdomen to help maintain the baby's temperature and to help you bond with each other. Some mothers and babies start breastfeeding at this time. Your health care team will dry your baby and help keep your baby warm during this time.  Your baby may need immediate care if he or she: ? Showed signs of distress during labor. ? Has a medical condition. ? Was born too early (prematurely). ? Had a bowel movement before birth (meconium). ? Shows signs of difficulty transitioning from being inside the uterus to being outside of the uterus. If you are planning to breastfeed, your health care team will help you begin a feeding. Stage 3  The third stage of labor starts immediately after the birth of your baby and ends after you deliver the placenta. The placenta is an organ that develops  during pregnancy to provide oxygen and nutrients to your baby in the womb.  Delivering the placenta may require some pushing, and you may have mild contractions. Breastfeeding can stimulate contractions to help you deliver the placenta.  After the placenta is delivered, your uterus should tighten (contract) and become firm. This helps to stop bleeding in your uterus. To help your uterus contract and to control bleeding, your health care provider may: ? Give you medicine by injection, through an IV tube, by mouth, or through your rectum (rectally). ? Massage your abdomen or perform a vaginal exam to remove any blood clots that are left in your uterus. ? Empty your bladder by placing a thin, flexible tube (catheter) into your bladder. ? Encourage   you to breastfeed your baby. After labor is over, you and your baby will be monitored closely to ensure that you are both healthy until you are ready to go home. Your health care team will teach you how to care for yourself and your baby. This information is not intended to replace advice given to you by your health care provider. Make sure you discuss any questions you have with your health care provider. Document Released: 12/31/2007 Document Revised: 10/11/2015 Document Reviewed: 04/07/2015 Elsevier Interactive Patient Education  2018 [employer]  

## 2017-03-12 ENCOUNTER — Inpatient Hospital Stay (HOSPITAL_COMMUNITY)
Admission: RE | Admit: 2017-03-12 | Discharge: 2017-03-14 | DRG: 797 | Disposition: A | Discharge status: Home Health | Payer: Medicaid Other | Source: Ambulatory Visit | Attending: Obstetrics and Gynecology | Admitting: Obstetrics and Gynecology

## 2017-03-12 ENCOUNTER — Inpatient Hospital Stay (HOSPITAL_COMMUNITY): Admission: RE | Admit: 2017-03-12 | Payer: Medicaid Other | Source: Ambulatory Visit

## 2017-03-12 ENCOUNTER — Inpatient Hospital Stay (HOSPITAL_COMMUNITY): Payer: Medicaid Other | Admitting: Anesthesiology

## 2017-03-12 ENCOUNTER — Encounter (HOSPITAL_COMMUNITY): Payer: Self-pay

## 2017-03-12 DIAGNOSIS — O9902 Anemia complicating childbirth: Secondary | ICD-10-CM | POA: Diagnosis present

## 2017-03-12 DIAGNOSIS — Z302 Encounter for sterilization: Secondary | ICD-10-CM | POA: Diagnosis not present

## 2017-03-12 DIAGNOSIS — D649 Anemia, unspecified: Secondary | ICD-10-CM | POA: Diagnosis present

## 2017-03-12 DIAGNOSIS — O99324 Drug use complicating childbirth: Secondary | ICD-10-CM | POA: Diagnosis present

## 2017-03-12 DIAGNOSIS — Z3490 Encounter for supervision of normal pregnancy, unspecified, unspecified trimester: Secondary | ICD-10-CM | POA: Diagnosis present

## 2017-03-12 DIAGNOSIS — Q270 Congenital absence and hypoplasia of umbilical artery: Secondary | ICD-10-CM

## 2017-03-12 DIAGNOSIS — Z3A39 39 weeks gestation of pregnancy: Secondary | ICD-10-CM

## 2017-03-12 DIAGNOSIS — Z87891 Personal history of nicotine dependence: Secondary | ICD-10-CM

## 2017-03-12 DIAGNOSIS — F1121 Opioid dependence, in remission: Secondary | ICD-10-CM | POA: Diagnosis present

## 2017-03-12 LAB — RPR: RPR Ser Ql: NONREACTIVE

## 2017-03-12 LAB — CBC
HCT: 33.3 % — ABNORMAL LOW (ref 36.0–46.0)
Hemoglobin: 11.3 g/dL — ABNORMAL LOW (ref 12.0–15.0)
MCH: 31.6 pg (ref 26.0–34.0)
MCHC: 33.9 g/dL (ref 30.0–36.0)
MCV: 93 fL (ref 78.0–100.0)
Platelets: 198 10*3/uL (ref 150–400)
RBC: 3.58 MIL/uL — ABNORMAL LOW (ref 3.87–5.11)
RDW: 12.4 % (ref 11.5–15.5)
WBC: 10.5 10*3/uL (ref 4.0–10.5)

## 2017-03-12 LAB — TYPE AND SCREEN
ABO/RH(D): B POS
Antibody Screen: NEGATIVE

## 2017-03-12 MED ORDER — IBUPROFEN 600 MG PO TABS
600.0000 mg | ORAL_TABLET | Freq: Four times a day (QID) | ORAL | Status: DC
  Administered 2017-03-12 – 2017-03-13 (×3): 600 mg via ORAL
  Filled 2017-03-12 (×3): qty 1

## 2017-03-12 MED ORDER — LIDOCAINE HCL (PF) 1 % IJ SOLN
INTRAMUSCULAR | Status: DC | PRN
  Administered 2017-03-12: 4 mL
  Administered 2017-03-12: 6 mL via EPIDURAL

## 2017-03-12 MED ORDER — DIPHENHYDRAMINE HCL 50 MG/ML IJ SOLN: 12.5000 mg | INTRAMUSCULAR | Status: DC | PRN

## 2017-03-12 MED ORDER — TERBUTALINE SULFATE 1 MG/ML IJ SOLN
0.2500 mg | Freq: Once | INTRAMUSCULAR | Status: DC | PRN
  Filled 2017-03-12: qty 1

## 2017-03-12 MED ORDER — SENNOSIDES-DOCUSATE SODIUM 8.6-50 MG PO TABS
2.0000 | ORAL_TABLET | ORAL | Status: DC
  Administered 2017-03-12: 2 via ORAL
  Filled 2017-03-12: qty 2

## 2017-03-12 MED ORDER — ACETAMINOPHEN 325 MG PO TABS
650.0000 mg | ORAL_TABLET | ORAL | Status: DC | PRN
  Administered 2017-03-12: 650 mg via ORAL
  Filled 2017-03-12: qty 2

## 2017-03-12 MED ORDER — PENICILLIN G POT IN DEXTROSE 60000 UNIT/ML IV SOLN
3.0000 10*6.[IU] | INTRAVENOUS | Status: DC
  Filled 2017-03-12: qty 50

## 2017-03-12 MED ORDER — SOD CITRATE-CITRIC ACID 500-334 MG/5ML PO SOLN
30.0000 mL | ORAL | Status: DC | PRN
  Administered 2017-03-12: 30 mL via ORAL
  Filled 2017-03-12: qty 15

## 2017-03-12 MED ORDER — ZOLPIDEM TARTRATE 5 MG PO TABS: 5.0000 mg | ORAL_TABLET | Freq: Every evening | ORAL | Status: DC | PRN

## 2017-03-12 MED ORDER — FAMOTIDINE 20 MG PO TABS
40.0000 mg | ORAL_TABLET | Freq: Once | ORAL | Status: AC
  Administered 2017-03-13: 40 mg via ORAL
  Filled 2017-03-12: qty 2

## 2017-03-12 MED ORDER — METOCLOPRAMIDE HCL 10 MG PO TABS
10.0000 mg | ORAL_TABLET | Freq: Once | ORAL | Status: AC
  Administered 2017-03-13: 10 mg via ORAL
  Filled 2017-03-12: qty 1

## 2017-03-12 MED ORDER — SIMETHICONE 80 MG PO CHEW: 80.0000 mg | CHEWABLE_TABLET | ORAL | Status: DC | PRN

## 2017-03-12 MED ORDER — FENTANYL 2.5 MCG/ML BUPIVACAINE 1/10 % EPIDURAL INFUSION (WH - ANES)
14.0000 mL/h | INTRAMUSCULAR | Status: DC | PRN
  Administered 2017-03-12: 14 mL/h via EPIDURAL
  Filled 2017-03-12: qty 100

## 2017-03-12 MED ORDER — LACTATED RINGERS IV SOLN
500.0000 mL | INTRAVENOUS | Status: DC | PRN
  Administered 2017-03-12: 500 mL via INTRAVENOUS

## 2017-03-12 MED ORDER — DIBUCAINE 1 % RE OINT: 1.0000 "application " | TOPICAL_OINTMENT | RECTAL | Status: DC | PRN

## 2017-03-12 MED ORDER — PENICILLIN G POTASSIUM 5000000 UNITS IJ SOLR
5.0000 10*6.[IU] | Freq: Once | INTRAVENOUS | Status: DC
  Filled 2017-03-12: qty 5

## 2017-03-12 MED ORDER — LIDOCAINE HCL (PF) 1 % IJ SOLN
30.0000 mL | INTRAMUSCULAR | Status: DC | PRN
  Filled 2017-03-12: qty 30

## 2017-03-12 MED ORDER — EPHEDRINE 5 MG/ML INJ
10.0000 mg | INTRAVENOUS | Status: DC | PRN
  Filled 2017-03-12: qty 2

## 2017-03-12 MED ORDER — PHENYLEPHRINE 40 MCG/ML (10ML) SYRINGE FOR IV PUSH (FOR BLOOD PRESSURE SUPPORT)
80.0000 ug | PREFILLED_SYRINGE | INTRAVENOUS | Status: DC | PRN
  Filled 2017-03-12: qty 5
  Filled 2017-03-12: qty 10

## 2017-03-12 MED ORDER — ACETAMINOPHEN 325 MG PO TABS
650.0000 mg | ORAL_TABLET | ORAL | Status: DC | PRN
  Administered 2017-03-13: 650 mg via ORAL
  Filled 2017-03-12: qty 2

## 2017-03-12 MED ORDER — LACTATED RINGERS IV SOLN
INTRAVENOUS | Status: DC
  Administered 2017-03-13: 07:00:00 via INTRAVENOUS

## 2017-03-12 MED ORDER — OXYTOCIN 40 UNITS IN LACTATED RINGERS INFUSION - SIMPLE MED: 2.5000 [IU]/h | INTRAVENOUS | Status: DC

## 2017-03-12 MED ORDER — BUPRENORPHINE HCL 8 MG SL SUBL: 8.0000 mg | SUBLINGUAL_TABLET | Freq: Every day | SUBLINGUAL | Status: DC

## 2017-03-12 MED ORDER — LACTATED RINGERS IV SOLN
INTRAVENOUS | Status: DC
  Administered 2017-03-12 (×2): via INTRAVENOUS

## 2017-03-12 MED ORDER — PRENATAL MULTIVITAMIN CH
1.0000 | ORAL_TABLET | Freq: Every day | ORAL | Status: DC
  Administered 2017-03-13: 1 via ORAL
  Filled 2017-03-12: qty 1

## 2017-03-12 MED ORDER — BENZOCAINE-MENTHOL 20-0.5 % EX AERO: 1.0000 "application " | INHALATION_SPRAY | CUTANEOUS | Status: DC | PRN

## 2017-03-12 MED ORDER — TETANUS-DIPHTH-ACELL PERTUSSIS 5-2.5-18.5 LF-MCG/0.5 IM SUSP: 0.5000 mL | Freq: Once | INTRAMUSCULAR | Status: DC

## 2017-03-12 MED ORDER — ONDANSETRON HCL 4 MG PO TABS: 4.0000 mg | ORAL_TABLET | ORAL | Status: DC | PRN

## 2017-03-12 MED ORDER — OXYTOCIN 40 UNITS IN LACTATED RINGERS INFUSION - SIMPLE MED
1.0000 m[IU]/min | INTRAVENOUS | Status: DC
  Administered 2017-03-12: 2 m[IU]/min via INTRAVENOUS
  Filled 2017-03-12: qty 1000

## 2017-03-12 MED ORDER — DIPHENHYDRAMINE HCL 25 MG PO CAPS: 25.0000 mg | ORAL_CAPSULE | Freq: Four times a day (QID) | ORAL | Status: DC | PRN

## 2017-03-12 MED ORDER — COCONUT OIL OIL: 1.0000 "application " | TOPICAL_OIL | Status: DC | PRN

## 2017-03-12 MED ORDER — OXYTOCIN BOLUS FROM INFUSION
500.0000 mL | Freq: Once | INTRAVENOUS | Status: AC
  Administered 2017-03-12: 500 mL via INTRAVENOUS

## 2017-03-12 MED ORDER — LACTATED RINGERS IV SOLN: 500.0000 mL | Freq: Once | INTRAVENOUS | Status: DC

## 2017-03-12 MED ORDER — PHENYLEPHRINE 40 MCG/ML (10ML) SYRINGE FOR IV PUSH (FOR BLOOD PRESSURE SUPPORT)
80.0000 ug | PREFILLED_SYRINGE | INTRAVENOUS | Status: DC | PRN
  Filled 2017-03-12: qty 5

## 2017-03-12 MED ORDER — WITCH HAZEL-GLYCERIN EX PADS: 1.0000 "application " | MEDICATED_PAD | CUTANEOUS | Status: DC | PRN

## 2017-03-12 MED ORDER — ONDANSETRON HCL 4 MG/2ML IJ SOLN: 4.0000 mg | INTRAMUSCULAR | Status: DC | PRN

## 2017-03-12 MED ORDER — BUPRENORPHINE HCL 8 MG SL SUBL: 12.0000 mg | SUBLINGUAL_TABLET | Freq: Every day | SUBLINGUAL | Status: DC

## 2017-03-12 MED ORDER — BUPRENORPHINE HCL 8 MG SL SUBL
8.0000 mg | SUBLINGUAL_TABLET | Freq: Every day | SUBLINGUAL | Status: DC
  Administered 2017-03-13 – 2017-03-14 (×2): 8 mg via SUBLINGUAL
  Filled 2017-03-12 (×2): qty 1

## 2017-03-12 MED ORDER — ONDANSETRON HCL 4 MG/2ML IJ SOLN
4.0000 mg | Freq: Four times a day (QID) | INTRAMUSCULAR | Status: DC | PRN
  Administered 2017-03-12: 4 mg via INTRAVENOUS
  Filled 2017-03-12: qty 2

## 2017-03-12 MED ORDER — BUPRENORPHINE HCL 8 MG SL SUBL
12.0000 mg | SUBLINGUAL_TABLET | Freq: Every day | SUBLINGUAL | Status: DC
  Administered 2017-03-12 – 2017-03-14 (×3): 12 mg via SUBLINGUAL
  Filled 2017-03-12 (×3): qty 2

## 2017-03-12 MED ORDER — BUPRENORPHINE HCL-NALOXONE HCL 8-2 MG SL SUBL: 1.0000 | SUBLINGUAL_TABLET | Freq: Two times a day (BID) | SUBLINGUAL | Status: DC

## 2017-03-12 NOTE — H&P (Signed)
LABOR AND DELIVERY ADMISSION HISTORY AND PHYSICAL NOTE  Olivia Faulkner is a 31 y.o. female G3P1011 with IUP at [redacted]w[redacted]d by 9wk [place] presenting for IOL for single umbilical artery.  She reports positive fetal movement. She denies leakage of fluid or vaginal bleeding.  Prenatal History/Complications: PNC at [facility] Pregnancy complications:  - 2VC   Past Medical History: Past Medical History:  Diagnosis Date  . Allergic rhinitis   . Anxiety   . Asthma    allergic  . Depression   . Eczema   . Former smoker   . History of chicken pox   . Marijuana use   . Migraine   . MRSA (methicillin resistant staph aureus) culture positive   . Pregnancy induced hypertension    with last pregnancy  . Suboxone maintenance treatment complicating pregnancy, antepartum (HCC)   . Vaginal Pap smear, abnormal    LGSIL    Past Surgical History: Past Surgical History:  Procedure Laterality Date  . NO PAST SURGERIES  04/16/2011    Obstetrical History: OB History    Gravida Para Term Preterm AB Living   3 1 1   1 1    SAB TAB Ectopic Multiple Live Births   1     0 1      Social History: Social History   Socioeconomic History  . Marital status: Single    Spouse name: None  . Number of children: None  . Years of education: None  . Highest education level: None  Social Needs  . Financial resource strain: None  . Food insecurity - worry: None  . Food insecurity - inability: None  . Transportation needs - medical: None  . Transportation needs - non-medical: None  Occupational History  . None  Tobacco Use  . Smoking status: Former Smoker    Packs/day: 0.50    Types: Cigarettes    Last attempt to quit: 10/04/2014    Years since quitting: 2.4  . Smokeless tobacco: Never Used  Substance and Sexual Activity  . Alcohol use: No  . Drug use: Yes    Types: Marijuana    Comment: suboxone, MJ last used 17mo ago  . Sexual activity: Yes    Birth control/protection: None  Other Topics Concern  . None   Social History Narrative  . None    Family History: Family History  Problem Relation Age of Onset  . Alcohol abuse Maternal Grandfather   . Lung cancer Mother        Living  . Hypertension Mother   . COPD Mother   . Cancer Mother   . Heart disease Maternal Grandmother   . Kidney disease Maternal Grandmother   . Hypertension Maternal Grandmother   . Lung cancer Maternal Grandmother   . Diabetes Father        Living  . Hypertension Father   . Skin cancer Father   . Heart disease Paternal Grandmother   . Heart disease Paternal Grandfather   . Alcohol abuse Maternal Aunt   . COPD Maternal Aunt   . Alcohol abuse Brother   . Asthma Brother     Allergies: Allergies  Allergen Reactions  . Sulfa Antibiotics Hives and Itching    Medications Prior to Admission  Medication Sig Dispense Refill Last Dose  . Buprenorphine HCl-Naloxone HCl (SUBOXONE) 8-2 MG FILM Place under the tongue. 1 film in the morning and 1 and 1/2 film in the evening   03/12/2017 at 0530  . diphenhydramine-acetaminophen (TYLENOL PM) 25-500 MG  TABS tablet Take 1 tablet by mouth at bedtime as needed (sleep).    03/11/2017 at Unknown time  . Prenatal Vit-Fe Fumarate-FA (PRENATAL VITAMINS PLUS) 27-1 MG TABS Take 1 tablet by mouth daily. 30 tablet 11 Past Week at Unknown time  . PROVENTIL HFA 108 (90 Base) MCG/ACT inhaler INHALE ONE TO TWO PUFFS INTO LUNGS EVERY 6 HOURS AS NEEDED FOR WHEEZING FOR SHORTNESS OF BREATH 7 each 0 03/11/2017 at Unknown time  . ranitidine (ZANTAC) 150 MG tablet Take 150 mg by mouth 2 (two) times daily.   03/12/2017 at Unknown time  . triamcinolone cream (KENALOG) 0.5 % Apply 1 application topically 3 (three) times daily. 30 g 3 03/12/2017 at Unknown time  . Doxylamine-Pyridoxine 10-10 MG TBEC Take 1 tablet at bedtime by mouth. (Patient not taking: Reported on 03/09/2017) 60 tablet 3 Not Taking  . ondansetron (ZOFRAN ODT) 4 MG disintegrating tablet Take 1 tablet (4 mg total) by mouth every 6 (six)  hours as needed for nausea. 20 tablet 0 Taking     Review of Systems  All systems reviewed and negative except as stated in HPI  Physical Exam Blood pressure 119/62, pulse 81, temperature 98.8 F (37.1 C), temperature source Oral, resp. rate 18, height 5\' 3"  (1.6 m), weight 137 lb (62.1 kg), last menstrual period 06/01/2016, unknown if currently breastfeeding. General appearance: alert, cooperative and no distress Lungs: clear to auscultation bilaterally Heart: regular rate and rhythm Abdomen: soft, non-tender; bowel sounds normal Extremities: No calf swelling or tenderness Presentation: cephalic by cervical exam  Fetal monitoring: 120/ moderate/ +accel no decel  Uterine activity: 2-4 Dilation: 2.5 Effacement (%): 60 Station: -2  Prenatal labs: ABO, Rh: --/--/B POS (12/07 0820) Antibody: NEG (12/07 0820) Rubella: 1.00 (05/11 1056) RPR: Non Reactive (10/08 0854)  HBsAg: Negative (05/11 1056)  HIV:   Non reactive (10/8) GC/Chlamydia: Negative (11/13) GBS: Negative (11/13 0000)  2 hr Glucola: normal Genetic screening:  negative Anatomy US: 2VC   Clinic  [facility] Prenatal Labs  Dating  9 wk us Blood type:   B positive  Genetic Screen First Screen: WNL  AFP: WNL Antibody: neg  Anatomic US Female, 2VC Rubella:  Immune  GTT  Third trimester: Drank soda before 1 st GTT. Fasting CBG 97. Repeat Nml.  RPR:   NR  Flu vaccine   HBsAg:   Neg  TDaP vaccine 02/09/17                                    HIV:   NR  Baby Food Breast                                            GBS: Pos  Contraception BTL papers signed 01/08/17 Pap:  ASCUS 10/2014, no reflex HPV screen> repap 05/02/15 LSIL  Circumcision NA   Pediatrician  [facility]   Support Person  Kathlene NovemberMike [phone]([place])     Prenatal Transfer Tool  Maternal Diabetes: No Genetic Screening: Normal Maternal Ultrasounds/Referrals: Abnormal:  Findings:   Other: 2VC Fetal Ultrasounds or other Referrals:  None Maternal Substance Abuse:  Yes:  Type:  Other:  Suboxone - on subutex during labor per consult with pharmacist  Significant Maternal Medications:  None Significant Maternal Lab Results: Lab values include: Group B Strep negative  Results for  orders placed or performed during the hospital encounter of 03/12/17 (from the past 24 hour(s))  CBC   Collection Time: 03/12/17  8:20 AM  Result Value Ref Range   WBC 10.5 4.0 - 10.5 K/uL   RBC 3.58 (L) 3.87 - 5.11 MIL/uL   Hemoglobin 11.3 (L) 12.0 - 15.0 g/dL   HCT 16.133.3 (L) 09.636.0 - 04.546.0 %   MCV 93.0 78.0 - 100.0 fL   MCH 31.6 26.0 - 34.0 pg   MCHC 33.9 30.0 - 36.0 g/dL   RDW 40.912.4 81.111.5 - 91.415.5 %   Platelets 198 150 - 400 K/uL  Type and screen   Collection Time: 03/12/17  8:20 AM  Result Value Ref Range   ABO/RH(D) B POS    Antibody Screen NEG    Sample Expiration 03/15/2017     Patient Active Problem List   Diagnosis Date Noted  . Encounter for induction of labor 03/12/2017  . Opioid use disorder, severe, in early remission (HCC) 10/30/2016  . Single umbilical artery 10/21/2016  . Pregnancy 05/30/2015  . 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 Anemia in pregnancy 03/06/2015  . 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 High risk pregnancy, antepartum 11/08/2014  . Drug use affecting pregnancy 11/08/2014  . Eczema 06/06/2014  . Anxiety and depression 04/16/2011    Assessment: Olivia Faulkner is a 31 y.o. G3P1011 at 8669w0d here for IOL for single umbilical artery   #Labor: IOL on pitocin  #Pain: Planning epidural  #FWB: Cat 1 #ID:  GBS neg  #MOF: Breast #MOC: BTL- papers signed on 10/5 #Circ:  n/a  Sharyon CableRogers, Jordany Russett C, CNM 03/12/2017, 10:37 AM

## 2017-03-12 NOTE — Progress Notes (Signed)
LABOR PROGRESS NOTE  Olivia RichJulie A Faulkner is a 31 y.o. G3P1011 at 1465w0d  admitted for IOL for single umbilical artery   Subjective: Patient comfortable with epidural   Objective: BP 106/65   Pulse 66   Temp 98.8 F (37.1 C) (Oral)   Resp 16   Ht 5\' 3"  (1.6 m)   Wt 137 lb (62.1 kg)   LMP 06/01/2016 (Exact Date)   SpO2 100%   BMI 24.27 kg/m  or  Vitals:   03/12/17 1255 03/12/17 1300 03/12/17 1306 03/12/17 1310  BP: (!) 111/53 104/60 114/62 106/65  Pulse: 61 73 (!) 54 66  Resp:    16  Temp:      TempSrc:      SpO2: 100% 100%  100%  Weight:      Height:        Dilation: 3 Effacement (%): 70 Cervical Position: Posterior Station: -2 Presentation: Vertex Exam by:: Arne ClevelandJennifer Hazelwood, RN FHT: baseline rate 115, moderate varibility, no acel, no decel Toco: 2-4  Labs: Lab Results  Component Value Date   WBC 10.5 03/12/2017   HGB 11.3 (L) 03/12/2017   HCT 33.3 (L) 03/12/2017   MCV 93.0 03/12/2017   PLT 198 03/12/2017    Patient Active Problem List   Diagnosis Date Noted  . Encounter for induction of labor 03/12/2017  . Opioid use disorder, severe, in early remission (HCC) 10/30/2016  . Single umbilical artery 10/21/2016  . Pregnancy 05/30/2015  . 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 Anemia in pregnancy 03/06/2015  . 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 High risk pregnancy, antepartum 11/08/2014  . Drug use affecting pregnancy 11/08/2014  . Eczema 06/06/2014  . Anxiety and depression 04/16/2011    Assessment / Plan: 31 y.o. G3P1011 at 7965w0d here for IOL for single umbilical artery   Labor: Progressing well with pitocin titration  Fetal Wellbeing:  Cat 2 Pain Control:  epidural Anticipated MOD:  SVD  Sharyon CableRogers, Zareen Jamison C, CNM 03/12/2017, 1:22 PM

## 2017-03-12 NOTE — Anesthesia Preprocedure Evaluation (Signed)
Anesthesia Evaluation  Patient identified by MRN, date of birth, ID band Patient awake    Reviewed: Allergy & Precautions, H&P , Patient's Chart, lab work & pertinent test results  Airway Mallampati: II  TM Distance: >3 FB Neck ROM: full    Dental no notable dental hx.    Pulmonary asthma , former smoker,    Pulmonary exam normal breath sounds clear to auscultation       Cardiovascular Exercise Tolerance: Good hypertension,  Rhythm:regular Rate:Normal     Neuro/Psych    GI/Hepatic   Endo/Other    Renal/GU      Musculoskeletal   Abdominal   Peds  Hematology   Anesthesia Other Findings   Reproductive/Obstetrics                             Anesthesia Physical Anesthesia Plan  ASA: II  Anesthesia Plan: Epidural   Post-op Pain Management:    Induction:   PONV Risk Score and Plan:   Airway Management Planned:   Additional Equipment:   Intra-op Plan:   Post-operative Plan:   Informed Consent: I have reviewed the patients History and Physical, chart, labs and discussed the procedure including the risks, benefits and alternatives for the proposed anesthesia with the patient or authorized representative who has indicated his/her understanding and acceptance.   Dental Advisory Given  Plan Discussed with:   Anesthesia Plan Comments: (Labs checked- platelets confirmed with RN in room. Fetal heart tracing, per RN, reported to be stable enough for sitting procedure. Discussed epidural, and patient consents to the procedure:  included risk of possible headache,backache, failed block, allergic reaction, and nerve injury. This patient was asked if she had any questions or concerns before the procedure started.)        Anesthesia Quick Evaluation

## 2017-03-12 NOTE — Anesthesia Pain Management Evaluation Note (Signed)
  [facility] Pain Management Visit Note  Patient: Olivia RichJulie A Singleterry, 31 y.o., female  "Hello I am a member of the anesthesia team at [facility]. We have an anesthesia team available at all times to provide care throughout the hospital, including epidural management and anesthesia for C-section. I don't know your plan for the delivery whether it a natural birth, water birth, IV sedation, nitrous supplementation, doula or epidural, but we want to meet your pain goals."   1.Was your pain managed to your expectations on prior hospitalizations?   Yes   2.What is your expectation for pain management during this hospitalization?     Epidural  3.How can we help you reach that goal? Pt currently comfortable with epidural  Record the patient's initial score and the patient's pain goal.   Pain: 0  Pain Goal: 0 [facility] wants you to be able to say your pain was always managed very well.  [name] 03/12/2017

## 2017-03-12 NOTE — Progress Notes (Signed)
Labor Progress Note Olivia RichJulie A Faulkner is a 31 y.o. G3P1011 at 4353w0d presented for IOL for single umbilical artery  S: Patient comfortable with epidural- starting to feel some pressure in her bottom   O:  BP (!) 109/57   Pulse 71   Temp 98.3 F (36.8 C) (Oral)   Resp 16   Ht 5\' 3"  (1.6 m)   Wt 137 lb (62.1 kg)   LMP 06/01/2016 (Exact Date)   SpO2 100%   BMI 24.27 kg/m  EFM: 115 /moderate /+accel / early and variable decel  CVE: Dilation: 7 Effacement (%): 100 Cervical Position: Posterior Station: 0 Presentation: Vertex Exam by:: Aundria Rudogers CNM   A&P: 31 y.o. G3P1011 3553w0d IOL for SUA #Labor: Progressing well on pitocin- AROM @ 1654 #Pain: Epidural #FWB: Cat 2 #GBS negative  Sharyon CableVeronica C Tyson Parkison, CNM 5:15 PM

## 2017-03-12 NOTE — Anesthesia Procedure Notes (Signed)
Epidural Patient location during procedure: OB  Staffing Anesthesiologist: Macie Baum, MD  Preanesthetic Checklist Completed: patient identified, pre-op evaluation, timeout performed, IV checked, risks and benefits discussed and monitors and equipment checked  Epidural Patient position: sitting Prep: DuraPrep Patient monitoring: blood pressure and continuous pulse ox Approach: right paramedian Location: L3-L4 Injection technique: LOR air  Needle:  Needle type: Tuohy  Needle gauge: 17 G Needle insertion depth: 4 cm Catheter type: closed end flexible Catheter size: 19 Gauge Catheter at skin depth: 10 cm Test dose: negative  Assessment Sensory level: T8  Additional Notes  Dosing of Epidural:  1st dose, through catheter .............................................  Xylocaine 40 mg  2nd dose, through catheter, after waiting 3 minutes.........Xylocaine 60 mg    As each dose occurred, patient was free of IV sx; and patient exhibited no evidence of SA injection.  Patient is more comfortable after epidural dosed. Please see RN's note for documentation of vital signs,and FHR which are stable.  Patient reminded not to try to ambulate with numb legs, and that an RN must be present when she attempts to get up.           

## 2017-03-13 ENCOUNTER — Inpatient Hospital Stay (HOSPITAL_COMMUNITY): Payer: Medicaid Other | Admitting: Anesthesiology

## 2017-03-13 ENCOUNTER — Encounter (HOSPITAL_COMMUNITY)
Admission: RE | Disposition: A | Discharge status: Home Health | Payer: Self-pay | Source: Ambulatory Visit | Attending: Obstetrics and Gynecology

## 2017-03-13 ENCOUNTER — Encounter (HOSPITAL_COMMUNITY): Payer: Self-pay

## 2017-03-13 DIAGNOSIS — Z302 Encounter for sterilization: Secondary | ICD-10-CM

## 2017-03-13 HISTORY — PX: TUBAL LIGATION: SHX77

## 2017-03-13 SURGERY — LIGATION, FALLOPIAN TUBE, POSTPARTUM
Anesthesia: Epidural | Laterality: Bilateral

## 2017-03-13 MED ORDER — BUPIVACAINE HCL 0.25 % IJ SOLN
INTRAMUSCULAR | Status: DC | PRN
  Administered 2017-03-13: 11 mL

## 2017-03-13 MED ORDER — WITCH HAZEL-GLYCERIN EX PADS: 1.0000 "application " | MEDICATED_PAD | CUTANEOUS | Status: DC | PRN

## 2017-03-13 MED ORDER — DIPHENHYDRAMINE HCL 25 MG PO CAPS: 25.0000 mg | ORAL_CAPSULE | Freq: Four times a day (QID) | ORAL | Status: DC | PRN

## 2017-03-13 MED ORDER — ACETAMINOPHEN 325 MG PO TABS
650.0000 mg | ORAL_TABLET | ORAL | Status: DC | PRN
  Administered 2017-03-13 – 2017-03-14 (×3): 650 mg via ORAL
  Filled 2017-03-13 (×3): qty 2

## 2017-03-13 MED ORDER — DEXMEDETOMIDINE HCL IN NACL 200 MCG/50ML IV SOLN
INTRAVENOUS | Status: AC
  Filled 2017-03-13: qty 50

## 2017-03-13 MED ORDER — TETANUS-DIPHTH-ACELL PERTUSSIS 5-2.5-18.5 LF-MCG/0.5 IM SUSP: 0.5000 mL | Freq: Once | INTRAMUSCULAR | Status: DC

## 2017-03-13 MED ORDER — BENZOCAINE-MENTHOL 20-0.5 % EX AERO: 1.0000 "application " | INHALATION_SPRAY | CUTANEOUS | Status: DC | PRN

## 2017-03-13 MED ORDER — IBUPROFEN 600 MG PO TABS
600.0000 mg | ORAL_TABLET | Freq: Four times a day (QID) | ORAL | Status: DC
  Administered 2017-03-13 – 2017-03-14 (×4): 600 mg via ORAL
  Filled 2017-03-13 (×5): qty 1

## 2017-03-13 MED ORDER — MIDAZOLAM HCL 5 MG/5ML IJ SOLN
INTRAMUSCULAR | Status: DC | PRN
  Administered 2017-03-13 (×3): 1 mg via INTRAVENOUS

## 2017-03-13 MED ORDER — PRENATAL MULTIVITAMIN CH
1.0000 | ORAL_TABLET | Freq: Every day | ORAL | Status: DC
  Administered 2017-03-14: 1 via ORAL
  Filled 2017-03-13: qty 1

## 2017-03-13 MED ORDER — SIMETHICONE 80 MG PO CHEW: 80.0000 mg | CHEWABLE_TABLET | ORAL | Status: DC | PRN

## 2017-03-13 MED ORDER — ONDANSETRON HCL 4 MG/2ML IJ SOLN
INTRAMUSCULAR | Status: AC
  Filled 2017-03-13: qty 2

## 2017-03-13 MED ORDER — ONDANSETRON HCL 4 MG PO TABS: 4.0000 mg | ORAL_TABLET | ORAL | Status: DC | PRN

## 2017-03-13 MED ORDER — SODIUM BICARBONATE 8.4 % IV SOLN
INTRAVENOUS | Status: DC | PRN
  Administered 2017-03-13: 2 mL via EPIDURAL
  Administered 2017-03-13 (×3): 5 mL via EPIDURAL

## 2017-03-13 MED ORDER — BUPIVACAINE HCL (PF) 0.25 % IJ SOLN
INTRAMUSCULAR | Status: AC
  Filled 2017-03-13: qty 30

## 2017-03-13 MED ORDER — ONDANSETRON HCL 4 MG/2ML IJ SOLN: 4.0000 mg | INTRAMUSCULAR | Status: DC | PRN

## 2017-03-13 MED ORDER — ONDANSETRON HCL 4 MG/2ML IJ SOLN
INTRAMUSCULAR | Status: DC | PRN
Start: 2017-03-13 — End: 2017-03-13
  Administered 2017-03-13: 4 mg via INTRAVENOUS

## 2017-03-13 MED ORDER — DEXMEDETOMIDINE HCL IN NACL 200 MCG/50ML IV SOLN
INTRAVENOUS | Status: DC | PRN
  Administered 2017-03-13: 4 ug via INTRAVENOUS
  Administered 2017-03-13 (×2): 8 ug via INTRAVENOUS
  Administered 2017-03-13: 4 ug via INTRAVENOUS

## 2017-03-13 MED ORDER — COCONUT OIL OIL: 1.0000 "application " | TOPICAL_OIL | Status: DC | PRN

## 2017-03-13 MED ORDER — ZOLPIDEM TARTRATE 5 MG PO TABS: 5.0000 mg | ORAL_TABLET | Freq: Every evening | ORAL | Status: DC | PRN

## 2017-03-13 MED ORDER — MIDAZOLAM HCL 2 MG/2ML IJ SOLN
INTRAMUSCULAR | Status: AC
  Filled 2017-03-13: qty 2

## 2017-03-13 MED ORDER — DIBUCAINE 1 % RE OINT: 1.0000 "application " | TOPICAL_OINTMENT | RECTAL | Status: DC | PRN

## 2017-03-13 MED ORDER — FENTANYL CITRATE (PF) 100 MCG/2ML IJ SOLN: 25.0000 ug | INTRAMUSCULAR | Status: DC | PRN

## 2017-03-13 MED ORDER — LACTATED RINGERS IV SOLN
INTRAVENOUS | Status: DC | PRN
  Administered 2017-03-13: 10:00:00 via INTRAVENOUS

## 2017-03-13 MED ORDER — SENNOSIDES-DOCUSATE SODIUM 8.6-50 MG PO TABS
2.0000 | ORAL_TABLET | ORAL | Status: DC
  Administered 2017-03-14: 2 via ORAL
  Filled 2017-03-13: qty 2

## 2017-03-13 SURGICAL SUPPLY — 26 items
CLIP FILSHIE TUBAL LIGA STRL (Clip) ×3 IMPLANT
CLOTH BEACON ORANGE TIMEOUT ST (SAFETY) ×3 IMPLANT
CONTAINER PREFILL 10% NBF 15ML (MISCELLANEOUS) ×6 IMPLANT
DECANTER SPIKE VIAL GLASS SM (MISCELLANEOUS) ×3 IMPLANT
DRESSING OPSITE X SMALL 2X3 (GAUZE/BANDAGES/DRESSINGS) ×3 IMPLANT
DRSG OPSITE POSTOP 3X4 (GAUZE/BANDAGES/DRESSINGS) ×3 IMPLANT
DURAPREP 26ML APPLICATOR (WOUND CARE) ×3 IMPLANT
GLOVE BIOGEL PI IND STRL 7.0 (GLOVE) ×1 IMPLANT
GLOVE BIOGEL PI IND STRL 9 (GLOVE) ×2 IMPLANT
GLOVE BIOGEL PI INDICATOR 7.0 (GLOVE) ×2
GLOVE BIOGEL PI INDICATOR 9 (GLOVE) ×4
GLOVE ECLIPSE 9.0 STRL (GLOVE) ×3 IMPLANT
GOWN STRL REUS W/TWL 2XL LVL3 (GOWN DISPOSABLE) ×6 IMPLANT
GOWN STRL REUS W/TWL LRG LVL3 (GOWN DISPOSABLE) ×3 IMPLANT
NEEDLE HYPO 22GX1.5 SAFETY (NEEDLE) ×3 IMPLANT
NEEDLE HYPO 25X1 1.5 SAFETY (NEEDLE) ×3 IMPLANT
NS IRRIG 1000ML POUR BTL (IV SOLUTION) ×3 IMPLANT
PACK ABDOMINAL MINOR (CUSTOM PROCEDURE TRAY) ×3 IMPLANT
PROTECTOR NERVE ULNAR (MISCELLANEOUS) ×3 IMPLANT
SPONGE LAP 4X18 X RAY DECT (DISPOSABLE) IMPLANT
SUT VIC AB 0 CT1 27 (SUTURE)
SUT VIC AB 0 CT1 27XBRD ANBCTR (SUTURE) IMPLANT
SUT VIC AB 4-0 PS2 27 (SUTURE) ×3 IMPLANT
SYR CONTROL 10ML LL (SYRINGE) ×6 IMPLANT
TOWEL OR 17X24 6PK STRL BLUE (TOWEL DISPOSABLE) ×6 IMPLANT
TRAY FOLEY CATH SILVER 14FR (SET/KITS/TRAYS/PACK) IMPLANT

## 2017-03-13 NOTE — Progress Notes (Signed)
The patient has been counseled over the requested procedure of tubal ligation for permanent sterilization, and has been given information about Filshie clips versus partial salpingectomy as postpartum procedures. Pt is aware this is elective procedure, and she confirms signing tubal papers during pregnancy care. Consent in chart. Questions encouraged and answered to pt apparent satisfaction.  Epidural in place.  Pt to OR at this time.

## 2017-03-13 NOTE — Plan of Care (Signed)
General knowledge obtained

## 2017-03-13 NOTE — Addendum Note (Signed)
Addendum  created 03/13/17 1802 by Rica Recordsickelton, Shadrick Senne, CRNA   Sign clinical note

## 2017-03-13 NOTE — Op Note (Signed)
Please see the brief operative note for surgical details 

## 2017-03-13 NOTE — Anesthesia Preprocedure Evaluation (Signed)
Anesthesia Evaluation  Patient identified by MRN, date of birth, ID band Patient awake    Reviewed: Allergy & Precautions, H&P , Patient's Chart, lab work & pertinent test results, reviewed documented beta blocker date and time   Airway Mallampati: II  TM Distance: >3 FB Neck ROM: full    Dental no notable dental hx.    Pulmonary asthma , former smoker,    Pulmonary exam normal breath sounds clear to auscultation       Cardiovascular Exercise Tolerance: Good hypertension,  Rhythm:regular Rate:Normal     Neuro/Psych    GI/Hepatic   Endo/Other    Renal/GU      Musculoskeletal   Abdominal   Peds  Hematology   Anesthesia Other Findings   Reproductive/Obstetrics                             Anesthesia Physical  Anesthesia Plan  ASA: II  Anesthesia Plan: Epidural   Post-op Pain Management:    Induction:   PONV Risk Score and Plan:   Airway Management Planned:   Additional Equipment:   Intra-op Plan:   Post-operative Plan:   Informed Consent: I have reviewed the patients History and Physical, chart, labs and discussed the procedure including the risks, benefits and alternatives for the proposed anesthesia with the patient or authorized representative who has indicated his/her understanding and acceptance.   Dental Advisory Given  Plan Discussed with: CRNA and Surgeon  Anesthesia Plan Comments: (Labs checked- platelets confirmed with RN in room. Fetal heart tracing, per RN, reported to be stable enough for sitting procedure. Discussed epidural, and patient consents to the procedure:  included risk of possible headache,backache, failed block, allergic reaction, and nerve injury. This patient was asked if she had any questions or concerns before the procedure started.)        Anesthesia Quick Evaluation

## 2017-03-13 NOTE — Anesthesia Postprocedure Evaluation (Signed)
Anesthesia Post Note  Patient: Olivia Faulkner  Procedure(s) Performed: POST PARTUM TUBAL LIGATION (Bilateral )     Patient location during evaluation: Mother Baby Anesthesia Type: Epidural Level of consciousness: awake and alert Pain management: pain level controlled Vital Signs Assessment: post-procedure vital signs reviewed and stable Respiratory status: spontaneous breathing, nonlabored ventilation and respiratory function stable Cardiovascular status: stable Postop Assessment: no headache, no backache, epidural receding and patient able to bend at knees Anesthetic complications: no    Last Vitals:  Vitals:   03/13/17 1200 03/13/17 1315  BP: 118/61 118/62  Pulse: 70 67  Resp: 16 16  Temp: 36.9 C 37.1 C  SpO2: 100% 99%    Last Pain:  Vitals:   03/13/17 1730  TempSrc:   PainSc: 4    Pain Goal: Patients Stated Pain Goal: 2 (03/13/17 1730)               Rica RecordsICKELTON,Olga Seyler

## 2017-03-13 NOTE — Lactation Note (Signed)
This note was copied from a baby's chart. Lactation Consultation Note  Patient Name: Olivia Faulkner Noecker ZOXWR'UToday's Date: 03/13/2017 Reason for consult: Initial assessment Infant is 3627 hours old & seen by Lactation for Initial Assessment. Baby was born at 39w and weighed 7 lbs 2.3 oz at birth. Mom had baby lat[facility] to right breast [facility]. Mom reports baby had not BF well previously but has been doing better this feeding and the one before. Baby was suckling and mom reported a strong tug (LC did not see initial latch). Baby needed stimulation occ to encourage her to continue suckling. Mom reports she only BF for 1.5 months with her son but stopped because she had low supply. Mom plans to BF longer with this baby. After baby self-unlatched, baby continued to show feeding cues so mom tried latching her to her left breast in cradle hold. Mom reports she has been having a more difficult time latching to her left breast. Baby opened her mouth but then would not latch when mom's nipple was in her mouth. Mom's left nipple is larger than her right. Suggested mom try cross-cradle hold but baby was doing the same thing; mom then tried football hold and baby continued same pattern. Baby acted content and did not act interested in latching. Encouraged mom to continue working on that breast. Mom encouraged to feed baby 8-12 times/24 hours and with feeding cues.  Provided mom with BF booklet, BF resources, and feeding log; mom made aware of O/P services, breastfeeding support groups, community resources, and our phone # for post-discharge questions.  Discussed milk volume & hand expression. Mom reported she feels comfortable with how to hand express. Mom plans to stay at home with both children. Provided mom with a hand pump for occasional use- showed her how to use & clean it. Mom reports no questions. Encouraged mom to ask for help as needed.   Maternal Data Does the patient have breastfeeding  experience prior to this delivery?: Yes  Feeding Feeding Type: Breast Fed Length of feed: 25 min  LATCH Score                   Interventions Interventions: Breast feeding basics reviewed;Assisted with latch;Breast massage;Hand express;Adjust position;Support pillows;Position options;Hand pump  Lactation Tools Discussed/Used     Consult Status Consult Status: Follow-up Date: 03/14/17 Follow-up type: In-patient    Oneal GroutLaura C Katana Berthold 03/13/2017, 10:02 PM

## 2017-03-13 NOTE — Anesthesia Postprocedure Evaluation (Signed)
Anesthesia Post Note  Patient: Olivia Faulkner  Procedure(s) Performed: POST PARTUM TUBAL LIGATION (Bilateral )     Patient location during evaluation: Mother Baby Anesthesia Type: Epidural Level of consciousness: awake and alert and oriented Pain management: satisfactory to patient Vital Signs Assessment: post-procedure vital signs reviewed and stable Respiratory status: respiratory function stable Cardiovascular status: stable Postop Assessment: no headache, no backache, epidural receding, patient able to bend at knees, no signs of nausea or vomiting and adequate PO intake Anesthetic complications: no    Last Vitals:  Vitals:   03/13/17 0100 03/13/17 0700  BP: 126/66 122/60  Pulse: 68 (!) 54  Resp: 18 18  Temp: 36.7 C 36.8 C  SpO2: 99% 99%    Last Pain:  Vitals:   03/13/17 0350  TempSrc:   PainSc: 5    Pain Goal:                 Glenice Ciccone

## 2017-03-13 NOTE — Transfer of Care (Signed)
Immediate Anesthesia Transfer of Care Note  Patient: Olivia DeedJulie A Pfefferle  Procedure(s) Performed: POST PARTUM TUBAL LIGATION (Bilateral )  Patient Location: PACU  Anesthesia Type:Epidural  Level of Consciousness: awake, alert  and oriented  Airway & Oxygen Therapy: Patient Spontanous Breathing  Post-op Assessment: Report given to RN and Post -op Vital signs reviewed and stable  Post vital signs: Reviewed and stable  Last Vitals:  Vitals:   03/13/17 0700 03/13/17 1055  BP: 122/60 (!) 109/54  Pulse: (!) 54 64  Resp: 18 (!) 8  Temp: 36.8 C 36.8 C  SpO2: 99% 99%    Last Pain:  Vitals:   03/13/17 0828  TempSrc:   PainSc: 0-No pain         Complications: No apparent anesthesia complications

## 2017-03-13 NOTE — Clinical Social Work Maternal (Signed)
  CLINICAL SOCIAL WORK MATERNAL/CHILD NOTE  Patient Details  Name: Olivia Faulkner MRN: [id number] Date of Birth: 29-Aug-1985  Date:  03/13/2017  Clinical Social Worker Initiating Note:  Olivia Faulkner Date/Time: Initiated:  03/13/17/1300     Child's Name:  Olivia Faulkner    Biological Parents:  Father, Mother   Need for Interpreter:  None   Reason for Referral:  Current Substance Use/Substance Use During Pregnancy    Address:  [address]  [place] [address]    Phone number:  [phone] (home)     Additional phone number: [phone]  Household Members/Support Persons (HM/SP):   Household Member/Support Person 1   HM/SP Name Relationship DOB or Age  HM/SP -[place] - father of the baby 46ish  HM/SP -2        HM/SP -3        HM/SP -4        HM/SP -5        HM/SP -6        HM/SP -7        HM/SP -8          Natural Supports (not living in the home):  Parent, Friends   Professional Supports: Other (Comment)([employer])   Employment: Unemployed   Type of Work:     Education:  Programmer, systems   Homebound arranged:    Museum/gallery curator Resources:  Medicaid   Other Resources:      Cultural/Religious Considerations Which May Impact Care:  None  Strengths:  Ability to meet basic needs    Psychotropic Medications:         Pediatrician:       Pediatrician List:   [place]      Pediatrician Fax Number:    Risk Factors/Current Problems:  Substance Use    Cognitive State:  Goal Oriented , Insightful    Mood/Affect:  Calm , Comfortable , Happy , Interested    CSW Assessment: Clinical Education officer, museum met with MOB and FOB at bedside to offer support.  MOB states that FOB can remain in the room for assessment.  MOB admits to intermittent Marijuana use during pregnancy in order to help with morning sickness.  MOB also states that she  goes once a month to Suboxone clinic in [place].  MOB has been receiving treatment for almost three years.  MOB verbalized that she had a long history of pain pill addiction and the Suboxone treatment has helped her to remain sober for three years.  MOB is aware that a drug screen will be completed on the baby and cord blood will be tested to verify any substances in the baby's system.  MOB understanding and agreeable.  CSW to follow up with [facility] CPS once cord blood results are available and concerns are present.  Please consult CSW if current concerns arise or by MOB's request.  CSW will monitor CDS results and make report to Child Protective Services if warranted.   CSW Plan/Description:  Bracey, CSW Will Continue to Monitor Umbilical Cord Tissue Drug Screen Results and Make Report if Olivia Faulkner, [name]

## 2017-03-13 NOTE — Progress Notes (Signed)
POSTPARTUM PROGRESS NOTE  Post Partum Day 1 Subjective:  Olivia Faulkner is a 31 y.o. [id number] 287w0d s/p SVD.  No acute events overnight.  Pt denies problems with ambulating, voiding or po intake.  She denies nausea or vomiting.  Pain is moderately controlled.  She has had flatus. She has not had bowel movement.  Lochia Minimal.   She notes that she takes subutex at home on a schedule of 8/8/4 instead of the 8/12 that we have on orders currently.   She is calm and politely requests that we use her home schedule if possible.  Objective: Blood pressure 126/66, pulse 68, temperature 98.1 F (36.7 C), resp. rate 18, height 5\' 3"  (1.6 m), weight 62.1 kg (137 lb), last menstrual period 06/01/2016, SpO2 99 %, unknown if currently breastfeeding.  Physical Exam:  General: alert, cooperative and no distress Lochia:normal flow Chest: no respiratory distress Heart:regular rate, distal pulses intact Abdomen: soft, nontender,  Uterine Fundus: firm, appropriately tender DVT Evaluation: No calf swelling or tenderness Extremities: no edema  Recent Labs    03/12/17 0820  HGB 11.3*  HCT 33.3*    Assessment/Plan:  ASSESSMENT: Olivia Faulkner is a 31 y.o. [id number] 877w0d s/p svd PPD#1.  BTL scheduled for today (12/8), will assess d/c schedule after.   LOS: 1 day   Ardith Lewman BlandDO 03/13/2017, 6:32 AM

## 2017-03-13 NOTE — Brief Op Note (Signed)
03/13/2017  11:01 AM  PATIENT:  Olivia Faulkner  31 y.o. female  PRE-OPERATIVE DIAGNOSIS:  post portum tubal ligation  POST-OPERATIVE DIAGNOSIS:  post portum tubal ligation, by Filshie clip placement  PROCEDURE:  Procedure(s): POST PARTUM TUBAL LIGATION (Bilateral)  SURGEON:  Surgeon(s) and Role:    Tilda Burrow* Jasira Robinson V, MD - Primary  PHYSICIAN ASSISTANT:   ASSISTANTS: none   ANESTHESIA:   local and epidural  EBL:  5 mL   BLOOD ADMINISTERED:none  DRAINS: none   LOCAL MEDICATIONS USED:  MARCAINE    and Amount: 11 ml  SPECIMEN:  No Specimen  DISPOSITION OF SPECIMEN:  PATHOLOGY  COUNTS:  YES  TOURNIQUET:  * No tourniquets in log *  DICTATION: .Dragon Dictation  PLAN OF CARE: Patient has admission orders  PATIENT DISPOSITION:  PACU - hemodynamically stable.   Delay start of Pharmacological VTE agent (>24hrs) due to surgical blood loss or risk of bleeding: not applicable Details of procedure: Patient was taken the operating room prepped and draped for periumbilical procedure with Foley catheter in place, abdomen prepped and draped timeout conducted and confirmed by surgical team consent was reviewed and the report patient previously signed Medicaid tubal sterilization forms An infraumbilical semicircular 1 cm skin incision was made, with sharp dissection to the fascia which was opened transversely, and peritoneal cavity entered easily.  Attention was directed to the right fallopian tube which was identified and traced to its fimbriated end, and the midportion of the tube was elevated between 2 Babcock clamps, and Filshie clip applied without difficulty.  Positioning and security of the clip was confirmed.  The left tube was then treated in similar fashion.  We then, subcuticular 4-0 Vicryl closure of the skin and Band-Aids and Steri-Strips in place.  Patient to recovery room in stable condition.  She will be observed for 4 hours and is desirous to go home may discharge at that  time

## 2017-03-14 ENCOUNTER — Encounter (HOSPITAL_COMMUNITY): Payer: Self-pay | Admitting: Obstetrics and Gynecology

## 2017-03-14 MED ORDER — BUPRENORPHINE HCL-NALOXONE HCL 8-2 MG SL SUBL: 1.0000 | SUBLINGUAL_TABLET | Freq: Every day | SUBLINGUAL | Status: DC

## 2017-03-14 MED ORDER — BUPRENORPHINE HCL 8 MG SL SUBL
8.0000 mg | SUBLINGUAL_TABLET | Freq: Every day | SUBLINGUAL | Status: DC
  Filled 2017-03-14 (×2): qty 1

## 2017-03-14 NOTE — Discharge Summary (Signed)
OB Discharge Summary     Patient Name: Olivia Faulkner DOB: 1985-07-03 MRN: [id number]  Date of admission: 03/12/2017 Delivering MD: Sharyon CableOGERS, VERONICA C   Date of discharge: 03/14/2017  Admitting diagnosis: INDUCTION Intrauterine pregnancy: 6671w0d     Secondary diagnosis:  Active Problems:   Encounter for induction of labor  Additional problems: BTL     Discharge diagnosis: Term Pregnancy Delivered                                                                                                Post partum procedures:postpartum tubal ligation  Augmentation: Pitocin  Complications: None  Hospital course:  Induction of Labor With Vaginal Delivery   31 y.o. yo [id number] at 2371w0d was admitted to the hospital 03/12/2017 for induction of labor.  Indication for induction: single artery cord.  Patient had an uncomplicated labor course as follows: Membrane Rupture Time/Date: 4:56 PM ,03/12/2017   Intrapartum Procedures: Episiotomy: None [1]                                         Lacerations:  None [1]  Patient had delivery of a Viable infant.  Information for the patient's newborn:  Leane CallCogdill, Girl Raynelle FanningJulie [[id number]][[id number]]  Delivery Method: Vaginal, Spontaneous(Filed from Delivery Summary)   03/12/2017  Details of delivery can be found in separate delivery note.  Patient had a routine postpartum course. Patient is discharged home 03/14/17.  Physical exam  Vitals:   03/13/17 1315 03/13/17 2114 03/14/17 0137 03/14/17 0504  BP: 118/62 (!) 109/57 (!) 108/52 115/65  Pulse: 67 75 62 (!) 50  Resp: 16 16 16 16   Temp: 98.7 F (37.1 C) 98 F (36.7 C) 98.2 F (36.8 C) 98.2 F (36.8 C)  TempSrc: Oral Oral Oral Oral  SpO2: 99%     Weight:      Height:       General: alert Lochia: appropriate Uterine Fundus: firm Incision: Healing well with no significant drainage DVT Evaluation: No evidence of DVT seen on physical exam. Labs: Lab Results  Component Value Date   WBC 10.5 03/12/2017   HGB 11.3  (L) 03/12/2017   HCT 33.3 (L) 03/12/2017   MCV 93.0 03/12/2017   PLT 198 03/12/2017   CMP Latest Ref Rng & Units 05/30/2015  Glucose 65 - 99 mg/dL 87  BUN 6 - 20 mg/dL 6  Creatinine 1.020.44 - 7.251.00 mg/dL 3.660.57  Sodium 440135 - 347145 mmol/L 138  Potassium 3.5 - 5.1 mmol/L 3.9  Chloride 101 - 111 mmol/L 107  CO2 22 - 32 mmol/L 22  Calcium 8.9 - 10.3 mg/dL 8.9  Total Protein 6.5 - 8.1 g/dL 6.6  Total Bilirubin 0.3 - 1.2 mg/dL 0.6  Alkaline Phos 38 - 126 U/L 230(H)  AST 15 - 41 U/L 23  ALT 14 - 54 U/L 10(L)    Discharge instruction: per After Visit Summary and "Baby and Me Booklet".  After visit meds:  Allergies as of 03/14/2017  Reactions   Sulfa Antibiotics Hives, Itching      Medication List    TAKE these medications   diphenhydramine-acetaminophen 25-500 MG Tabs tablet Commonly known as:  TYLENOL PM Take 1 tablet by mouth at bedtime as needed (sleep).   Doxylamine-Pyridoxine 10-10 MG Tbec Take 1 tablet at bedtime by mouth.   ondansetron 4 MG disintegrating tablet Commonly known as:  ZOFRAN ODT Take 1 tablet (4 mg total) by mouth every 6 (six) hours as needed for nausea.   PRENATAL VITAMINS PLUS 27-1 MG Tabs Take 1 tablet by mouth daily.   PROVENTIL HFA 108 (90 Base) MCG/ACT inhaler Generic drug:  albuterol INHALE ONE TO TWO PUFFS INTO LUNGS EVERY 6 HOURS AS NEEDED FOR WHEEZING FOR SHORTNESS OF BREATH   ranitidine 150 MG tablet Commonly known as:  ZANTAC Take 150 mg by mouth 2 (two) times daily.   SUBOXONE 8-2 MG Film Generic drug:  Buprenorphine HCl-Naloxone HCl Place under the tongue. 1 film in the morning and 1 and 1/2 film in the evening   triamcinolone cream 0.5 % Commonly known as:  KENALOG Apply 1 application topically 3 (three) times daily.       Diet: routine diet  Activity: Advance as tolerated. Pelvic rest for 6 weeks.   Outpatient follow up:6 weeks, please make appt. Follow up Appt:No future appointments. Follow up Visit:No Follow-up on  file.  Postpartum contraception: Tubal Ligation  Newborn Data: Live born female  Birth Weight: 7 lb 2.3 oz (3240 g) APGAR: 9, 9  Newborn Delivery   Birth date/time:  03/12/2017 17:57:00 Delivery type:  Vaginal, Spontaneous     Baby Feeding: Breast Disposition:home with mother   03/14/2017 Marthenia RollingScott Iysis Germain, DO

## 2017-03-14 NOTE — Anesthesia Postprocedure Evaluation (Signed)
Anesthesia Post Note  Patient: Olivia Faulkner  Procedure(s) Performed: AN AD HOC LABOR EPIDURAL     Patient location during evaluation: Mother Baby Anesthesia Type: Epidural Level of consciousness: awake Pain management: pain level controlled Vital Signs Assessment: post-procedure vital signs reviewed and stable Respiratory status: spontaneous breathing Cardiovascular status: stable Postop Assessment: no headache, no backache, epidural receding, patient able to bend at knees and no apparent nausea or vomiting Anesthetic complications: no    Last Vitals:  Vitals:   03/14/17 0137 03/14/17 0504  BP: (!) 108/52 115/65  Pulse: 62 (!) 50  Resp: 16 16  Temp: 36.8 C 36.8 C  SpO2:      Last Pain:  Vitals:   03/14/17 0504  TempSrc: Oral  PainSc: 3    Pain Goal: Patients Stated Pain Goal: 2 (03/13/17 1730)               Jacquelina Hewins JR,JOHN Susann GivensFRANKLIN

## 2017-03-14 NOTE — Lactation Note (Signed)
This note was copied from a baby's chart. Lactation Consultation Note  Patient Name: Olivia Faulkner VHQIO'NToday's Date: 03/14/2017 Reason for consult: Follow-up assessment   Mother is taking suboxone so per mother baby will be staying until Wednesday. 7.3% weight loss but only 2.7% last night.  Infant's feedings have improved in quality, length and frequency. Baby 40 hours old.  Mother latched baby in cradle hold.  Demonstrated how to latch her first in cross cradle to achieve more depth. Reminded mother to flange upper lip. Sucks and swallows observed for 10 min.  Baby had recently bf before LC entered room. Mom encouraged to feed baby 8-12 times/24 hours and with feeding cues.  Encouraged mother to compress breast during feeding to keep baby active and bf on both breasts per feeding. Mother has not been pumping since baby has been doing better at the breast. Suggest if baby becomes sleepy and will not latch mother should start pumping w/ DEBP. Discussed supply and demand.  Mother hopes to breastfeed this child longer than the first.    Maternal Data    Feeding Feeding Type: Breast Fed Length of feed: 10 min  LATCH Score Latch: Grasps breast easily, tongue down, lips flanged, rhythmical sucking.  Audible Swallowing: A few with stimulation  Type of Nipple: Everted at rest and after stimulation  Comfort (Breast/Nipple): Soft / non-tender  Hold (Positioning): Assistance needed to correctly position infant at breast and maintain latch.  LATCH Score: 8  Interventions Interventions: Breast feeding basics reviewed  Lactation Tools Discussed/Used     Consult Status Consult Status: Follow-up Date: 03/15/17 Follow-up type: In-patient    Dahlia ByesBerkelhammer, Jemeka [facility] 03/14/2017, 10:31 AM

## 2017-03-15 ENCOUNTER — Ambulatory Visit: Payer: Self-pay

## 2017-03-15 NOTE — Lactation Note (Signed)
This note was copied from a baby's chart. Infant 4770 hours old, mother is taking Suboxone po, exclusively breastfeeding.   Mother reports baby is latching well. Denies nipple soreness. Breast filling on right side, left side soft. DEBP has been set up but mother has not used yet.   Mother reports attempted breastfeeding with last child but had poor milk supply and weaned early. She reports that she is very motivated to breastfeed. Instructed to pump following breastfeeding for additional stimulation.Reviewed pumping instructions. Several visitors present.   Baby was calm and alert at this time being held by family member.

## 2017-03-16 ENCOUNTER — Ambulatory Visit: Payer: Self-pay

## 2017-03-16 NOTE — Lactation Note (Signed)
This note was copied from a baby's chart. Lactation Consultation Note:  Infant is 5388 hours old and is at 10 % weight loss this am. Mother has been exclusively breastfeeding. She offered formula this am and infant took 35 ml.  Mother is active with WIC. She was sat up with a DEBP and has not pumped any breastmilk. Mother also has a harmony hand pump. She was advised to pump for 15-20 mins with electric or 15 mins on each breast with the manual pump.  Mother advised to continue to supplement infant with ebm/formula after each feeding.  Discussed milk storage guidelines on page [phone] in mother and baby book.  Mother to do good breast massage and breast compression to soften breast. Mothers breast are filling. Assist mother with hand expression and advised to hand express before and after each feeding. Mother receptive to plan of care.   Patient Name: Olivia Faulkner AVWUJ'WToday's Date: 03/16/2017 Reason for consult: Follow-up assessment   Maternal Data    Feeding Feeding Type: Breast Fed Length of feed: 40 min  LATCH Score                   Interventions Interventions: Hand express;Breast compression;Expressed milk;Hand pump  Lactation Tools Discussed/Used     Consult Status Consult Status: Follow-up Date: 03/17/17 Follow-up type: In-patient    Olivia Faulkner, Olivia Morelos [facility] 03/16/2017, 10:33 AM

## 2017-03-17 ENCOUNTER — Encounter (HOSPITAL_COMMUNITY): Payer: Self-pay

## 2017-04-16 ENCOUNTER — Ambulatory Visit: Payer: Medicaid Other | Admitting: Obstetrics & Gynecology

## 2018-12-08 IMAGING — US US MFM OB FOLLOW-UP
1 series · 14 of 28 positions shown · non-contrast
Comparison: none

[Series 1: us mfm ob follow-up · 41 acquisitions, 14 frames shown]
[im 2/41]
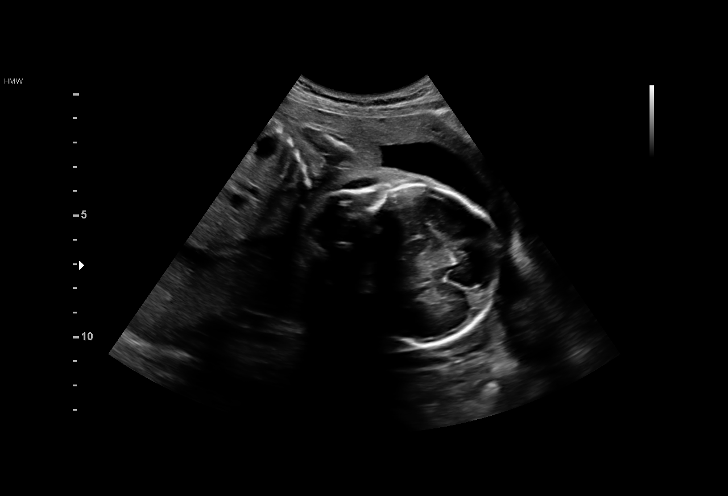
[im 5/41]
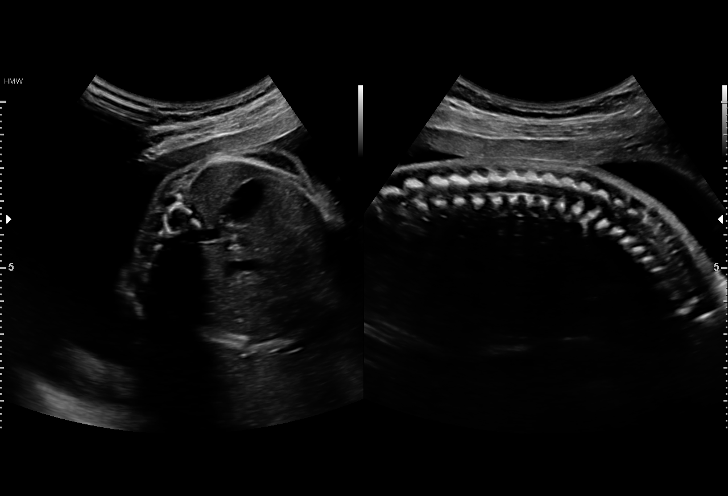
[im 8/41]
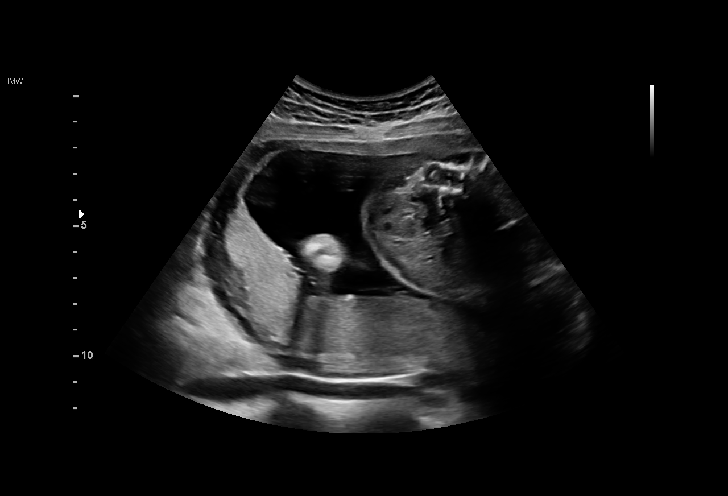
[im 11/41]
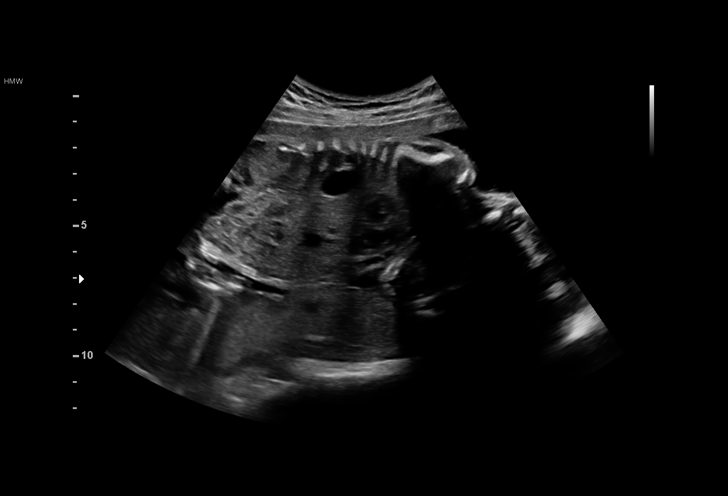
[im 14/41]
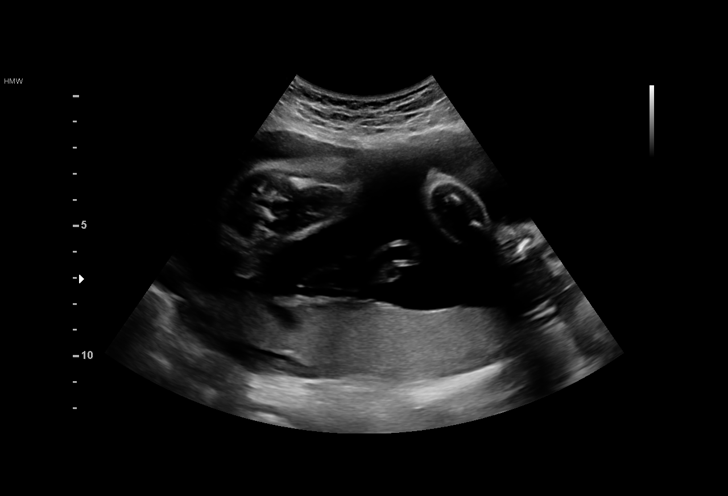
[im 17/41]
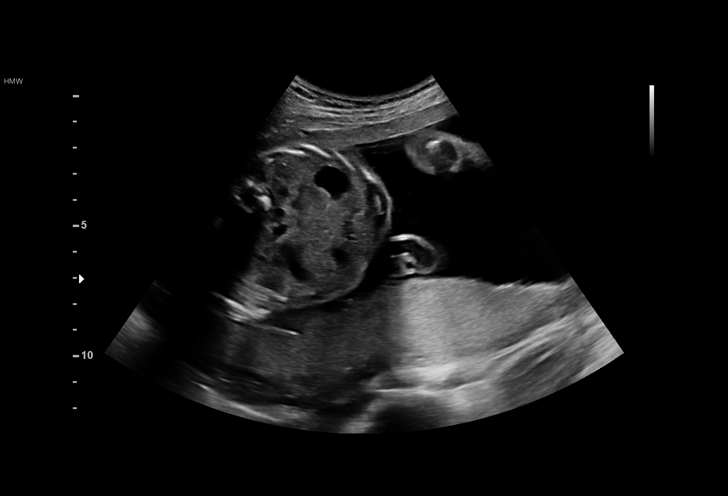
[im 20/41]
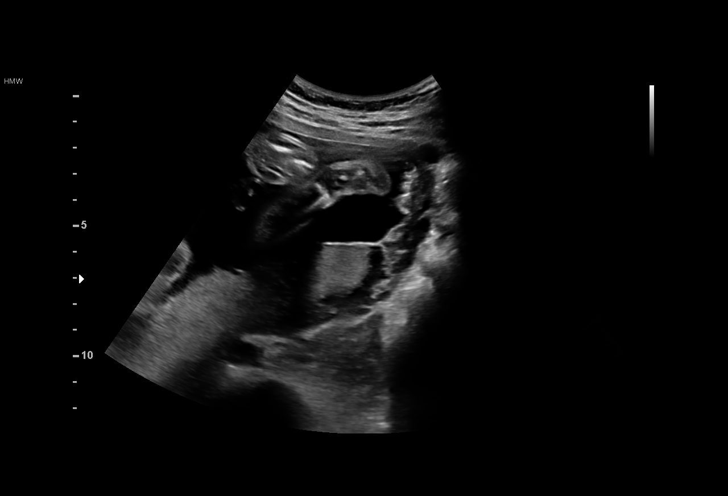
[im 23/41]
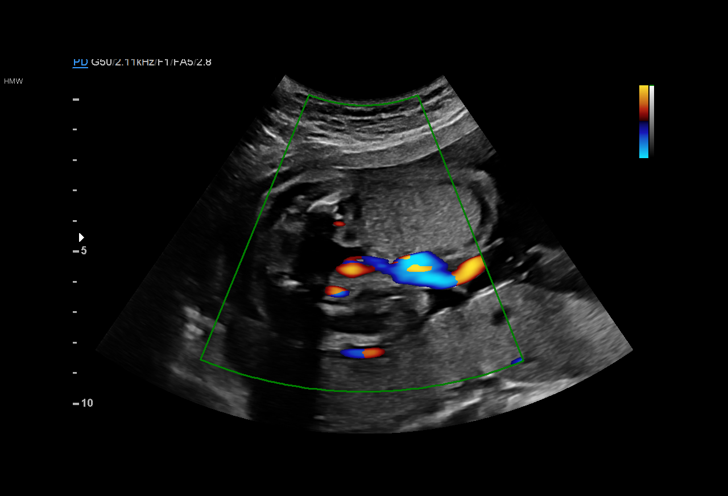
[im 26/41]
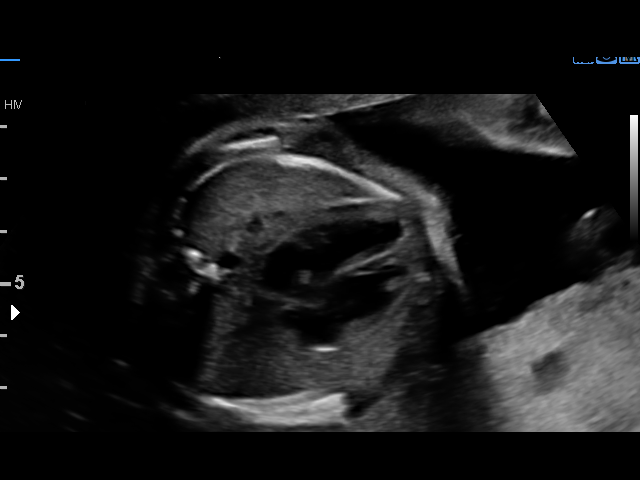
[im 29/41]
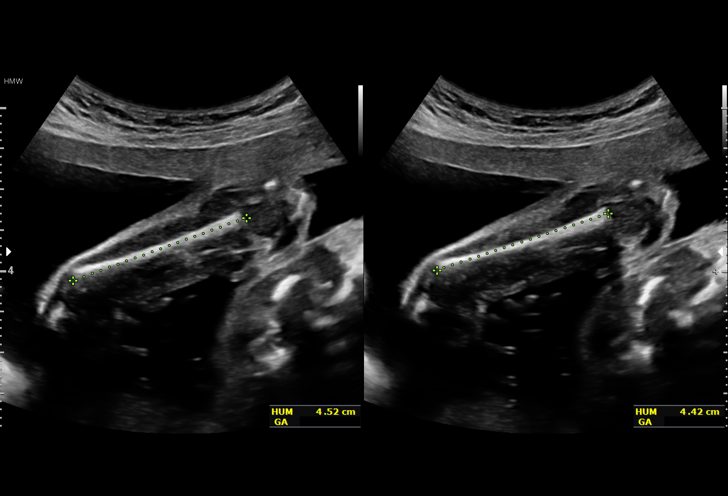
[im 32/41]
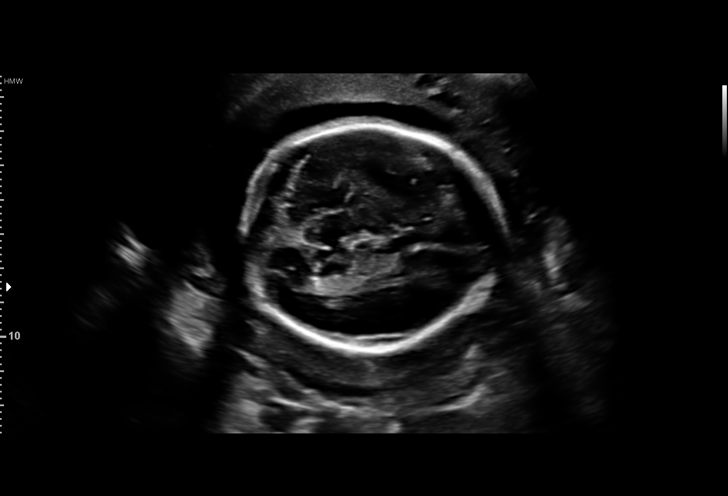
[im 35/41]
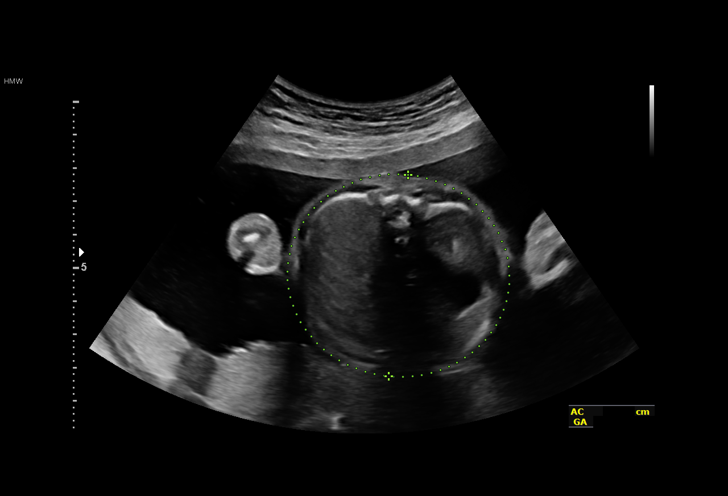
[im 38/41]
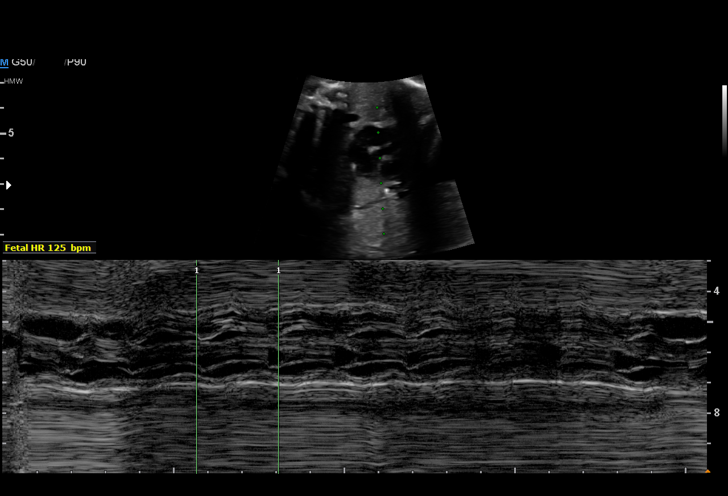
[im 41/41]
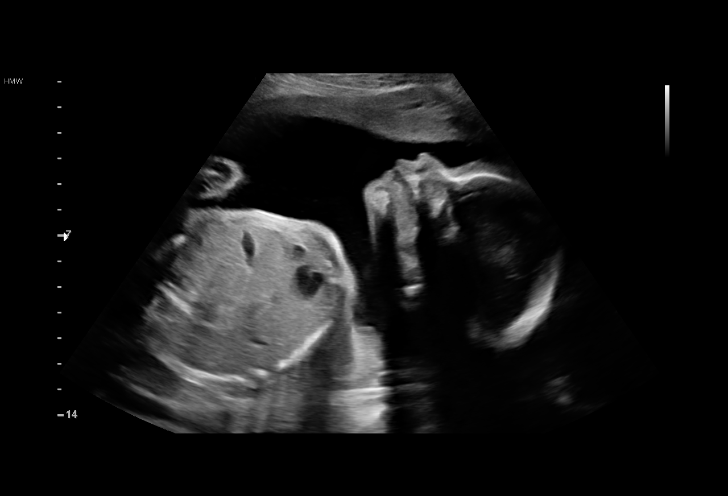

[14 of 28 positions shown; findings below may reference images not displayed]

Name:       MINO HALLIDAY                Visit Date:  12/02/2016 [DATE]

for [REDACTED]care

NINETYTHREE
Indications

24 weeks gestation of pregnancy
Drug dependence complicating pregnancy,
antepartum condition or complication
(Suboxone)
Other mental disorder complicating
pregnancy, first timester (Anxiety and
Depression)
Encounter for other antenatal screening
follow-up
OB History

Blood Type:            Height:  5'3"   Weight (lb):  112      BMI:
Gravidity:    3         Term:   1        Prem:   0        SAB:   1
TOP:          0       Ectopic:  0        Living: 1
Fetal Evaluation

Num Of Fetuses:     1
Fetal Heart         125
Rate(bpm):
Cardiac Activity:   Observed
Presentation:       Cephalic
Placenta:           Posterior Fundal, above cervical os
P. Cord Insertion:  Previously Visualized
Amniotic Fluid
AFI FV:      Subjectively within normal limits

Largest Pocket(cm)
5.6
Biometry

BPD:      63.5  mm     G. Age:  25w 5d         77  %    CI:         78.5   %   70 - 86
FL/HC:      19.8   %   18.7 -
HC:      226.7  mm     G. Age:  24w 5d         30  %    HC/AC:      1.13       1.04 -
AC:      199.8  mm     G. Age:  24w 4d         39  %    FL/BPD:     70.7   %   71 - 87
FL:       44.9  mm     G. Age:  24w 6d         41  %    FL/AC:      22.5   %   20 - 24
HUM:      44.7  mm     G. Age:  26w 4d         87  %

Est. FW:     731  gm    1 lb 10 oz      53  %
Gestational Age

LMP:           26w 2d       Date:   06/01/16                 EDD:   03/08/17
U/S Today:     25w 0d                                        EDD:   03/17/17
Best:          24w 5d    Det. By:   Early Ultrasound         EDD:   03/19/17
(08/14/16)
Anatomy

Cranium:               Appears normal         Aortic Arch:            Previously seen
Cavum:                 Previously seen        Ductal Arch:            Previously seen
Ventricles:            Appears normal         Diaphragm:              Previously seen
Choroid Plexus:        Previously seen        Stomach:                Appears normal, left
sided
Cerebellum:            Previously seen        Abdomen:                Previously seen
Posterior Fossa:       Previously seen        Abdominal Wall:         Previously seen
Nuchal Fold:           Previously seen        Cord Vessels:           2 vessel cord,
absent Hieronimus Kuka
Face:                  Orbits and profile     Kidneys:                Appear normal
previously seen
Lips:                  Previously seen        Bladder:                Appears normal
Thoracic:              Appears normal         Spine:                  Appears normal
Heart:                 Appears normal         Upper Extremities:      Previously seen
(4CH, axis, and
situs)
RVOT:                  Appears normal         Lower Extremities:      Previously seen
LVOT:                  Previously seen

Other:  Heels and 5th digit previously visualized. Nasal bone visualized
previously. Open hands previously visualized. Technically difficult due
to fetal position.
Cervix Uterus Adnexa

Cervix
Length:            3.1  cm.
Normal appearance by transabdominal scan.

Uterus
No abnormality visualized.

Left Ovary
No adnexal mass visualized.
Right Ovary
No adnexal mass visualized.

Cul De Sac:   No free fluid seen.

Adnexa:       No abnormality visualized.
Impression

SIUP at 24+5 weeks with single umbilical artery and subutex
use
Interval review of fetal anatomy appeared normal, with the
exception of the 2-vessel umbilical cord; all relevant anatomy
has been visualized
Normal amniotic fluid volume
Measurements show growth in the 53rd percentile

Recommendations

Repeat scan for growth in 6 weeks

## 2019-03-02 IMAGING — US US MFM FETAL BPP W/O NON-STRESS
1 series · 12 of 28 positions shown · non-contrast
Comparison: none

[Series 1: us mfm fetal bpp w/o non-stress · 36 acquisitions, 12 frames shown]
[im 2/36]
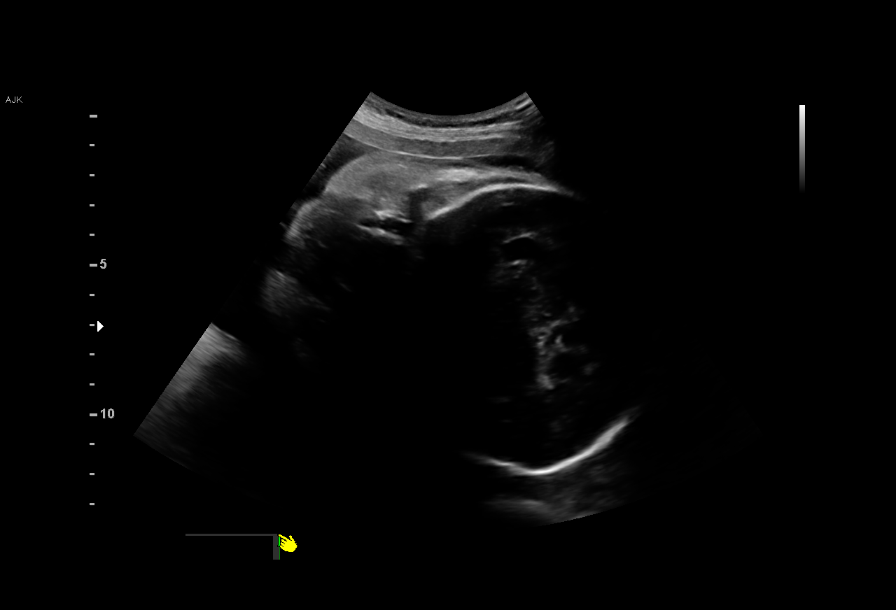
[im 4/36]
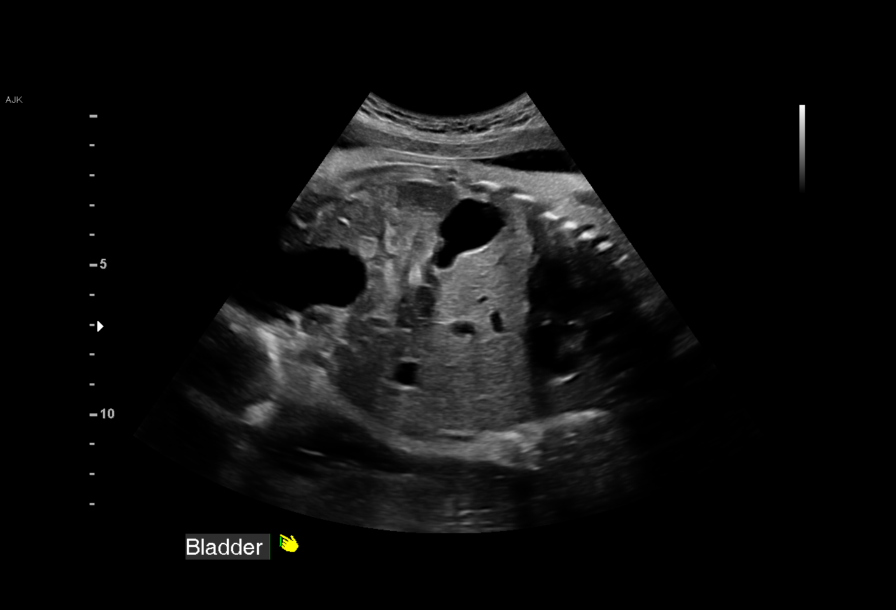
[im 7/36]
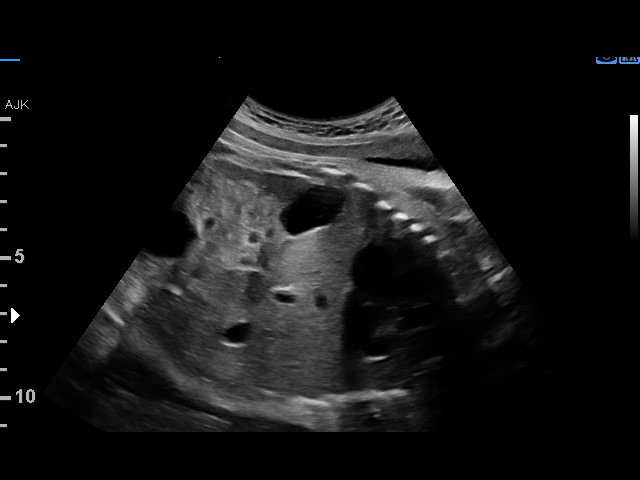
[im 11/36]
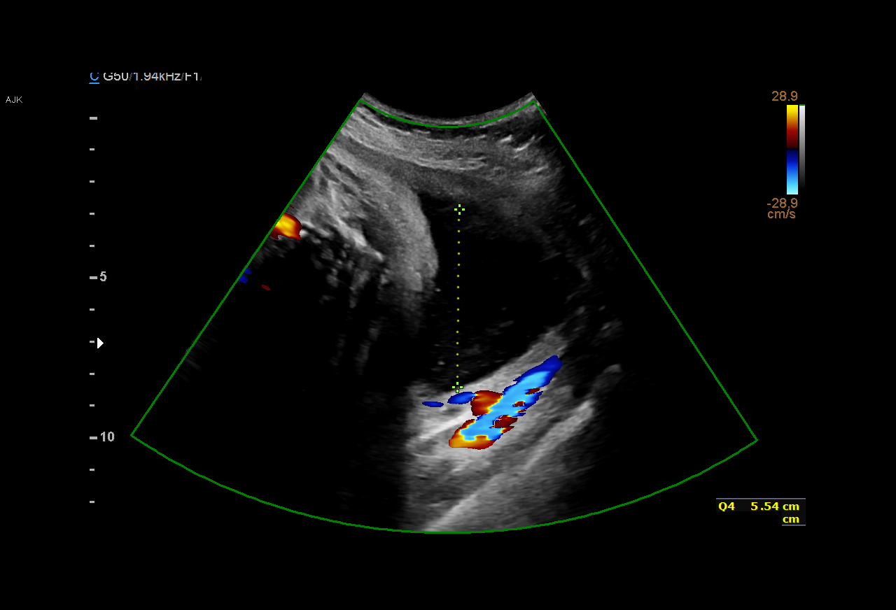
[im 13/36]
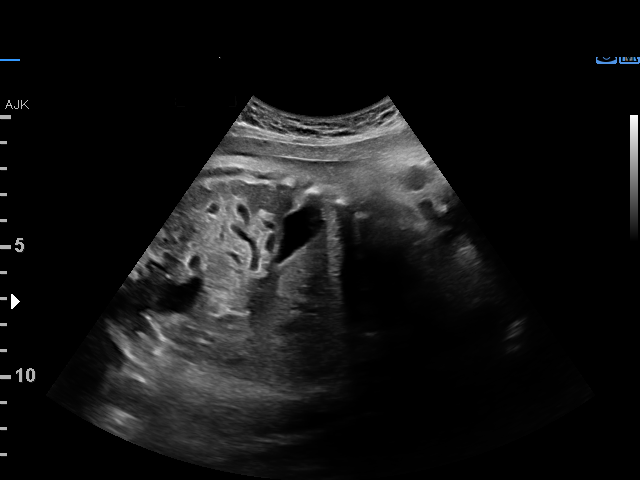
[im 16/36]
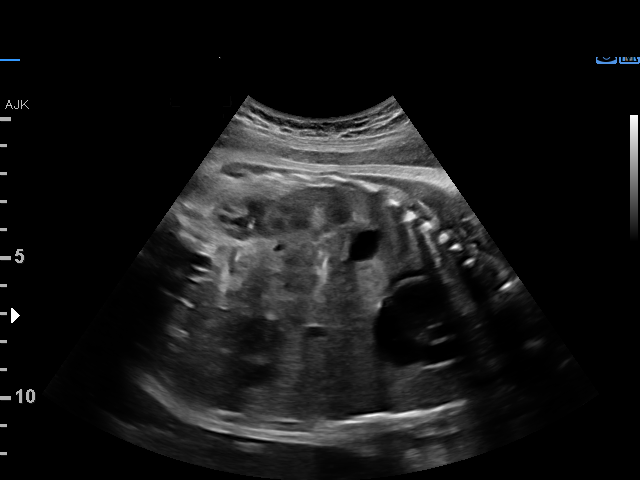
[im 20/36]
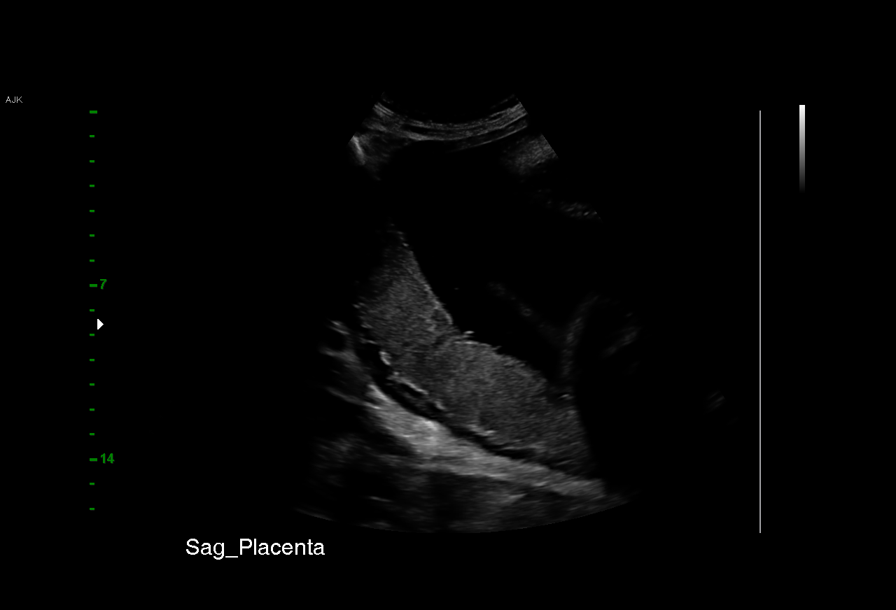
[im 23/36]
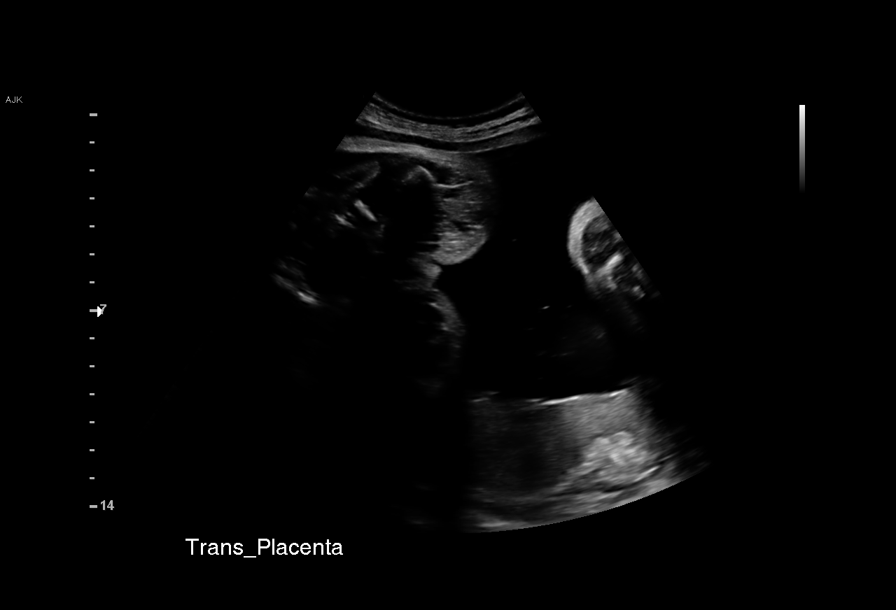
[im 25/36]
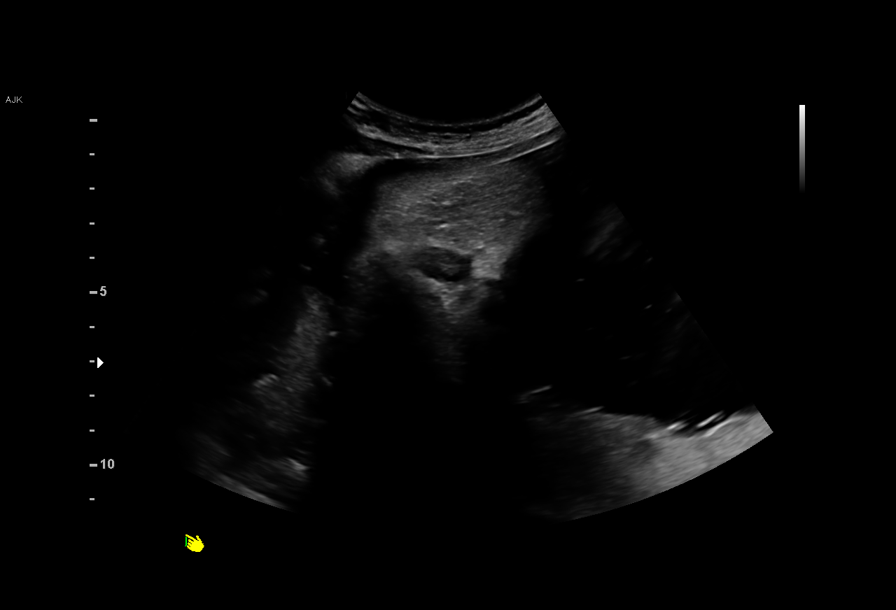
[im 29/36]
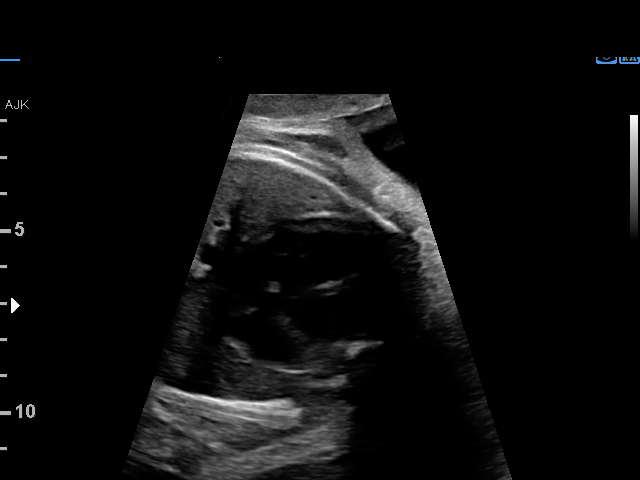
[im 32/36]
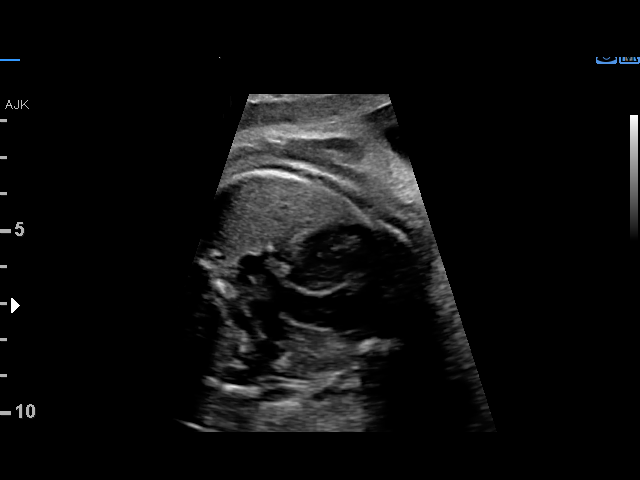
[im 34/36]
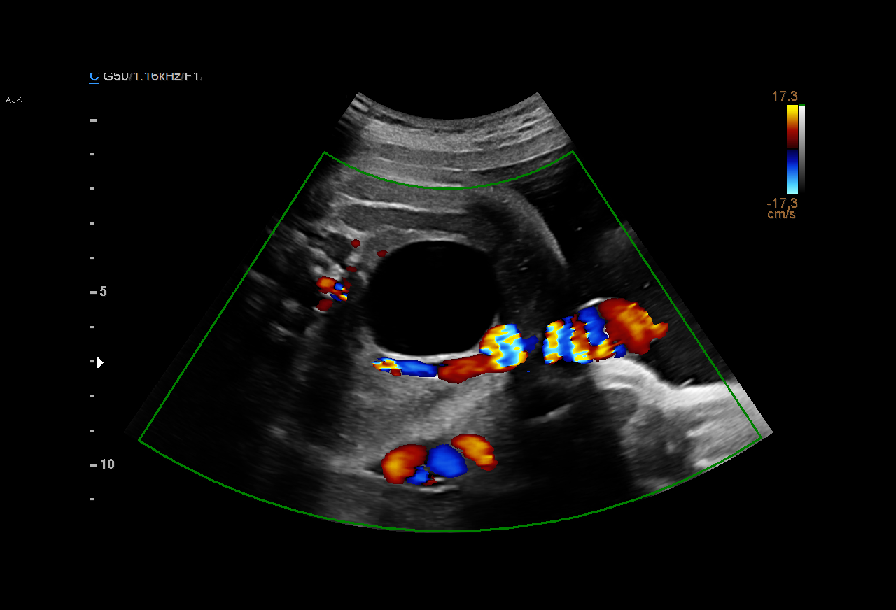

[12 of 28 positions shown; findings below may reference images not displayed]

for [REDACTED]care

1  TAS MELENDREZ            [id number]      [id number]     [id number]
Indications

36 weeks gestation of pregnancy
Drug dependence complicating pregnancy,
antepartum condition or complication
(Suboxone)
Other mental disorder complicating
pregnancy, first timester (Anxiety and
Depression)
Encounter for other antenatal screening
follow-up
2 vessel umbilical cord
OB History

Blood Type:            Height:  5'3"   Weight (lb):  112       BMI:
Gravidity:    3         Term:   1        Prem:   0         SAB:   1
TOP:          0       Ectopic:  0        Living: 1
Fetal Evaluation

Num Of Fetuses:     1
Fetal Heart         123
Rate(bpm):
Cardiac Activity:   Observed
Presentation:       Cephalic
Placenta:           Posterior, above cervical os
P. Cord Insertion:  Previously Visualized
Amniotic Fluid
AFI FV:      Mild polyhydramnios

AFI Sum(cm)     %Tile       Largest Pocket(cm)
24.62           95

RUQ(cm)       RLQ(cm)       LUQ(cm)        LLQ(cm)
8.01
Biophysical Evaluation

Amniotic F.V:   Polyhydramnios             F. Tone:         Observed
F. Movement:    Observed                   N.S.T:           Reactive
F. Breathing:   Not Observed               Score:           [DATE]
Gestational Age

LMP:           38w 2d        Date:  06/01/16                 EDD:    03/08/17
Best:          36w 5d     Det. By:  Early Ultrasound         EDD:    03/19/17
(08/14/16)
Impression

SIUP at 36+5 weeks
Cephalic presentation; single umbilical artery
Mild polyhydramnios
BPP [DATE] (-2 for breathing motion)
Recommendations

Continue weekly BPPs

## 2019-04-23 ENCOUNTER — Emergency Department (HOSPITAL_BASED_OUTPATIENT_CLINIC_OR_DEPARTMENT_OTHER)
Admission: EM | Admit: 2019-04-23 | Discharge: 2019-04-23 | Disposition: A | Discharge status: Home / Self Care | Payer: Medicaid Other | Attending: Emergency Medicine | Admitting: Emergency Medicine

## 2019-04-23 ENCOUNTER — Emergency Department (HOSPITAL_BASED_OUTPATIENT_CLINIC_OR_DEPARTMENT_OTHER): Payer: Medicaid Other

## 2019-04-23 ENCOUNTER — Encounter (HOSPITAL_BASED_OUTPATIENT_CLINIC_OR_DEPARTMENT_OTHER): Payer: Self-pay | Admitting: Emergency Medicine

## 2019-04-23 ENCOUNTER — Other Ambulatory Visit: Payer: Self-pay

## 2019-04-23 DIAGNOSIS — Z79899 Other long term (current) drug therapy: Secondary | ICD-10-CM | POA: Insufficient documentation

## 2019-04-23 DIAGNOSIS — K529 Noninfective gastroenteritis and colitis, unspecified: Secondary | ICD-10-CM | POA: Diagnosis not present

## 2019-04-23 DIAGNOSIS — F17210 Nicotine dependence, cigarettes, uncomplicated: Secondary | ICD-10-CM | POA: Insufficient documentation

## 2019-04-23 DIAGNOSIS — N898 Other specified noninflammatory disorders of vagina: Secondary | ICD-10-CM | POA: Diagnosis not present

## 2019-04-23 DIAGNOSIS — R1032 Left lower quadrant pain: Secondary | ICD-10-CM | POA: Diagnosis present

## 2019-04-23 DIAGNOSIS — J45909 Unspecified asthma, uncomplicated: Secondary | ICD-10-CM | POA: Insufficient documentation

## 2019-04-23 DIAGNOSIS — N200 Calculus of kidney: Secondary | ICD-10-CM | POA: Diagnosis not present

## 2019-04-23 LAB — URINALYSIS, ROUTINE W REFLEX MICROSCOPIC
Bilirubin Urine: NEGATIVE
Glucose, UA: NEGATIVE mg/dL
Glucose, UA: NEGATIVE mg/dL
Ketones, ur: 40 mg/dL — AB
Ketones, ur: NEGATIVE mg/dL
Leukocytes,Ua: NEGATIVE
Nitrite: NEGATIVE
Nitrite: NEGATIVE
Protein, ur: 30 mg/dL — AB
Protein, ur: NEGATIVE mg/dL
Specific Gravity, Urine: 1.015 (ref 1.005–1.030)
Specific Gravity, Urine: <1.005 — ABNORMAL LOW (ref 1.005–1.030)
pH: >9 — ABNORMAL HIGH (ref 5.0–8.0)
pH: 8 (ref 5.0–8.0)

## 2019-04-23 LAB — URINALYSIS, MICROSCOPIC (REFLEX): WBC, UA: NONE SEEN WBC/hpf (ref 0–5)

## 2019-04-23 LAB — COMPREHENSIVE METABOLIC PANEL
ALT: 20 U/L (ref 0–44)
AST: 23 U/L (ref 15–41)
Albumin: 5.1 g/dL — ABNORMAL HIGH (ref 3.5–5.0)
Alkaline Phosphatase: 75 U/L (ref 38–126)
Anion gap: 14 (ref 5–15)
BUN: 19 mg/dL (ref 6–20)
CO2: 24 mmol/L (ref 22–32)
Calcium: 10.2 mg/dL (ref 8.9–10.3)
Chloride: 103 mmol/L (ref 98–111)
Creatinine, Ser: 0.87 mg/dL (ref 0.44–1.00)
GFR calc Af Amer: >60 mL/min (ref >60)
GFR calc non Af Amer: >60 mL/min (ref >60)
Glucose, Bld: 129 mg/dL — ABNORMAL HIGH (ref 70–99)
Potassium: 3.4 mmol/L — ABNORMAL LOW (ref 3.5–5.1)
Sodium: 141 mmol/L (ref 135–145)
Total Bilirubin: 0.9 mg/dL (ref 0.3–1.2)
Total Protein: 8 g/dL (ref 6.5–8.1)

## 2019-04-23 LAB — CBC
HCT: 41.3 % (ref 36.0–46.0)
Hemoglobin: 14.1 g/dL (ref 12.0–15.0)
MCH: 29.1 pg (ref 26.0–34.0)
MCHC: 34.1 g/dL (ref 30.0–36.0)
MCV: 85.2 fL (ref 80.0–100.0)
Platelets: 325 10*3/uL (ref 150–400)
RBC: 4.85 MIL/uL (ref 3.87–5.11)
RDW: 13.9 % (ref 11.5–15.5)
WBC: 19.7 10*3/uL — ABNORMAL HIGH (ref 4.0–10.5)
nRBC: 0 % (ref 0.0–0.2)

## 2019-04-23 LAB — HIV ANTIBODY (ROUTINE TESTING W REFLEX): HIV Screen 4th Generation wRfx: NONREACTIVE

## 2019-04-23 LAB — WET PREP, GENITAL
Sperm: NONE SEEN
Trich, Wet Prep: NONE SEEN
Yeast Wet Prep HPF POC: NONE SEEN

## 2019-04-23 LAB — LACTIC ACID, PLASMA: Lactic Acid, Venous: 1.7 mmol/L (ref 0.5–1.9)

## 2019-04-23 LAB — PREGNANCY, URINE: Preg Test, Ur: NEGATIVE

## 2019-04-23 LAB — LIPASE, BLOOD: Lipase: 21 U/L (ref 11–51)

## 2019-04-23 MED ORDER — AMOXICILLIN-POT CLAVULANATE 875-125 MG PO TABS: 1.0000 | ORAL_TABLET | Freq: Two times a day (BID) | ORAL | 0 refills | Status: DC

## 2019-04-23 MED ORDER — ONDANSETRON HCL 4 MG PO TABS: 4.0000 mg | ORAL_TABLET | Freq: Three times a day (TID) | ORAL | 0 refills | Status: DC | PRN

## 2019-04-23 MED ORDER — IOHEXOL 300 MG/ML  SOLN
100.0000 mL | Freq: Once | INTRAMUSCULAR | Status: AC | PRN
  Administered 2019-04-23: 17:00:00 100 mL via INTRAVENOUS

## 2019-04-23 MED ORDER — DICYCLOMINE HCL 20 MG PO TABS: 20.0000 mg | ORAL_TABLET | Freq: Two times a day (BID) | ORAL | 0 refills | Status: DC

## 2019-04-23 MED ORDER — SODIUM CHLORIDE 0.9 % IV SOLN
1.0000 g | Freq: Once | INTRAVENOUS | Status: AC
  Administered 2019-04-23: 19:00:00 1 g via INTRAVENOUS
  Filled 2019-04-23: qty 10

## 2019-04-23 MED ORDER — DICYCLOMINE HCL 10 MG PO CAPS
10.0000 mg | ORAL_CAPSULE | Freq: Once | ORAL | Status: AC
  Administered 2019-04-23: 20:00:00 10 mg via ORAL
  Filled 2019-04-23: qty 1

## 2019-04-23 MED ORDER — ACETAMINOPHEN 500 MG PO TABS
1000.0000 mg | ORAL_TABLET | Freq: Once | ORAL | Status: AC
  Administered 2019-04-23: 17:00:00 1000 mg via ORAL
  Filled 2019-04-23: qty 2

## 2019-04-23 MED ORDER — SODIUM CHLORIDE 0.9 % IV BOLUS
500.0000 mL | Freq: Once | INTRAVENOUS | Status: AC
  Administered 2019-04-23: 500 mL via INTRAVENOUS

## 2019-04-23 NOTE — ED Provider Notes (Signed)
[facility] EMERGENCY DEPARTMENT Provider Note   CSN: [id number] Arrival date & time: 04/23/19  1404     History Chief Complaint  Patient presents with  . Abdominal Pain    Olivia Faulkner is a 34 y.o. female presenting for evaluation of abdominal pain and constipation.  Patient states she woke up this morning with acute onset left lower quadrant abdominal pain.  Pain is constant and severe.  She has associated nausea.  Patient states she has not had a bowel movement in about a week.  She thinks is due to her Suboxone.  She took some medicine and did an enema to try and encourage bowel movement, was unable to pass anything.  Patient states she has been having discomfort after urination and urinary frequency with only little urine output.  Additionally, she noticed vaginal discharge this morning which was white.  She denies fevers, chills, chest pain, shortness of breath.  She denies previous history of abdominal problems or surgeries.  She is sexually active with one female partner who is symptom-free.  They do not use condoms.  She has had her tubes tied, is not on birth control.  Her last period ended yesterday.  Additional history obtained from chart review.  Patient with a history of anxiety, depression, migraines, opioid use in remission  HPI     Past Medical History:  Diagnosis Date  . Allergic rhinitis   . Anxiety   . Asthma    allergic  . Depression   . Eczema   . Former smoker   . History of chicken pox   . Marijuana use   . Migraine   . MRSA (methicillin resistant staph aureus) culture positive   . Pregnancy induced hypertension    with last pregnancy  . Suboxone maintenance treatment complicating pregnancy, antepartum (HCC)   . Vaginal Pap smear, abnormal    LGSIL    Patient Active Problem List   Diagnosis Date Noted  . Encounter for induction of labor 03/12/2017  . Opioid use disorder, severe, in early remission (HCC) 10/30/2016  . Single umbilical  artery 10/21/2016  . Pregnancy 05/30/2015  . 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 Anemia in pregnancy 03/06/2015  . 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 High risk pregnancy, antepartum 11/08/2014  . Drug use affecting pregnancy 11/08/2014  . Eczema 06/06/2014  . Anxiety and depression 04/16/2011    Past Surgical History:  Procedure Laterality Date  . NO PAST SURGERIES  04/16/2011  . TUBAL LIGATION Bilateral 03/13/2017   Procedure: POST PARTUM TUBAL LIGATION;  Surgeon: Tilda Burrow, MD;  Location: [facility] BIRTHING SUITES;  Service: Gynecology;  Laterality: Bilateral;     OB History    Gravida  3   Para  2   Term  2   Preterm      AB  1   Living  2     SAB  1   TAB      Ectopic      Multiple  0   Live Births  2           Family History  Problem Relation Age of Onset  . Alcohol abuse Maternal Grandfather   . Lung cancer Mother        Living  . Hypertension Mother   . COPD Mother   . Cancer Mother   . Heart disease Maternal Grandmother   . Kidney disease Maternal Grandmother   .  Hypertension Maternal Grandmother   . Lung cancer Maternal Grandmother   . Diabetes Father        Living  . Hypertension Father   . Skin cancer Father   . Heart disease Paternal Grandmother   . Heart disease Paternal Grandfather   . Alcohol abuse Maternal Aunt   . COPD Maternal Aunt   . Alcohol abuse Brother   . Asthma Brother     Social History   Tobacco Use  . Smoking status: Current Every Day Smoker    Packs/day: 0.50    Types: Cigarettes    Last attempt to quit: 10/04/2014    Years since quitting: 4.5  . Smokeless tobacco: Never Used  Substance Use Topics  . Alcohol use: No  . Drug use: Yes    Types: Marijuana    Comment: suboxone, MJ last used 71mo ago    Home Medications Prior to Admission medications   Medication Sig Start Date End Date Taking? Authorizing Provider    amoxicillin-clavulanate (AUGMENTIN) 875-125 MG tablet Take 1 tablet by mouth 2 (two) times daily. 04/23/19   Latresha Yahr, PA-C  Buprenorphine HCl-Naloxone HCl (SUBOXONE) 8-2 MG FILM Place under the tongue. 1 film in the morning and 1 and 1/2 film in the evening    [provider]  dicyclomine (BENTYL) 20 MG tablet Take 1 tablet (20 mg total) by mouth 2 (two) times daily. 04/23/19   Carolyne Whitsel, PA-C  diphenhydramine-acetaminophen (TYLENOL PM) 25-500 MG TABS tablet Take 1 tablet by mouth at bedtime as needed (sleep).     [provider]  Doxylamine-Pyridoxine 10-10 MG TBEC Take 1 tablet at bedtime by mouth. Patient not taking: Reported on 03/09/2017 02/12/17   Levie Heritage, DO  ondansetron (ZOFRAN ODT) 4 MG disintegrating tablet Take 1 tablet (4 mg total) by mouth every 6 (six) hours as needed for nausea. 08/24/16   Levie Heritage, DO  ondansetron (ZOFRAN) 4 MG tablet Take 1 tablet (4 mg total) by mouth every 8 (eight) hours as needed for nausea or vomiting. 04/23/19   Jeremi Losito, PA-C  Prenatal Vit-Fe Fumarate-FA (PRENATAL VITAMINS PLUS) 27-1 MG TABS Take 1 tablet by mouth daily. 12/06/14   Federico Flake, MD  PROVENTIL HFA 108 [phone] Base) MCG/ACT inhaler INHALE ONE TO TWO PUFFS INTO LUNGS EVERY 6 HOURS AS NEEDED FOR WHEEZING FOR SHORTNESS OF BREATH 06/18/15   Federico Flake, MD  ranitidine (ZANTAC) 150 MG tablet Take 150 mg by mouth 2 (two) times daily.    [provider]  triamcinolone cream (KENALOG) 0.5 % Apply 1 application topically 3 (three) times daily. 11/19/16   Levie Heritage, DO    Allergies    Sulfa antibiotics  Review of Systems   Review of Systems  Gastrointestinal: Positive for abdominal pain, constipation, nausea and vomiting.  Genitourinary: Positive for decreased urine volume, dysuria, frequency and vaginal discharge.  All other systems reviewed and are negative.   Physical Exam Updated Vital Signs BP 114/74 (BP  Location: Right Arm)   Pulse (!) 56   Temp 98.2 F (36.8 C) (Oral)   Resp 16   Ht 5\' 2"  (1.575 m)   Wt 59 kg   LMP 04/18/2019   SpO2 100%   BMI 23.78 kg/m   Physical Exam Vitals and nursing note reviewed. Exam conducted with a chaperone present.  Constitutional:      General: She is not in acute distress.    Appearance: She is well-developed.  Comments: Appears uncomfortable due to pain, otherwise nontoxic  HENT:     Head: Normocephalic and atraumatic.  Eyes:     Extraocular Movements: Extraocular movements intact.     Conjunctiva/sclera: Conjunctivae normal.     Pupils: Pupils are equal, round, and reactive to light.  Cardiovascular:     Rate and Rhythm: Normal rate and regular rhythm.     Pulses: Normal pulses.  Pulmonary:     Effort: Pulmonary effort is normal. No respiratory distress.     Breath sounds: Normal breath sounds. No wheezing.  Abdominal:     General: There is no distension.     Palpations: Abdomen is soft. There is no mass.     Tenderness: There is abdominal tenderness. There is no guarding or rebound.     Comments: Tenderness palpation of left lower quadrant abdomen and suprapubic abdomen.  No CVA tenderness.  No tenderness palpation of upper abdomen.  No rigidity, guarding, distention.  Negative rebound.  No signs of peritonitis.  Genitourinary:    Cervix: No cervical motion tenderness or discharge.     Comments: No obvious vaginal discharge.  No CMT or adnexal tenderness. Musculoskeletal:        General: Normal range of motion.     Cervical back: Normal range of motion and neck supple.  Skin:    General: Skin is warm and dry.     Capillary Refill: Capillary refill takes less than 2 seconds.  Neurological:     Mental Status: She is alert and oriented to person, place, and time.     ED Results / Procedures / Treatments   Labs (all labs ordered are listed, but only abnormal results are displayed) Labs Reviewed  WET PREP, GENITAL - Abnormal;  Notable for the following components:      Result Value   Clue Cells Wet Prep HPF POC PRESENT (*)    WBC, Wet Prep HPF POC MANY (*)    All other components within normal limits  COMPREHENSIVE METABOLIC PANEL - Abnormal; Notable for the following components:   Potassium 3.4 (*)    Glucose, Bld 129 (*)    Albumin 5.1 (*)    All other components within normal limits  CBC - Abnormal; Notable for the following components:   WBC 19.7 (*)    All other components within normal limits  URINALYSIS, ROUTINE W REFLEX MICROSCOPIC - Abnormal; Notable for the following components:   APPearance CLOUDY (*)    pH >9.0 (*)    Hgb urine dipstick MODERATE (*)    Bilirubin Urine SMALL (*)    Ketones, ur 40 (*)    Protein, ur 30 (*)    Leukocytes,Ua SMALL (*)    All other components within normal limits  URINALYSIS, MICROSCOPIC (REFLEX) - Abnormal; Notable for the following components:   Bacteria, UA MANY (*)    All other components within normal limits  URINALYSIS, ROUTINE W REFLEX MICROSCOPIC - Abnormal; Notable for the following components:   Specific Gravity, Urine <1.005 (*)    Hgb urine dipstick MODERATE (*)    All other components within normal limits  URINALYSIS, MICROSCOPIC (REFLEX) - Abnormal; Notable for the following components:   Bacteria, UA FEW (*)    All other components within normal limits  URINE CULTURE  CULTURE, BLOOD (ROUTINE X 2)  CULTURE, BLOOD (ROUTINE X 2)  LIPASE, BLOOD  PREGNANCY, URINE  LACTIC ACID, PLASMA  RPR  HIV ANTIBODY (ROUTINE TESTING W REFLEX)  LACTIC ACID, PLASMA  GC/CHLAMYDIA PROBE  AMP ([place]) NOT AT [facility]    EKG None  Radiology CT ABDOMEN PELVIS W CONTRAST  Result Date: 04/23/2019 CLINICAL DATA:  Diverticulitis suspected, nausea and vomiting EXAM: CT ABDOMEN AND PELVIS WITH CONTRAST TECHNIQUE: Multidetector CT imaging of the abdomen and pelvis was performed using the standard protocol following bolus administration of intravenous contrast.  CONTRAST:  137mL OMNIPAQUE IOHEXOL 300 MG/ML  SOLN COMPARISON:  None FINDINGS: Lower chest: Lung bases are clear. Normal heart size. No pericardial effusion. Hepatobiliary: Diffuse hepatic hypoattenuation compatible with hepatic steatosis. No focal liver abnormality is seen. No gallstones, gallbladder wall thickening, or biliary dilatation. Pancreas: Unremarkable. No pancreatic ductal dilatation or surrounding inflammatory changes. Spleen: Normal in size without focal abnormality. Adrenals/Urinary Tract: Asymmetrically delayed left renal nephrogram with mild hydroureteronephrosis to the level of a 2 mm calculus at the left ureterovesicular junction. Some mild urothelial thickening and enhancement is noted as well. Minimal perinephric and periureteral stranding. No obstructing right urinary tract calculi. No worrisome renal lesions. Urinary bladder is largely decompressed at the time of exam and therefore poorly evaluated by CT imaging. Mild wall thickening likely related to underdistention. Stomach/Bowel: Distal esophagus, stomach and duodenal sweep are unremarkable. No small bowel wall thickening or dilatation. No evidence of obstruction. Appendix is not visualized. No pericecal inflammation to suggest an occult appendicitis. There is some mild edematous mural thickening of the colon with faint pericolonic stranding diffusely. Vascular/Lymphatic: The aorta is normal caliber. No suspicious or enlarged lymph nodes in the included lymphatic chains. Reproductive: Anteverted uterus. Bilateral ligation clips. Normal follicles in both ovaries. Other: No abdominopelvic free fluid or free gas. No bowel containing hernias. Small fat containing umbilical hernia. Musculoskeletal: No acute osseous abnormality or suspicious osseous lesion. IMPRESSION: 1. 2 mm calculus at the left UVJ with associated mild hydroureteronephrosis and delayed left renal nephrogram. Recommend correlation with results of urinalysis and patient's  symptoms to exclude superimposed infection. 2. Mild edematous mural thickening of the colon with faint pericolonic stranding diffusely may indicate a mild infectious or inflammatory colitis as well. No significant diverticular disease. 3. Hepatic steatosis. Electronically Signed   By: Lovena Le M.D.   On: 04/23/2019 17:12    Procedures Procedures (including critical care time)  Medications Ordered in ED Medications  acetaminophen (TYLENOL) tablet 1,000 mg (1,000 mg Oral Given 04/23/19 1723)  iohexol (OMNIPAQUE) 300 MG/ML solution 100 mL (100 mLs Intravenous Contrast Given 04/23/19 1657)  cefTRIAXone (ROCEPHIN) 1 g in sodium chloride 0.9 % 100 mL IVPB (0 g Intravenous Stopped 04/23/19 1944)  sodium chloride 0.9 % bolus 500 mL (0 mLs Intravenous Stopped 04/23/19 1944)  dicyclomine (BENTYL) capsule 10 mg (10 mg Oral Given 04/23/19 1945)    ED Course  I have reviewed the triage vital signs and the nursing notes.  Pertinent labs & imaging results that were available during my care of the patient were reviewed by me and considered in my medical decision making (see chart for details).    MDM Rules/Calculators/A&P                      Patient presenting for evaluation of abdominal pain and constipation.  Physical exam shows patient appears uncomfortable, but otherwise nontoxic.  She does have left lower quadrant domino pain and suprapubic pain.  Additionally, patient reports urinary and GU symptoms symptoms.  Will obtain labs.  CT to assess for diverticulitis vs obstruction.  Will perform pelvic exam, consider need for ultrasound.  Patient does not want any narcotic  pain medicine at this time, will give Tylenol.  Pelvic exam reassuring, no obvious discharge.  No CMT or adnexal tenderness.  I do not believe she needs a pelvic ultrasound at this time.  Labs show leukocytosis of 19.7.  Otherwise reassuring.  Initial urine contaminated with squamous cells.   CT shows left kidney stone at the UVJ  with mild hydro and perinephric stranding.  In the setting of an elevated white count and possible urine infection, will consult with urology.  Discussed with Dr. Laverle Patter from urology who recommends we collection of urine and reassessment.  If urine is normal, can discharge with outpatient follow-up.  Additionally, CT shows colitis.  Will treat with Bentyl and antibiotics.  There is no mention of significant constipation on CT, no obstruction or diverticulitis.    Repeat urine much improved, shows only a few bacteria, but no white cells, nitrates, leuks.  As such, low suspicion for infection.  Discussed with Dr. Laverle Patter, who is agreeable to discharge and outpatient follow-up.  Discussed findings and plan with patient, who is agreeable.  Discussed close monitoring symptoms, and prompt return to the ER with any worsening.  At this time, patient appears safe for discharge.  Return precautions given.  Patient states she understands and agrees to plan.  Final Clinical Impression(s) / ED Diagnoses Final diagnoses:  Kidney stone on left side  Colitis    Rx / DC Orders ED Discharge Orders         Ordered    amoxicillin-clavulanate (AUGMENTIN) 875-125 MG tablet  2 times daily     04/23/19 1956    ondansetron (ZOFRAN) 4 MG tablet  Every 8 hours PRN     04/23/19 1956    dicyclomine (BENTYL) 20 MG tablet  2 times daily     04/23/19 1956           Alveria Apley, PA-C 04/23/19 2029    Tilden Fossa, MD 04/23/19 2359

## 2019-04-23 NOTE — Discharge Instructions (Signed)
For the kidney stone:  Use ibuprofen for pain. You may also use Tylenol as needed for pain control. Make sure staying well-hydrated water. Your urine should be clear to pale yellow. Use Zofran as needed for nausea or vomiting. Follow-up with the urologist listed below if your pain from a kidney stone is not improving.  For the colitis (intestine inflammation): Use Bentyl as needed for abdominal cramping. Take the Augmentin as prescribed.  Take the entire course, even if your symptoms improve.   Return to the emergency room if you develop fevers, persistent vomiting, severe worsening abdominal pain, inability to urinate, or any new, worsening, or concerning symptoms.

## 2019-04-23 NOTE — ED Triage Notes (Signed)
LLQ pain today. Constipation x 1 week.

## 2019-04-23 NOTE — ED Notes (Signed)
Pt know to try to give [place] some more urine when she can.

## 2019-04-23 NOTE — ED Notes (Signed)
Pt getting undressed for exam

## 2019-04-24 LAB — URINE CULTURE: Culture: NO GROWTH

## 2019-04-24 LAB — RPR: RPR Ser Ql: NONREACTIVE

## 2019-04-25 LAB — GC/CHLAMYDIA PROBE AMP (~~LOC~~) NOT AT ARMC
Chlamydia: NEGATIVE
Neisseria Gonorrhea: NEGATIVE

## 2019-04-28 LAB — CULTURE, BLOOD (ROUTINE X 2)
Culture: NO GROWTH
Culture: NO GROWTH
Special Requests: ADEQUATE

## 2020-08-22 ENCOUNTER — Ambulatory Visit (HOSPITAL_COMMUNITY)
Admission: EM | Admit: 2020-08-22 | Discharge: 2020-08-22 | Disposition: A | Discharge status: Home / Self Care | Payer: Medicaid Other | Attending: Psychiatry | Admitting: Psychiatry

## 2020-08-22 ENCOUNTER — Encounter (HOSPITAL_COMMUNITY): Payer: Self-pay | Admitting: Oral Surgery

## 2020-08-22 ENCOUNTER — Other Ambulatory Visit: Payer: Self-pay

## 2020-08-22 DIAGNOSIS — F1190 Opioid use, unspecified, uncomplicated: Secondary | ICD-10-CM | POA: Insufficient documentation

## 2020-08-22 NOTE — ED Notes (Signed)
Patient received After Visit Summary with follow up instructions. Patient denies SI/ HI and AVH during time of discharge. All belongings given back to patient. Safe transportation services called to take patient home.
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
## 2020-08-22 NOTE — Discharge Instructions (Addendum)
Patient is instructed prior to discharge to:  Take all medications as prescribed by his/her mental healthcare provider. Report any adverse effects and or reactions from the medicines to his/her outpatient provider promptly. Keep all scheduled appointments, to ensure that you are getting refills on time and to avoid any interruption in your medication.  If you are unable to keep an appointment call to reschedule.  Be sure to follow-up with resources and follow-up appointments provided.  Patient has been instructed & cautioned: To not engage in alcohol and or illegal drug use while on prescription medicines. In the event of worsening symptoms, patient is instructed to call the crisis hotline, 911 and or go to the nearest ED for appropriate evaluation and treatment of symptoms. To follow-up with his/her primary care provider for your other medical issues, concerns and or health care needs.    Substance Abuse Treatment Resources listed Below:  [facility] - Admissions are currently completed Monday through Friday at 8am; both appointments and walk-ins are accepted.  Any individual that is a [place] resident may present for a substance abuse screening and assessment for admission.  A person may be referred by numerous sources or self-refer.   Potential clients will be screened for medical necessity and appropriateness for the program.  Clients must meet criteria for high-intensity residential treatment services.  If clinically appropriate, a client will continue with the comprehensive clinical assessment and intake process, as well as enrollment in the [facility].  Address: [address] [place], [place] [address] Admin Hours: Mon-Fri 8AM to 5PM Center Hours: 24/7 Phone: [phone] Fax: [phone]  [facility] Address: [address], [place], [place] [address] [facility] ([facility]) Hours: 24/7 Phone:  [phone] Fax: [phone]  [facility]): (offers outpatient therapy and intensive outpatient substance abuse therapy).  [address], [place], [place] [address] Phone: [phone]  [facility]: Offers FREE recovery skills classes, support groups, 1:1 Peer Support, and Compeer Classes. [address], [place], [place] [address] Phone: [phone] (Call to complete intake).   [facility] [address] [place], [place] [address] Phone: [phone] ext [phone] [facility] provides food, shelter and other programs and services to the homeless men of [place] through our men's program.  By offering safe shelter, three meals a day, clean clothing, Biblical counseling, financial planning, vocational training, GED/education and employment assistance, we've helped mend the shattered lives of many homeless men since opening in 1974.  We have approximately 267 beds available, with a max of 312 beds including mats for emergency situations and currently house an average of 270 men a night.  Prospective Client Check-In Information Photo ID Required (State/ Out of State/ DOC) - if photo ID is not available, clients are required to have a printout of a police/sheriff's criminal history report. Help out with chores around the Mission. No sex offender of any type (pending, charged, registered and/or any other sex related offenses) will be permitted to check in. Must be willing to abide by all rules, regulations, and policies established by the [facility]. The following will be provided - shelter, food, clothing, and biblical counseling. If you or someone you know is in need of assistance at our men's shelter in [place], [place], please call [phone] ext. [phone].  [facility]-will provide timely access to mental health services for children and adolescents (4-17) and adults    presenting in a mental health crisis. The program is designed for those who need urgent Behavioral Health or Substance Use treatment and are not experiencing a medical crisis that would typically require an emergency room visit.    [address] [place], [place] [address] Phone: [phone] [email or web address]  [facility]: Phone#: [phone]  The [facility] SA Intensive Outpatient Program (SAIOP) means structured individual and group addiction activities and services that are provided at an outpatient program designed to assist adult and adolescent consumers to begin recovery and learn skills for recovery maintenance. The [facility] SAIOP program is offered at least 3 hours a day, 3 days a week.SAIOP services shall include a structured program consisting of, but not limited to, the following services: Individual counseling and support; Group counseling and support; Family counseling, training or support; Biochemical assays to identify recent drug use (e.g., urine drug screens); Strategies for relapse prevention to include community and social support systems in treatment; Life skills; Crisis contingency planning; Disease Management; and Treatment support activities that have been adapted or specifically designed for persons with physical disabilities, or persons with co-occurring disorders of mental illness and substance abuse/dependence or mental retardation/developmental disability and substance abuse/dependence. Phone: [phone]  Address:   [facility] [place] will also offer the following outpatient services: (Monday through Friday 8am-5pm)   Partial Hospitalization Program (PHP) Substance Abuse Intensive Outpatient Program (SA-IOP) Group Therapy Medication Management Peer Living Room We also provide (24/7):  Assessments: Our mental health clinician and providers will conduct a focused mental health evaluation, assessing for immediate  safety concerns and further mental health needs. Referral: Our team will provide resources and help connect to community based mental health treatment, when indicated, including psychotherapy, psychiatry, and other specialized behavioral health or substance use disorder services (for those not already in treatment). Transitional Care: Our team providers in person bridging and/or telephonic follow-up during the patient's transition to outpatient services.  [facility] 24-Hour Call Center: [phone] Behavioral Health Crisis Line: [phone]  

## 2020-08-22 NOTE — BH Assessment (Signed)
Patient Olivia Faulkner IVC by father . Patient denies SI/ HI / AVH does not know why she is here was just sitting on side of the road. Patient is with Inetta Fermo routine   Patient does report that she was supposed to complete program at [facility] but after her car broke down she was unable to continue. Patient states he is homeless

## 2020-08-22 NOTE — ED Provider Notes (Signed)
[facility]al [facility] Urgent Care Medical Screening Exam  Patient Name: Olivia Faulkner MRN: [id number] Date of Evaluation: 08/22/20 Chief Complaint:   Diagnosis:  Final diagnoses:  Opioid use disorder    History of Present illness: Olivia Faulkner is a 35 y.o. female.  Patient presents under involuntary commitment to [facility].  Petition reads: "Respondent has been previously diagnosed with bipolar disorder.  Family does not know if she has medication or if she is taking medication for her mental health issues.  She has a history of mental health commitments, most recently in [employer] last year.  Family states that she is a danger to herself as she continues to abuse illegal narcotics including heroin and meth.  She has threatened suicide in the past.  Today her roommates called the police as respondent was acting erratically.  Responding officer suggested that family seek IVC."  Patient assessed by nurse practitioner.  She is alert and oriented, answers appropriately.  She is pleasant and cooperative during assessment.  She states "I know the reason I am here is because of my dad, he did the same thing last year."  Olivia Faulkner denies suicidal and homicidal ideations today.  She denies any history of suicide attempts, denies any history of self-harm behaviors.  She denies both auditory and visual hallucinations.  There is no evidence of delusional thought content and she denies symptoms of paranoia.  She contracts for safety with this Clinical research associate.  She reports recent stressor includes loss of custody of her 2 children ages 55 and 51 years old who now resides with her biological father.  She states "every time I takes her called her dad he will let me see them."  Additional stressor includes, she has most recently resided with a friend for the last 2 months.  She reports her friend will not allow her to remain at his home because he has a romantic interest in her that she does not  interest Olivia Faulkner at this time.  Olivia Faulkner reports she is seen by outpatient psychiatry through [facility] in [place] she reports she last saw outpatient psychiatrist 3 weeks ago, current medication includes trazodone 50 mg nightly as needed for sleep.  She denies medications aside from trazodone.  She reports she is scheduled to attend group meetings to address history of substance use disorder every Tuesday but has not followed up related to transportation issues.  She reports she will reside with a different friend moving forward.  She denies access to weapons.  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  She reports she will not follow-up with employment opportunities until she recovers from her dental surgery scheduled for tomorrow at [facility].  She endorses average sleep and appetite.  She denies alcohol and substance use currently.  She reports she does have a history of opioid use disorder, last use of methamphetamine approximately 6 months ago.  Patient offered support and encouragement.  She gives verbal consent to speak with her aunt, Dyane Dustman phone number [phone].  Spoke with patient's aunt who denies concern for patient's safety.  Steward Drone states "I think she needs help for drugs, she needs to prove herself and find a place to live and work because that is the only way she will be able to get her kids." Patient does not give consent to speak with her father, Lillis Nuttle.  Patient's father did attempt to call this Clinical research associate, and  explained that I could not discuss adult patient treatment plan without consent.  Psychiatric Specialty Exam  Presentation  General Appearance:Appropriate for Environment; Casual  Eye Contact:Good  Speech:Clear and Coherent; Normal Rate  Speech Volume:Normal  Handedness:Right   Mood and Affect  Mood:Euthymic  Affect:Appropriate; Congruent   Thought Process  Thought Processes:Coherent;  Goal Directed  Descriptions of Associations:Intact  Orientation:Full (Time, Place and Person)  Thought Content:Logical    Hallucinations:None  Ideas of Reference:None  Suicidal Thoughts:No  Homicidal Thoughts:No   Sensorium  Memory:Immediate Good; Recent Good; Remote Good  Judgment:Fair  Insight:Fair   Executive Functions  Concentration:Good  Attention Span:Good  Recall:Good  Fund of Knowledge:Good  Language:Good   Psychomotor Activity  Psychomotor Activity:Normal   Assets  Assets:No data recorded  Sleep  Sleep:No data recorded Number of hours: No data recorded  No data recorded  Physical Exam: Physical Exam Vitals and nursing note reviewed.  Constitutional:      Appearance: Normal appearance. She is well-developed.  HENT:     Head: Normocephalic and atraumatic.     Nose: Nose normal.  Cardiovascular:     Rate and Rhythm: Normal rate.  Pulmonary:     Effort: Pulmonary effort is normal.  Musculoskeletal:        General: Normal range of motion.     Cervical back: Normal range of motion.  Neurological:     General: No focal deficit present.     Mental Status: She is alert and oriented to person, place, and time.  Psychiatric:        Attention and Perception: Attention and perception normal.        Mood and Affect: Mood and affect normal.        Speech: Speech normal.        Behavior: Behavior normal. Behavior is cooperative.        Thought Content: Thought content normal.        Cognition and Memory: Cognition and memory normal.        Judgment: Judgment normal.    Review of Systems  Constitutional: Negative.   HENT: Negative.   Eyes: Negative.   Respiratory: Negative.   Cardiovascular: Negative.   Gastrointestinal: Negative.   Genitourinary: Negative.   Musculoskeletal: Negative.   Skin: Negative.   Neurological: Negative.   Endo/Heme/Allergies: Negative.   Psychiatric/Behavioral: Negative.    Blood pressure 112/77, pulse (!)  110, temperature 99.1 F (37.3 C), temperature source Oral, resp. rate 18, SpO2 99 %, unknown if currently breastfeeding. There is no height or weight on file to calculate BMI.  Musculoskeletal: Strength & Muscle Tone: within normal limits Gait & Station: normal Patient leans: N/A   [facility] MSE Discharge Disposition for Follow up and Recommendations: Based on my evaluation the patient does not appear to have an emergency medical condition and can be discharged with resources and follow up care in outpatient services for Medication Management and Individual Therapy  Patient reviewed with Dr. Bronwen Betters. Follow-up with established outpatient psychiatry. Substance use treatment resources provided.   Lenard Lance, FNP 08/22/2020, 1:04 PM

## 2020-08-23 ENCOUNTER — Encounter (HOSPITAL_COMMUNITY): Payer: Self-pay | Admitting: Anesthesiology

## 2020-08-23 ENCOUNTER — Ambulatory Visit (HOSPITAL_COMMUNITY): Admission: RE | Admit: 2020-08-23 | Payer: Medicaid Other | Source: Home / Self Care | Admitting: Oral Surgery

## 2020-08-23 ENCOUNTER — Encounter (HOSPITAL_COMMUNITY): Admission: RE | Payer: Self-pay | Source: Home / Self Care

## 2020-08-23 HISTORY — DX: Personal history of urinary calculi: Z87.442

## 2020-08-23 SURGERY — MULTIPLE EXTRACTION WITH ALVEOLOPLASTY
Anesthesia: General | Laterality: Bilateral

## 2020-08-23 MED ORDER — PROPOFOL 10 MG/ML IV BOLUS
INTRAVENOUS | Status: AC
  Filled 2020-08-23: qty 20

## 2020-08-23 MED ORDER — ROCURONIUM BROMIDE 10 MG/ML (PF) SYRINGE
PREFILLED_SYRINGE | INTRAVENOUS | Status: AC
  Filled 2020-08-23: qty 10

## 2020-08-23 MED ORDER — FENTANYL CITRATE (PF) 250 MCG/5ML IJ SOLN
INTRAMUSCULAR | Status: AC
  Filled 2020-08-23: qty 5

## 2020-08-23 MED ORDER — LIDOCAINE 2% (20 MG/ML) 5 ML SYRINGE
INTRAMUSCULAR | Status: AC
  Filled 2020-08-23: qty 5

## 2020-08-23 MED ORDER — MIDAZOLAM HCL 2 MG/2ML IJ SOLN
INTRAMUSCULAR | Status: AC
  Filled 2020-08-23: qty 2

## 2020-08-23 NOTE — Anesthesia Preprocedure Evaluation (Deleted)
Anesthesia Evaluation  Patient identified by MRN, date of birth, ID band Patient awake    Reviewed: Allergy & Precautions, H&P , NPO status , Patient's Chart, lab work & pertinent test results  Airway Mallampati: II   Neck ROM: full    Dental   Pulmonary asthma , Current Smoker,    breath sounds clear to auscultation       Cardiovascular hypertension,  Rhythm:regular Rate:Normal     Neuro/Psych  Headaches, PSYCHIATRIC DISORDERS Anxiety Depression    GI/Hepatic (+)     substance abuse  marijuana use, On suboxone   Endo/Other    Renal/GU      Musculoskeletal  (+) narcotic dependent  Abdominal   Peds  Hematology   Anesthesia Other Findings   Reproductive/Obstetrics                             Anesthesia Physical Anesthesia Plan  ASA: II  Anesthesia Plan: General   Post-op Pain Management:    Induction: Intravenous  PONV Risk Score and Plan: 2 and Ondansetron, Dexamethasone, Midazolam and Treatment may vary due to age or medical condition  Airway Management Planned: Nasal ETT  Additional Equipment:   Intra-op Plan:   Post-operative Plan: Extubation in OR  Informed Consent: I have reviewed the patients History and Physical, chart, labs and discussed the procedure including the risks, benefits and alternatives for the proposed anesthesia with the patient or authorized representative who has indicated his/her understanding and acceptance.     Dental advisory given  Plan Discussed with: CRNA, Anesthesiologist and Surgeon  Anesthesia Plan Comments:         Anesthesia Quick Evaluation

## 2020-12-02 ENCOUNTER — Other Ambulatory Visit: Payer: Self-pay

## 2020-12-02 ENCOUNTER — Ambulatory Visit: Payer: Medicaid Other | Admitting: Physician Assistant

## 2020-12-02 VITALS — BP 108/48 | HR 80 | Temp 98.2°F | Resp 18 | Ht 63.0 in | Wt 122.0 lb

## 2020-12-02 DIAGNOSIS — F1010 Alcohol abuse, uncomplicated: Secondary | ICD-10-CM | POA: Diagnosis not present

## 2020-12-02 DIAGNOSIS — I959 Hypotension, unspecified: Secondary | ICD-10-CM | POA: Diagnosis not present

## 2020-12-02 DIAGNOSIS — L02422 Furuncle of left axilla: Secondary | ICD-10-CM

## 2020-12-02 DIAGNOSIS — L02425 Furuncle of right lower limb: Secondary | ICD-10-CM | POA: Diagnosis not present

## 2020-12-02 DIAGNOSIS — F1110 Opioid abuse, uncomplicated: Secondary | ICD-10-CM

## 2020-12-02 DIAGNOSIS — F209 Schizophrenia, unspecified: Secondary | ICD-10-CM

## 2020-12-02 DIAGNOSIS — F1510 Other stimulant abuse, uncomplicated: Secondary | ICD-10-CM

## 2020-12-02 MED ORDER — CEPHALEXIN 500 MG PO CAPS: 500.0000 mg | ORAL_CAPSULE | Freq: Four times a day (QID) | ORAL | 0 refills | Status: AC

## 2020-12-02 NOTE — Progress Notes (Signed)
Patient has eaten today and taken medication today. Patient denies pain at this time . Patient noticed 3 days ago having a boil on the right hip and under the left arm a pit. Hip boil burst in the shower and the underarm boil has increased in size.

## 2020-12-02 NOTE — Patient Instructions (Signed)
You will take Keflex 4 times a day for the next 5 days.  Make sure that you keep the areas clean and dry.  I do encourage you to increase your hydration, check blood pressure on a daily basis, keep a written log and have available for all office visits.  Please let us know if there is anything else we can do for you.  Roney Jaffe, PA-C Physician Assistant [facility] [email or web address]   Skin Abscess  A skin abscess is an infected area on or under your skin that contains a collection of pus and other material. An abscess may also be called a furuncle,carbuncle, or boil. An abscess can occur in or on almost any part of your body. Some abscesses break open (rupture) on their own. Most continue to get worse unless they are treated. The infection can spread deeper into the body and eventually into your blood, whichcan make you feel ill. Treatment usually involves draining the abscess. What are the causes? An abscess occurs when germs, like bacteria, pass through your skin and cause an infection. This may be caused by: A scrape or cut on your skin. A puncture wound through your skin, including a needle injection or insect bite. Blocked oil or sweat glands. Blocked and infected hair follicles. A cyst that forms beneath your skin (sebaceous cyst) and becomes infected. What increases the risk? This condition is more likely to develop in people who: Have a weak body defense system (immune system). Have diabetes. Have dry and irritated skin. Get frequent injections or use illegal IV drugs. Have a foreign body in a wound, such as a splinter. Have problems with their lymph system or veins. What are the signs or symptoms? Symptoms of this condition include: A painful, firm bump under the skin. A bump with pus at the top. This may break through the skin and drain. Other symptoms include: Redness surrounding the abscess  site. Warmth. Swelling of the lymph nodes (glands) near the abscess. Tenderness. A sore on the skin. How is this diagnosed? This condition may be diagnosed based on: A physical exam. Your medical history. A sample of pus. This may be used to find out what is causing the infection. Blood tests. Imaging tests, such as an ultrasound, CT scan, or MRI. How is this treated? A small abscess that drains on its own may not need treatment. Treatment for larger abscesses may include: Moist heat or heat pack applied to the area several times a day. A procedure to drain the abscess (incision and drainage). Antibiotic medicines. For a severe abscess, you may first get antibiotics through an IV and then change to antibiotics by mouth. Follow these instructions at home: Medicines  Take over-the-counter and prescription medicines only as told by your health care provider. If you were prescribed an antibiotic medicine, take it as told by your health care provider. Do not stop taking the antibiotic even if you start to feel better.  Abscess care  If you have an abscess that has not drained, apply heat to the affected area. Use the heat source that your health care provider recommends, such as a moist heat pack or a heating pad. Place a towel between your skin and the heat source. Leave the heat on for 20-30 minutes. Remove the heat if your skin turns bright red. This is especially important if you are unable to feel pain, heat, or cold. You may have a greater risk of getting burned. Follow instructions from your health  care provider about how to take care of your abscess. Make sure you: Cover the abscess with a bandage (dressing). Change your dressing or gauze as told by your health care provider. Wash your hands with soap and water before you change the dressing or gauze. If soap and water are not available, use hand sanitizer. Check your abscess every day for signs of a worsening infection. Check  for: More redness, swelling, or pain. More fluid or blood. Warmth. More pus or a bad smell.  General instructions To avoid spreading the infection: Do not share personal care items, towels, or hot tubs with others. Avoid making skin contact with other people. Keep all follow-up visits as told by your health care provider. This is important. Contact a health care provider if you have: More redness, swelling, or pain around your abscess. More fluid or blood coming from your abscess. Warm skin around your abscess. More pus or a bad smell coming from your abscess. A fever. Muscle aches. Chills or a general ill feeling. Get help right away if you: Have severe pain. See red streaks on your skin spreading away from the abscess. Summary A skin abscess is an infected area on or under your skin that contains a collection of pus and other material. A small abscess that drains on its own may not need treatment. Treatment for larger abscesses may include having a procedure to drain the abscess and taking an antibiotic. This information is not intended to replace advice given to you by your health care provider. Make sure you discuss any questions you have with your healthcare provider. Document Revised: 07/14/2018 Document Reviewed: 05/06/2017 Elsevier Patient Education  2022 [employer]   Hypotension As your heart beats, it forces blood through your body. Hypotension, commonly called low blood pressure, is when the force of blood pumping through your arteries is too weak. Arteries are blood vessels that carry blood from the heart throughout the body. Depending on the cause and severity, hypotension may be harmless (benign) or may cause serious problems (be critical). When blood pressure is too low, you may not get enough blood to your brain or to the rest of your organs. This can cause weakness, light-headedness, rapidheartbeat, and fainting. What are the causes? This condition may be  caused by: Blood loss. Loss of body fluids (dehydration). Heart problems. Hormone (endocrine) problems. Pregnancy. Severe infection. Lack of certain nutrients. Severe allergic reactions (anaphylaxis). Certain medicines, such as blood pressure medicine or medicines that make the body lose excess fluids (diuretics). Sometimes, hypotension may be caused by not taking medicine as directed, such as taking too much of a certain medicine. What increases the risk? The following factors may make you more likely to develop this condition: Age. Risk increases as you get older. Conditions that affect the heart or the central nervous system. Taking certain medicines, such as blood pressure medicine or diuretics. Being pregnant. What are the signs or symptoms? Common symptoms of this condition include: Weakness. Light-headedness. Dizziness. Blurred vision. Fatigue. Rapid heartbeat. Fainting, in severe cases. How is this diagnosed? This condition is diagnosed based on: Your medical history. Your symptoms. Your blood pressure measurement. Your health care provider will check your blood pressure when you are: Lying down. Sitting. Standing. A blood pressure reading is recorded as two numbers, such as "120 over 80" (or 120/80). The first ("top") number is called the systolic pressure. It is a measure of the pressure in your arteries as your heart beats. The second ("bottom") number is  called the diastolic pressure. It is a measure of the pressure in your arteries when your heart relaxes between beats. Blood pressure is measured in a unit called mm Hg. Healthy blood pressure for most adults is 120/80. If your blood pressure is below 90/60, you may be diagnosed withhypotension. Other information or tests that may be used to diagnose hypotension include: Your other vital signs, such as your heart rate and temperature. Blood tests. Tilt table test. For this test, you will be safely secured to a table  that moves you from a lying position to an upright position. Your heart rhythm and blood pressure will be monitored during the test. How is this treated? Treatment for this condition may include: Changing your diet. This may involve eating more salt (sodium) or drinking more water. Taking medicines to raise your blood pressure. Changing the dosage of certain medicines you are taking that might be lowering your blood pressure. Wearing compression stockings. These stockings help to prevent blood clots and reduce swelling in your legs. In some cases, you may need to go to the hospital for: Fluid replacement. This means you will receive fluids through an IV. Blood replacement. This means you will receive donated blood through an IV (transfusion). Treating an infection or heart problems, if this applies. Monitoring. You may need to be monitored while medicines that you are taking wear off. Follow these instructions at home: Eating and drinking  Drink enough fluid to keep your urine pale yellow. Eat a healthy diet, and follow instructions from your health care provider about eating or drinking restrictions. A healthy diet includes: Fresh fruits and vegetables. Whole grains. Lean meats. Low-fat dairy products. Eat extra salt only as directed. Do not add extra salt to your diet unless your health care provider told you to do that. Eat frequent, small meals. Avoid standing up suddenly after eating.  Medicines Take over-the-counter and prescription medicines only as told by your health care provider. Follow instructions from your health care provider about changing the dosage of your current medicines, if this applies. Do not stop or adjust any of your medicines on your own. General instructions  Wear compression stockings as told by your health care provider. Get up slowly from lying down or sitting positions. This gives your blood pressure a chance to adjust. Avoid hot showers and excessive  heat as directed by your health care provider. Return to your normal activities as told by your health care provider. Ask your health care provider what activities are safe for you. Do not use any products that contain nicotine or tobacco, such as cigarettes, e-cigarettes, and chewing tobacco. If you need help quitting, ask your health care provider. Keep all follow-up visits as told by your health care provider. This is important.  Contact a health care provider if you: Vomit. Have diarrhea. Have a fever for more than 2-3 days. Feel more thirsty than usual. Feel weak and tired. Get help right away if you: Have chest pain. Have a fast or irregular heartbeat. Develop numbness in any part of your body. Cannot move your arms or your legs. Have trouble speaking. Become sweaty or feel light-headed. Faint. Feel short of breath. Have trouble staying awake. Feel confused. Summary Hypotension is when the force of blood pumping through your arteries is too weak. Hypotension may be harmless (benign) or may cause serious problems (be critical). Treatment for this condition may include changing your diet, changing your medicines, and wearing compression stockings. In some cases, you may need  to go to the hospital for fluid or blood replacement. This information is not intended to replace advice given to you by your health care provider. Make sure you discuss any questions you have with your healthcare provider. Document Revised: 09/16/2017 Document Reviewed: 09/16/2017 Elsevier Patient Education  2022 [employer].

## 2020-12-02 NOTE — Progress Notes (Signed)
New Patient Office Visit  Subjective:  Patient ID: Olivia Faulkner, female    DOB: Mar 28, 1986  Age: 35 y.o. MRN: [id number]  CC:  Chief Complaint  Patient presents with   Recurrent Skin Infections    HPI Olivia Faulkner reports that she is currently residing at [facility] residential treatment program for substance abuse.  Reports that she had a boil on her right hip and one on to her her left arm for the last 3 days.  States that they are tender to touch, states that the one on her hip did pop in the shower earlier today.  Has not tried anything for relief.   States that she does have a history of hypotension, denies any hypotensive symptoms, does state that she feels that she stays well-hydrated.   Past Medical History:  Diagnosis Date   Allergic rhinitis    Anxiety    08/22/20- not current   Asthma    allergic   Depression    Eczema    Former smoker    History of chicken pox    History of kidney stones    never passed   Marijuana use    Migraine    MRSA (methicillin resistant staph aureus) culture positive    Pregnancy induced hypertension    with last pregnancy   Suboxone maintenance treatment complicating pregnancy, antepartum (HCC)    Vaginal Pap smear, abnormal    LGSIL    Past Surgical History:  Procedure Laterality Date   TUBAL LIGATION Bilateral 03/13/2017   Procedure: POST PARTUM TUBAL LIGATION;  Surgeon: Tilda Burrow, MD;  Location: [facility];  Service: Gynecology;  Laterality: Bilateral;   WISDOM TOOTH EXTRACTION      Family History  Problem Relation Age of Onset   Alcohol abuse Maternal Grandfather    Lung cancer Mother        Living   Hypertension Mother    COPD Mother    Cancer Mother    Heart disease Maternal Grandmother    Kidney disease Maternal Grandmother    Hypertension Maternal Grandmother    Lung cancer Maternal Grandmother    Diabetes Father        Living   Hypertension Father    Skin cancer Father    Heart disease  Paternal Grandmother    Heart disease Paternal Grandfather    Alcohol abuse Maternal Aunt    COPD Maternal Aunt    Alcohol abuse Brother    Asthma Brother     Social History   Socioeconomic History   Marital status: Single    Spouse name: Not on file   Number of children: Not on file   Years of education: Not on file   Highest education level: Not on file  Occupational History   Not on file  Tobacco Use   Smoking status: Every Day    Packs/day: 0.50    Types: Cigarettes    Last attempt to quit: 10/04/2014    Years since quitting: 6.1   Smokeless tobacco: Never  Vaping Use   Vaping Use: Every day  Substance and Sexual Activity   Alcohol use: No   Drug use: Yes    Types: Marijuana    Comment: suboxone, MJ last used 62mo ago 08/22/20- not using now   Sexual activity: Yes    Birth control/protection: None  Other Topics Concern   Not on file  Social History Narrative   Not on file   Social Determinants of Health  Financial Resource Strain: Not on file  Food Insecurity: Not on file  Transportation Needs: Not on file  Physical Activity: Not on file  Stress: Not on file  Social Connections: Not on file  Intimate Partner Violence: Not on file    ROS Review of Systems  Constitutional:  Negative for chills and fever.  HENT: Negative.    Eyes: Negative.   Respiratory:  Negative for shortness of breath.   Cardiovascular:  Negative for chest pain.  Gastrointestinal: Negative.   Endocrine: Negative.   Genitourinary: Negative.   Musculoskeletal: Negative.   Skin:  Positive for color change.  Allergic/Immunologic: Negative.   Neurological: Negative.   Hematological: Negative.   Psychiatric/Behavioral:  Negative for dysphoric mood, self-injury, sleep disturbance and suicidal ideas. The patient is not nervous/anxious.    Objective:   Today's Vitals: BP (!) 108/48 (BP Location: Left Arm, Patient Position: Sitting, Cuff Size: Normal)   Pulse 80   Temp 98.2 F (36.8 C)  (Oral)   Resp 18   Ht 5\' 3"  (1.6 m)   Wt 122 lb (55.3 kg)   LMP 11/18/2020   SpO2 100%   BMI 21.61 kg/m   Physical Exam Skin:         Comments: Nonfluctuant, tender to light touch abscess noted in left axilla, nonfluctuant tender to touch first abscess noted on right upper pelvis    Assessment & Plan:   Problem List Items Addressed This Visit       Cardiovascular and Mediastinum   Hypotension     Other   Opioid abuse (HCC)   Methamphetamine abuse (HCC)   Schizophrenia (HCC)   Other Visit Diagnoses     Furuncle of left axilla    -  Primary   Relevant Medications   cephALEXin (KEFLEX) 500 MG capsule   Furuncle of right hip       Relevant Medications   cephALEXin (KEFLEX) 500 MG capsule   Alcohol abuse           Outpatient Encounter Medications as of 12/02/2020  Medication Sig   cephALEXin (KEFLEX) 500 MG capsule Take 1 capsule (500 mg total) by mouth 4 (four) times daily for 5 days.   divalproex (DEPAKOTE ER) 500 MG 24 hr tablet Take 500 mg by mouth.   PROVENTIL HFA 108 (90 Base) MCG/ACT inhaler INHALE ONE TO TWO PUFFS INTO LUNGS EVERY 6 HOURS AS NEEDED FOR WHEEZING FOR SHORTNESS OF BREATH (Patient taking differently: 1-2 puffs every 6 (six) hours as needed for shortness of breath or wheezing.)   risperiDONE (RISPERDAL) 1 MG tablet Take 1 mg by mouth.   traZODone (DESYREL) 50 MG tablet Take 50 mg by mouth at bedtime.   triamcinolone cream (KENALOG) 0.5 % Apply 1 application topically 3 (three) times daily. (Patient taking differently: Apply 1 application topically 3 (three) times daily as needed (irritation).)   No facility-administered encounter medications on file as of 12/02/2020.   1. Furuncle of left axilla Trial Keflex.  Patient education given on supportive care Red flags for prompt reevaluation - cephALEXin (KEFLEX) 500 MG capsule; Take 1 capsule (500 mg total) by mouth 4 (four) times daily for 5 days.  Dispense: 20 capsule; Refill: 0  2. Furuncle of right  hip  - cephALEXin (KEFLEX) 500 MG capsule; Take 1 capsule (500 mg total) by mouth 4 (four) times daily for 5 days.  Dispense: 20 capsule; Refill: 0  3. Hypotension, unspecified hypotension type Patient encouraged to check blood pressure at home, keep a  written log and have available for all office visits.  Red flags given for prompt reevaluation.  4. Alcohol abuse Currently in residential treatment center  5. Methamphetamine abuse (HCC)   6. Opioid abuse (HCC)   7. Schizophrenia, unspecified type (HCC) Continue current regimen, no refill needed today   I have reviewed the patient's medical history (PMH, PSH, Social History, Family History, Medications, and allergies) , and have been updated if relevant. I spent 30 minutes reviewing chart and  face to face time with patient.    Follow-up: Return if symptoms worsen or fail to improve.   Kasandra Knudsen Mayers, PA-C

## 2020-12-03 DIAGNOSIS — F1510 Other stimulant abuse, uncomplicated: Secondary | ICD-10-CM | POA: Insufficient documentation

## 2020-12-03 DIAGNOSIS — F209 Schizophrenia, unspecified: Secondary | ICD-10-CM | POA: Insufficient documentation

## 2020-12-03 DIAGNOSIS — F251 Schizoaffective disorder, depressive type: Secondary | ICD-10-CM | POA: Insufficient documentation

## 2020-12-03 DIAGNOSIS — I959 Hypotension, unspecified: Secondary | ICD-10-CM | POA: Insufficient documentation

## 2020-12-23 ENCOUNTER — Ambulatory Visit: Payer: Medicaid Other | Admitting: Physician Assistant

## 2020-12-23 ENCOUNTER — Other Ambulatory Visit: Payer: Self-pay

## 2020-12-23 VITALS — BP 107/48 | HR 83 | Temp 98.5°F | Ht 63.0 in | Wt 136.0 lb

## 2020-12-23 DIAGNOSIS — Z114 Encounter for screening for human immunodeficiency virus [HIV]: Secondary | ICD-10-CM

## 2020-12-23 DIAGNOSIS — F5104 Psychophysiologic insomnia: Secondary | ICD-10-CM | POA: Diagnosis not present

## 2020-12-23 DIAGNOSIS — Z79899 Other long term (current) drug therapy: Secondary | ICD-10-CM

## 2020-12-23 DIAGNOSIS — L02422 Furuncle of left axilla: Secondary | ICD-10-CM | POA: Diagnosis not present

## 2020-12-23 DIAGNOSIS — F209 Schizophrenia, unspecified: Secondary | ICD-10-CM

## 2020-12-23 DIAGNOSIS — I959 Hypotension, unspecified: Secondary | ICD-10-CM

## 2020-12-23 DIAGNOSIS — Z9189 Other specified personal risk factors, not elsewhere classified: Secondary | ICD-10-CM

## 2020-12-23 DIAGNOSIS — Z1159 Encounter for screening for other viral diseases: Secondary | ICD-10-CM

## 2020-12-23 DIAGNOSIS — J4520 Mild intermittent asthma, uncomplicated: Secondary | ICD-10-CM

## 2020-12-23 NOTE — Progress Notes (Signed)
Patient has eaten and taken medication today. Patient denies pain this time. Patient reports boil under the left armpit is much improved. Patient denies dizziness, syncope, headache or nausea with hypotension episodes.

## 2020-12-23 NOTE — Patient Instructions (Signed)
I do encourage you to make sure you are drinking lots of water on a daily basis, continue checking your blood pressure on a daily basis.  I have started a referral for you to be seen by psychiatry for medication management.  We will call you with today's lab results.  Roney Jaffe, PA-C Physician Assistant [facility] [email or web address]   Hypotension As your heart beats, it forces blood through your body. Hypotension, commonly called low blood pressure, is when the force of blood pumping through your arteries is too weak. Arteries are blood vessels that carry blood from the heart throughout the body. Depending on the cause and severity, hypotension may be harmless (benign) or may cause serious problems (be critical). When blood pressure is too low, you may not get enough blood to your brain or to the rest of your organs. This can cause weakness, light-headedness, rapid heartbeat, and fainting. What are the causes? This condition may be caused by: Blood loss. Loss of body fluids (dehydration). Heart problems. Hormone (endocrine) problems. Pregnancy. Severe infection. Lack of certain nutrients. Severe allergic reactions (anaphylaxis). Certain medicines, such as blood pressure medicine or medicines that make the body lose excess fluids (diuretics). Sometimes, hypotension may be caused by not taking medicine as directed, such as taking too much of a certain medicine. What increases the risk? The following factors may make you more likely to develop this condition: Age. Risk increases as you get older. Conditions that affect the heart or the central nervous system. Taking certain medicines, such as blood pressure medicine or diuretics. Being pregnant. What are the signs or symptoms? Common symptoms of this condition include: Weakness. Light-headedness. Dizziness. Blurred vision. Fatigue. Rapid heartbeat. Fainting, in severe  cases. How is this diagnosed? This condition is diagnosed based on: Your medical history. Your symptoms. Your blood pressure measurement. Your health care provider will check your blood pressure when you are: Lying down. Sitting. Standing. A blood pressure reading is recorded as two numbers, such as "120 over 80" (or 120/80). The first ("top") number is called the systolic pressure. It is a measure of the pressure in your arteries as your heart beats. The second ("bottom") number is called the diastolic pressure. It is a measure of the pressure in your arteries when your heart relaxes between beats. Blood pressure is measured in a unit called mm Hg. Healthy blood pressure for most adults is 120/80. If your blood pressure is below 90/60, you may be diagnosed with hypotension. Other information or tests that may be used to diagnose hypotension include: Your other vital signs, such as your heart rate and temperature. Blood tests. Tilt table test. For this test, you will be safely secured to a table that moves you from a lying position to an upright position. Your heart rhythm and blood pressure will be monitored during the test. How is this treated? Treatment for this condition may include: Changing your diet. This may involve eating more salt (sodium) or drinking more water. Taking medicines to raise your blood pressure. Changing the dosage of certain medicines you are taking that might be lowering your blood pressure. Wearing compression stockings. These stockings help to prevent blood clots and reduce swelling in your legs. In some cases, you may need to go to the hospital for: Fluid replacement. This means you will receive fluids through an IV. Blood replacement. This means you will receive donated blood through an IV (transfusion). Treating an infection or heart problems, if this applies. Monitoring. You may  need to be monitored while medicines that you are taking wear off. Follow these  instructions at home: Eating and drinking  Drink enough fluid to keep your urine pale yellow. Eat a healthy diet, and follow instructions from your health care provider about eating or drinking restrictions. A healthy diet includes: Fresh fruits and vegetables. Whole grains. Lean meats. Low-fat dairy products. Eat extra salt only as directed. Do not add extra salt to your diet unless your health care provider told you to do that. Eat frequent, small meals. Avoid standing up suddenly after eating. Medicines Take over-the-counter and prescription medicines only as told by your health care provider. Follow instructions from your health care provider about changing the dosage of your current medicines, if this applies. Do not stop or adjust any of your medicines on your own. General instructions  Wear compression stockings as told by your health care provider. Get up slowly from lying down or sitting positions. This gives your blood pressure a chance to adjust. Avoid hot showers and excessive heat as directed by your health care provider. Return to your normal activities as told by your health care provider. Ask your health care provider what activities are safe for you. Do not use any products that contain nicotine or tobacco, such as cigarettes, e-cigarettes, and chewing tobacco. If you need help quitting, ask your health care provider. Keep all follow-up visits as told by your health care provider. This is important. Contact a health care provider if you: Vomit. Have diarrhea. Have a fever for more than 2-3 days. Feel more thirsty than usual. Feel weak and tired. Get help right away if you: Have chest pain. Have a fast or irregular heartbeat. Develop numbness in any part of your body. Cannot move your arms or your legs. Have trouble speaking. Become sweaty or feel light-headed. Faint. Feel short of breath. Have trouble staying awake. Feel confused. Summary Hypotension is when  the force of blood pumping through your arteries is too weak. Hypotension may be harmless (benign) or may cause serious problems (be critical). Treatment for this condition may include changing your diet, changing your medicines, and wearing compression stockings. In some cases, you may need to go to the hospital for fluid or blood replacement. This information is not intended to replace advice given to you by your health care provider. Make sure you discuss any questions you have with your health care provider. Document Revised: 09/16/2017 Document Reviewed: 09/16/2017 Elsevier Patient Education  2022 [employer].

## 2020-12-23 NOTE — Progress Notes (Signed)
Established Patient Office Visit  Subjective:  Patient ID: Olivia Faulkner, female    DOB: 03/30/1986  Age: 35 y.o. MRN: [id number]  CC:  Chief Complaint  Patient presents with   Hypotension     HPI Olivia Faulkner reports that she will be leaving [facility] tomorrow morning and be transitioning to [facility] in [place] to continue her substance abuse treatment program.  Reports that she has been checking her blood pressure twice a day, has had readings on the lower end of normal limits, and has had several readings approximately 89/57.  Reports that she does not recall having low blood pressure readings in the past.  Denies any hypotensive symptoms.  Reports that she was started on both Risperdal and Depakote 4 weeks ago, states that she has been taking trazodone for the past 2 years.  Reports that she has never taken Risperdal or Depakote in the past.  Does endorse that she feels they are offering relief  Reports that she did complete her treatment for left axilla furuncle with resolution.   Past Medical History:  Diagnosis Date   Allergic rhinitis    Anxiety    08/22/20- not current   Asthma    allergic   Depression    Eczema    Former smoker    History of chicken pox    History of kidney stones    never passed   Marijuana use    Migraine    MRSA (methicillin resistant staph aureus) culture positive    Pregnancy induced hypertension    with last pregnancy   Suboxone maintenance treatment complicating pregnancy, antepartum (HCC)    Vaginal Pap smear, abnormal    LGSIL    Past Surgical History:  Procedure Laterality Date   TUBAL LIGATION Bilateral 03/13/2017   Procedure: POST PARTUM TUBAL LIGATION;  Surgeon: Tilda Burrow, MD;  Location: [facility];  Service: Gynecology;  Laterality: Bilateral;   WISDOM TOOTH EXTRACTION      Family History  Problem Relation Age of Onset   Alcohol abuse Maternal Grandfather    Lung cancer Mother        Living    Hypertension Mother    COPD Mother    Cancer Mother    Heart disease Maternal Grandmother    Kidney disease Maternal Grandmother    Hypertension Maternal Grandmother    Lung cancer Maternal Grandmother    Diabetes Father        Living   Hypertension Father    Skin cancer Father    Heart disease Paternal Grandmother    Heart disease Paternal Grandfather    Alcohol abuse Maternal Aunt    COPD Maternal Aunt    Alcohol abuse Brother    Asthma Brother     Social History   Socioeconomic History   Marital status: Single    Spouse name: Not on file   Number of children: Not on file   Years of education: Not on file   Highest education level: Not on file  Occupational History   Not on file  Tobacco Use   Smoking status: Every Day    Packs/day: 0.50    Types: Cigarettes    Last attempt to quit: 10/04/2014    Years since quitting: 6.2   Smokeless tobacco: Never  Vaping Use   Vaping Use: Every day  Substance and Sexual Activity   Alcohol use: No   Drug use: Yes    Types: Marijuana    Comment:  suboxone, MJ last used 29mo ago 08/22/20- not using now   Sexual activity: Yes    Birth control/protection: None  Other Topics Concern   Not on file  Social History Narrative   Not on file   Social Determinants of Health   Financial Resource Strain: Not on file  Food Insecurity: Not on file  Transportation Needs: Not on file  Physical Activity: Not on file  Stress: Not on file  Social Connections: Not on file  Intimate Partner Violence: Not on file    Outpatient Medications Prior to Visit  Medication Sig Dispense Refill   divalproex (DEPAKOTE ER) 500 MG 24 hr tablet Take 500 mg by mouth.     PROVENTIL HFA 108 (90 Base) MCG/ACT inhaler INHALE ONE TO TWO PUFFS INTO LUNGS EVERY 6 HOURS AS NEEDED FOR WHEEZING FOR SHORTNESS OF BREATH (Patient taking differently: 1-2 puffs every 6 (six) hours as needed for shortness of breath or wheezing.) 7 each 0   risperiDONE (RISPERDAL) 1 MG  tablet Take 1 mg by mouth.     traZODone (DESYREL) 50 MG tablet Take 50 mg by mouth at bedtime.     triamcinolone cream (KENALOG) 0.5 % Apply 1 application topically 3 (three) times daily. (Patient taking differently: Apply 1 application topically 3 (three) times daily as needed (irritation).) 30 g 3   No facility-administered medications prior to visit.    Allergies  Allergen Reactions   Sulfa Antibiotics Hives and Itching    ROS Review of Systems  Constitutional:  Negative for chills and fever.  HENT: Negative.    Eyes: Negative.   Respiratory:  Negative for shortness of breath.   Cardiovascular:  Negative for chest pain.  Gastrointestinal: Negative.   Endocrine: Negative.   Genitourinary: Negative.   Musculoskeletal: Negative.   Skin: Negative.   Allergic/Immunologic: Negative.   Neurological:  Negative for dizziness, syncope, light-headedness and headaches.  Hematological: Negative.   Psychiatric/Behavioral:  Negative for dysphoric mood, self-injury, sleep disturbance and suicidal ideas. The patient is not nervous/anxious.      Objective:    Physical Exam Vitals and nursing note reviewed.  Constitutional:      Appearance: Normal appearance.  HENT:     Head: Normocephalic and atraumatic.     Right Ear: External ear normal.     Left Ear: External ear normal.     Nose: Nose normal.     Mouth/Throat:     Mouth: Mucous membranes are moist.     Pharynx: Oropharynx is clear.  Eyes:     Conjunctiva/sclera: Conjunctivae normal.     Pupils: Pupils are equal, round, and reactive to light.  Cardiovascular:     Rate and Rhythm: Normal rate and regular rhythm.     Pulses: Normal pulses.     Heart sounds: Normal heart sounds.  Pulmonary:     Effort: Pulmonary effort is normal.     Breath sounds: Normal breath sounds.  Musculoskeletal:        General: Normal range of motion.     Cervical back: Normal range of motion and neck supple.  Skin:    General: Skin is warm and  dry.  Neurological:     General: No focal deficit present.     Mental Status: She is alert and oriented to person, place, and time.  Psychiatric:        Mood and Affect: Mood normal.        Behavior: Behavior normal.        Thought  Content: Thought content normal.        Judgment: Judgment normal.    BP (!) 107/48 (BP Location: Left Arm, Patient Position: Sitting, Cuff Size: Normal)   Pulse 83   Temp 98.5 F (36.9 C) (Oral)   Ht 5\' 3"  (1.6 m)   Wt 136 lb (61.7 kg)   LMP 11/23/2020   SpO2 100%   BMI 24.09 kg/m  Wt Readings from Last 3 Encounters:  12/23/20 136 lb (61.7 kg)  12/02/20 122 lb (55.3 kg)  04/23/19 130 lb (59 kg)     Health Maintenance Due  Topic Date Due   COVID-19 Vaccine (1) Never done   Hepatitis C Screening  Never done   PAP SMEAR-Modifier  08/15/2019   INFLUENZA VACCINE  11/04/2020    There are no preventive care reminders to display for this patient.  Lab Results  Component Value Date   TSH 1.918 04/16/2011   Lab Results  Component Value Date   WBC 19.7 (H) 04/23/2019   HGB 14.1 04/23/2019   HCT 41.3 04/23/2019   MCV 85.2 04/23/2019   PLT 325 04/23/2019   Lab Results  Component Value Date   NA 141 04/23/2019   K 3.4 (L) 04/23/2019   CO2 24 04/23/2019   GLUCOSE 129 (H) 04/23/2019   BUN 19 04/23/2019   CREATININE 0.87 04/23/2019   BILITOT 0.9 04/23/2019   ALKPHOS 75 04/23/2019   AST 23 04/23/2019   ALT 20 04/23/2019   PROT 8.0 04/23/2019   ALBUMIN 5.1 (H) 04/23/2019   CALCIUM 10.2 04/23/2019   ANIONGAP 14 04/23/2019   No results found for: CHOL No results found for: HDL No results found for: LDLCALC No results found for: TRIG No results found for: CHOLHDL No results found for: 04/25/2019    Assessment & Plan:   Problem List Items Addressed This Visit       Cardiovascular and Mediastinum   Hypotension     Other   Schizophrenia (HCC)   Relevant Orders   Ambulatory referral to Psychiatry   Other Visit Diagnoses      Furuncle of left axilla    -  Primary   Mild intermittent asthma without complication       Psychophysiological insomnia       Long term current use of antipsychotic medication       Relevant Orders   Comp. Metabolic Panel (12)   Lipid panel   Valproic acid level   Prolactin   Encounter for HCV screening test for high risk patient       Relevant Orders   HCV Ab w Reflex to Quant PCR   Encounter for screening for HIV       Relevant Orders   HIV antibody (with reflex)       No orders of the defined types were placed in this encounter. 1. Hypotension, unspecified hypotension type Patient recently started Risperdal and Depakote, both have possibility of adverse effect of hypotension, patient is transitioning to [facility] house, refer to psychiatry for medication management.  Patient education given on hypotensive symptoms, red flags for prompt reevaluation  2. Furuncle of left axilla Resolved  3. Schizophrenia, unspecified type (HCC)  - Ambulatory referral to Psychiatry  4. Psychophysiological insomnia   5. Long term current use of antipsychotic medication Patient has upcoming appointment with [facility] on January 28, 2021 to establish care.  Patient encouraged to return to mobile unit to complete fasting lab. - Comp. Metabolic Panel (12) - Lipid panel;  Future - Valproic acid level - Prolactin  6. Encounter for HCV screening test for high risk patient  - HCV Ab w Reflex to Quant PCR  7. Encounter for screening for HIV  - HIV antibody (with reflex)   I have reviewed the patient's medical history (PMH, PSH, Social History, Family History, Medications, and allergies) , and have been updated if relevant. I spent 30 minutes reviewing chart and  face to face time with patient.    Follow-up: Return in about 5 weeks (around 01/28/2021) for with Dr. Andrey Campanile at [facility].    Kasandra Knudsen Mayers, PA-C

## 2020-12-24 DIAGNOSIS — L02422 Furuncle of left axilla: Secondary | ICD-10-CM | POA: Insufficient documentation

## 2020-12-24 DIAGNOSIS — F5104 Psychophysiologic insomnia: Secondary | ICD-10-CM | POA: Insufficient documentation

## 2020-12-25 LAB — COMP. METABOLIC PANEL (12)
AST: 20 IU/L (ref 0–40)
Albumin/Globulin Ratio: 2.6 — ABNORMAL HIGH (ref 1.2–2.2)
Albumin: 4.4 g/dL (ref 3.8–4.8)
Alkaline Phosphatase: 69 IU/L (ref 44–121)
BUN/Creatinine Ratio: 18 (ref 9–23)
BUN: 12 mg/dL (ref 6–20)
Bilirubin Total: 0.3 mg/dL (ref 0.0–1.2)
Calcium: 9.2 mg/dL (ref 8.7–10.2)
Chloride: 103 mmol/L (ref 96–106)
Creatinine, Ser: 0.65 mg/dL (ref 0.57–1.00)
Globulin, Total: 1.7 g/dL (ref 1.5–4.5)
Glucose: 92 mg/dL (ref 65–99)
Potassium: 4.2 mmol/L (ref 3.5–5.2)
Sodium: 141 mmol/L (ref 134–144)
Total Protein: 6.1 g/dL (ref 6.0–8.5)
eGFR: 119 mL/min/{1.73_m2} (ref >59)

## 2020-12-25 LAB — PROLACTIN: Prolactin: 119 ng/mL — ABNORMAL HIGH (ref 4.8–23.3)

## 2020-12-25 LAB — HCV AB W REFLEX TO QUANT PCR: HCV Ab: <0.1 s/co ratio (ref 0.0–0.9)

## 2020-12-25 LAB — VALPROIC ACID LEVEL: Valproic Acid Lvl: 27 ug/mL — ABNORMAL LOW (ref 50–100)

## 2020-12-25 LAB — HCV INTERPRETATION

## 2020-12-25 LAB — HIV ANTIBODY (ROUTINE TESTING W REFLEX): HIV Screen 4th Generation wRfx: NONREACTIVE

## 2021-01-01 ENCOUNTER — Telehealth: Payer: Self-pay | Admitting: *Deleted

## 2021-01-01 NOTE — Telephone Encounter (Signed)
Patient verified DOB Patient is aware of labs and screenings being normal and negative. Patient is aware of needing to take .5mg  of risperdal to address elevated prolactin levels. Patient is picking up medication from [facility] and [facility] currently. Patient will have levels rechecked at 10/25 visit.

## 2021-01-01 NOTE — Telephone Encounter (Signed)
-----   Message from Roney Jaffe, [place] sent at 12/25/2020  2:50 PM EDT ----- Please call patient and let her know that her kidney function and liver function are within normal limits.  Her screening for hepatitis C and HIV were negative.  She does have increased prolactin levels, this is due to the Risperdal, I do encourage her to start taking a smaller dose, 0.5 mg once daily and have these prolactin levels rechecked in 2-4 weeks.

## 2021-01-28 ENCOUNTER — Ambulatory Visit (INDEPENDENT_AMBULATORY_CARE_PROVIDER_SITE_OTHER): Payer: Medicaid Other | Admitting: Family Medicine

## 2021-01-28 ENCOUNTER — Other Ambulatory Visit: Payer: Self-pay

## 2021-01-28 ENCOUNTER — Encounter: Payer: Self-pay | Admitting: Family Medicine

## 2021-01-28 VITALS — BP 98/63 | HR 96 | Temp 97.7°F | Resp 16 | Ht 63.0 in | Wt 137.4 lb

## 2021-01-28 DIAGNOSIS — Z7689 Persons encountering health services in other specified circumstances: Secondary | ICD-10-CM

## 2021-01-28 DIAGNOSIS — F32A Depression, unspecified: Secondary | ICD-10-CM

## 2021-01-28 DIAGNOSIS — F1110 Opioid abuse, uncomplicated: Secondary | ICD-10-CM | POA: Diagnosis not present

## 2021-01-28 DIAGNOSIS — F419 Anxiety disorder, unspecified: Secondary | ICD-10-CM | POA: Diagnosis not present

## 2021-01-28 MED ORDER — RISPERIDONE 1 MG PO TABS: 1.0000 mg | ORAL_TABLET | Freq: Every day | ORAL | 0 refills | Status: DC

## 2021-01-28 NOTE — Progress Notes (Signed)
Patient is new here and need to est care Patient has no concerns today

## 2021-01-29 ENCOUNTER — Encounter: Payer: Self-pay | Admitting: Family Medicine

## 2021-01-29 NOTE — Progress Notes (Signed)
New Patient Office Visit  Subjective:  Patient ID: Olivia Faulkner, female    DOB: 08-11-1985  Age: 35 y.o. MRN: [id number]  CC:  Chief Complaint  Patient presents with   Establish Care    HPI [name] presents for to establish care. Patient reports that she has been [place] and was discharged without availability to providers/meds. She reports that she may have been confused as to where she needed to go for appts.   Past Medical History:  Diagnosis Date   Allergic rhinitis    Anxiety    08/22/20- not current   Asthma    allergic   Depression    Eczema    Former smoker    History of chicken pox    History of kidney stones    never passed   Marijuana use    Migraine    MRSA (methicillin resistant staph aureus) culture positive    Pregnancy induced hypertension    with last pregnancy   Suboxone maintenance treatment complicating pregnancy, antepartum (HCC)    Vaginal Pap smear, abnormal    LGSIL    Past Surgical History:  Procedure Laterality Date   TUBAL LIGATION Bilateral 03/13/2017   Procedure: POST PARTUM TUBAL LIGATION;  Surgeon: Tilda Burrow, MD;  Location: [facility];  Service: Gynecology;  Laterality: Bilateral;   WISDOM TOOTH EXTRACTION      Family History  Problem Relation Age of Onset   Alcohol abuse Maternal Grandfather    Lung cancer Mother        Living   Hypertension Mother    COPD Mother    Cancer Mother    Heart disease Maternal Grandmother    Kidney disease Maternal Grandmother    Hypertension Maternal Grandmother    Lung cancer Maternal Grandmother    Diabetes Father        Living   Hypertension Father    Skin cancer Father    Heart disease Paternal Grandmother    Heart disease Paternal Grandfather    Alcohol abuse Maternal Aunt    COPD Maternal Aunt    Alcohol abuse Brother    Asthma Brother     Social History   Socioeconomic History   Marital status: Single    Spouse name: Not on file   Number of  children: Not on file   Years of education: Not on file   Highest education level: Not on file  Occupational History   Not on file  Tobacco Use   Smoking status: Every Day    Packs/day: 0.50    Types: Cigarettes    Last attempt to quit: 10/04/2014    Years since quitting: 6.3   Smokeless tobacco: Never  Vaping Use   Vaping Use: Every day  Substance and Sexual Activity   Alcohol use: No   Drug use: Yes    Types: Marijuana    Comment: suboxone, MJ last used 41mo ago 08/22/20- not using now   Sexual activity: Yes    Birth control/protection: None  Other Topics Concern   Not on file  Social History Narrative   Not on file   Social Determinants of Health   Financial Resource Strain: Not on file  Food Insecurity: Not on file  Transportation Needs: Not on file  Physical Activity: Not on file  Stress: Not on file  Social Connections: Not on file  Intimate Partner Violence: Not on file    ROS Review of Systems  Objective:   Today's  Vitals: BP 98/63   Pulse 96   Temp 97.7 F (36.5 C) (Oral)   Resp 16   Ht 5\' 3"  (1.6 m)   Wt 137 lb 6.4 oz (62.3 kg)   BMI 24.34 kg/m   Physical Exam Vitals and nursing note reviewed.  Constitutional:      General: She is not in acute distress. Cardiovascular:     Rate and Rhythm: Normal rate and regular rhythm.  Pulmonary:     Effort: Pulmonary effort is normal.     Breath sounds: Normal breath sounds.  Neurological:     General: No focal deficit present.     Mental Status: She is alert and oriented to person, place, and time.  Psychiatric:        Mood and Affect: Mood normal.        Behavior: Behavior normal.    Assessment & Plan:   1. Opioid abuse [facility]) Patient to follow up as recommended with BH  2. Anxiety and depression Risperdal refilled. Patient to follow up as recommended with BH  3. Encounter to establish care     Outpatient Encounter Medications as of 01/28/2021  Medication Sig   PROVENTIL HFA 108 (90 Base)  MCG/ACT inhaler INHALE ONE TO TWO PUFFS INTO LUNGS EVERY 6 HOURS AS NEEDED FOR WHEEZING FOR SHORTNESS OF BREATH (Patient taking differently: 1-2 puffs every 6 (six) hours as needed for shortness of breath or wheezing.)   traZODone (DESYREL) 50 MG tablet Take 50 mg by mouth at bedtime.   triamcinolone cream (KENALOG) 0.5 % Apply 1 application topically 3 (three) times daily. (Patient taking differently: Apply 1 application topically 3 (three) times daily as needed (irritation).)   divalproex (DEPAKOTE ER) 500 MG 24 hr tablet Take 500 mg by mouth.   risperiDONE (RISPERDAL) 1 MG tablet Take 1 tablet (1 mg total) by mouth daily.   [DISCONTINUED] risperiDONE (RISPERDAL) 1 MG tablet Take 1 mg by mouth.   No facility-administered encounter medications on file as of 01/28/2021.    Follow-up: No follow-ups on file.   01/30/2021, MD

## 2021-02-26 ENCOUNTER — Other Ambulatory Visit: Payer: Self-pay | Admitting: Family Medicine

## 2021-03-03 NOTE — H&P (Addendum)
Patient: Olivia Faulkner  PID: [id number]  DOB: 14-Feb-1986  SEX: Female   Patient referred by general dentist  for extraction all remaining teeth  CC: Dental pain.  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 Multiple caries teeth # 3, 4, 5, 6, 7, 8, 9, 10. 11. 12. 13. 14. 20. 21, 22, 23, 24, 25, 26, 27, 28, 29.  No purulence, edema, fluctuance, trismus. Oral cancer screening negative. Pharynx clear. No lymphadenopathy. Cor- RRR, Lungs- Clear.   Panorex:Multiple caries teeth # 3, 4, 5, 6, 7, 8, 9, 10. 11. 12. 13. 14. 20. 21, 22, 23, 24, 25, 26, 27, 28, 29.   Assessment: ASA 2. Non-restorable  teeth # 3, 4, 5, 6, 7, 8, 9, 10. 11. 12. 13. 14. 20. 21, 22, 23, 24, 25, 26, 27, 28, 29.              Plan: Extraction Teeth # 3, 4, 5, 6, 7, 8, 9, 10. 11. 12. 13. 14. 20. 21, 22, 23, 24, 25, 26, 27, 28, 29.    Hospital Day surgery.                 Rx: n               Risks and complications explained. Questions answered.   Georgia Lopes, DMD

## 2021-03-05 ENCOUNTER — Other Ambulatory Visit: Payer: Self-pay

## 2021-03-05 ENCOUNTER — Encounter (HOSPITAL_COMMUNITY): Payer: Self-pay | Admitting: Oral Surgery

## 2021-03-05 NOTE — Progress Notes (Signed)
PCP - Dr. Gabriel Earing  Cardiologist - Denies  EP-Denies  Endocrine-Denies  Pulm-Denies  Chest x-ray - Denies  EKG - Denies  Stress Test - Denies  ECHO - Denies  Cardiac Cath - Denies  AICD-na PM-na LOOP-na  Dialysis-Denies  Sleep Study - Denies CPAP - Denies  LABS- 03/07/21: CBC, CMP, POC UPreg  ASA-Denies  ERAS- No  HA1C-Denies  Anesthesia- No  Pt denies having chest pain, sob, or fever during the pre-op phone call. All instructions explained to the pt, with a verbal understanding of the material including: as of today, stop taking all Aspirin (unless instructed by your doctor) and Other Aspirin containing products, Vitamins, Fish oils, and Herbal medications. Also stop all NSAIDS i.e. Advil, Ibuprofen, Motrin, Aleve, Anaprox, Naproxen, BC, Goody Powders, and all Supplements.  Pt also instructed to wear a mask and social distance if she goes out. The opportunity to ask questions was provided.    Coronavirus Screening  Have you experienced the following symptoms:  Cough yes/no: No Fever (>100.36F)  yes/no: No Runny nose yes/no: No Sore throat yes/no: No Difficulty breathing/shortness of breath  yes/no: No  Have you or a family member traveled in the last 14 days and where? yes/no: No   If the patient indicates "YES" to the above questions, their PAT will be rescheduled to limit the exposure to others and, the surgeon will be notified. THE PATIENT WILL NEED TO BE ASYMPTOMATIC FOR 14 DAYS.   If the patient is not experiencing any of these symptoms, the PAT nurse will instruct them to NOT bring anyone with them to their appointment since they may have these symptoms or traveled as well.   Please remind your patients and families that hospital visitation restrictions are in effect and the importance of the restrictions.

## 2021-03-07 ENCOUNTER — Other Ambulatory Visit: Payer: Self-pay

## 2021-03-07 ENCOUNTER — Telehealth: Payer: Self-pay | Admitting: *Deleted

## 2021-03-07 ENCOUNTER — Ambulatory Visit (HOSPITAL_COMMUNITY): Payer: Medicaid Other | Admitting: Anesthesiology

## 2021-03-07 ENCOUNTER — Ambulatory Visit (HOSPITAL_COMMUNITY)
Admission: RE | Admit: 2021-03-07 | Discharge: 2021-03-07 | Disposition: A | Discharge status: Home / Self Care | Payer: Medicaid Other | Attending: Oral Surgery | Admitting: Oral Surgery

## 2021-03-07 ENCOUNTER — Encounter (HOSPITAL_COMMUNITY)
Admission: RE | Disposition: A | Discharge status: Home / Self Care | Payer: Self-pay | Source: Home / Self Care | Attending: Oral Surgery

## 2021-03-07 ENCOUNTER — Encounter (HOSPITAL_COMMUNITY): Payer: Self-pay | Admitting: Oral Surgery

## 2021-03-07 DIAGNOSIS — K056 Periodontal disease, unspecified: Secondary | ICD-10-CM | POA: Insufficient documentation

## 2021-03-07 DIAGNOSIS — F17200 Nicotine dependence, unspecified, uncomplicated: Secondary | ICD-10-CM | POA: Insufficient documentation

## 2021-03-07 DIAGNOSIS — J45909 Unspecified asthma, uncomplicated: Secondary | ICD-10-CM | POA: Diagnosis not present

## 2021-03-07 DIAGNOSIS — K029 Dental caries, unspecified: Secondary | ICD-10-CM | POA: Diagnosis not present

## 2021-03-07 DIAGNOSIS — I10 Essential (primary) hypertension: Secondary | ICD-10-CM | POA: Insufficient documentation

## 2021-03-07 HISTORY — PX: MULTIPLE EXTRACTIONS WITH ALVEOLOPLASTY: SHX5342

## 2021-03-07 LAB — COMPREHENSIVE METABOLIC PANEL
ALT: 11 U/L (ref 0–44)
AST: 13 U/L — ABNORMAL LOW (ref 15–41)
Albumin: 4.1 g/dL (ref 3.5–5.0)
Alkaline Phosphatase: 50 U/L (ref 38–126)
Anion gap: 5 (ref 5–15)
BUN: 15 mg/dL (ref 6–20)
CO2: 28 mmol/L (ref 22–32)
Calcium: 9.4 mg/dL (ref 8.9–10.3)
Chloride: 106 mmol/L (ref 98–111)
Creatinine, Ser: 0.88 mg/dL (ref 0.44–1.00)
GFR, Estimated: >60 mL/min (ref >60)
Glucose, Bld: 86 mg/dL (ref 70–99)
Potassium: 3.7 mmol/L (ref 3.5–5.1)
Sodium: 139 mmol/L (ref 135–145)
Total Bilirubin: 0.8 mg/dL (ref 0.3–1.2)
Total Protein: 6.4 g/dL — ABNORMAL LOW (ref 6.5–8.1)

## 2021-03-07 LAB — CBC
HCT: 40.4 % (ref 36.0–46.0)
Hemoglobin: 13.4 g/dL (ref 12.0–15.0)
MCH: 30.7 pg (ref 26.0–34.0)
MCHC: 33.2 g/dL (ref 30.0–36.0)
MCV: 92.4 fL (ref 80.0–100.0)
Platelets: 199 10*3/uL (ref 150–400)
RBC: 4.37 MIL/uL (ref 3.87–5.11)
RDW: 12 % (ref 11.5–15.5)
WBC: 6 10*3/uL (ref 4.0–10.5)
nRBC: 0 % (ref 0.0–0.2)

## 2021-03-07 LAB — POCT PREGNANCY, URINE: Preg Test, Ur: NEGATIVE

## 2021-03-07 SURGERY — MULTIPLE EXTRACTION WITH ALVEOLOPLASTY
Anesthesia: General | Site: Mouth | Laterality: Bilateral

## 2021-03-07 MED ORDER — LIDOCAINE-EPINEPHRINE 2 %-1:100000 IJ SOLN
INTRAMUSCULAR | Status: DC | PRN
  Administered 2021-03-07: 13 mL

## 2021-03-07 MED ORDER — FENTANYL CITRATE (PF) 250 MCG/5ML IJ SOLN
INTRAMUSCULAR | Status: DC | PRN
  Administered 2021-03-07: 125 ug via INTRAVENOUS
  Administered 2021-03-07: 75 ug via INTRAVENOUS

## 2021-03-07 MED ORDER — PROPOFOL 10 MG/ML IV BOLUS
INTRAVENOUS | Status: AC
  Filled 2021-03-07: qty 20

## 2021-03-07 MED ORDER — OXYCODONE HCL 5 MG/5ML PO SOLN: 5.0000 mg | Freq: Once | ORAL | Status: DC | PRN

## 2021-03-07 MED ORDER — LIDOCAINE 2% (20 MG/ML) 5 ML SYRINGE
INTRAMUSCULAR | Status: AC
  Filled 2021-03-07: qty 5

## 2021-03-07 MED ORDER — LIDOCAINE-EPINEPHRINE 2 %-1:100000 IJ SOLN
INTRAMUSCULAR | Status: AC
  Filled 2021-03-07: qty 1

## 2021-03-07 MED ORDER — PROPOFOL 10 MG/ML IV BOLUS
INTRAVENOUS | Status: DC | PRN
  Administered 2021-03-07: 160 mg via INTRAVENOUS

## 2021-03-07 MED ORDER — DEXAMETHASONE SODIUM PHOSPHATE 10 MG/ML IJ SOLN
INTRAMUSCULAR | Status: DC | PRN
  Administered 2021-03-07: 10 mg via INTRAVENOUS

## 2021-03-07 MED ORDER — PHENYLEPHRINE 40 MCG/ML (10ML) SYRINGE FOR IV PUSH (FOR BLOOD PRESSURE SUPPORT)
PREFILLED_SYRINGE | INTRAVENOUS | Status: DC | PRN
  Administered 2021-03-07 (×2): 120 ug via INTRAVENOUS
  Administered 2021-03-07: 80 ug via INTRAVENOUS
  Administered 2021-03-07: 160 ug via INTRAVENOUS
  Administered 2021-03-07: 120 ug via INTRAVENOUS
  Administered 2021-03-07: 80 ug via INTRAVENOUS

## 2021-03-07 MED ORDER — ACETAMINOPHEN 160 MG/5ML PO SOLN: 1000.0000 mg | Freq: Once | ORAL | Status: DC | PRN

## 2021-03-07 MED ORDER — OXYMETAZOLINE HCL 0.05 % NA SOLN
NASAL | Status: DC | PRN
  Administered 2021-03-07: 2 via NASAL

## 2021-03-07 MED ORDER — MIDAZOLAM HCL 5 MG/5ML IJ SOLN
INTRAMUSCULAR | Status: DC | PRN
  Administered 2021-03-07: 2 mg via INTRAVENOUS

## 2021-03-07 MED ORDER — CHLORHEXIDINE GLUCONATE 0.12 % MT SOLN
15.0000 mL | Freq: Once | OROMUCOSAL | Status: AC
  Administered 2021-03-07: 15 mL via OROMUCOSAL
  Filled 2021-03-07: qty 15

## 2021-03-07 MED ORDER — OXYMETAZOLINE HCL 0.05 % NA SOLN
NASAL | Status: DC | PRN
  Administered 2021-03-07: 1

## 2021-03-07 MED ORDER — MIDAZOLAM HCL 2 MG/2ML IJ SOLN
INTRAMUSCULAR | Status: AC
  Filled 2021-03-07: qty 2

## 2021-03-07 MED ORDER — OXYCODONE HCL 5 MG PO TABS: 5.0000 mg | ORAL_TABLET | Freq: Once | ORAL | Status: DC | PRN

## 2021-03-07 MED ORDER — FENTANYL CITRATE (PF) 250 MCG/5ML IJ SOLN
INTRAMUSCULAR | Status: AC
  Filled 2021-03-07: qty 5

## 2021-03-07 MED ORDER — PHENYLEPHRINE 40 MCG/ML (10ML) SYRINGE FOR IV PUSH (FOR BLOOD PRESSURE SUPPORT)
PREFILLED_SYRINGE | INTRAVENOUS | Status: AC
  Filled 2021-03-07: qty 20

## 2021-03-07 MED ORDER — SUGAMMADEX SODIUM 200 MG/2ML IV SOLN
INTRAVENOUS | Status: DC | PRN
  Administered 2021-03-07: 150 mg via INTRAVENOUS

## 2021-03-07 MED ORDER — ONDANSETRON HCL 4 MG/2ML IJ SOLN
INTRAMUSCULAR | Status: DC | PRN
  Administered 2021-03-07: 4 mg via INTRAVENOUS

## 2021-03-07 MED ORDER — KETOROLAC TROMETHAMINE 30 MG/ML IJ SOLN
INTRAMUSCULAR | Status: AC
  Filled 2021-03-07: qty 1

## 2021-03-07 MED ORDER — ROCURONIUM BROMIDE 10 MG/ML (PF) SYRINGE
PREFILLED_SYRINGE | INTRAVENOUS | Status: AC
  Filled 2021-03-07: qty 10

## 2021-03-07 MED ORDER — ACETAMINOPHEN 10 MG/ML IV SOLN: 1000.0000 mg | Freq: Once | INTRAVENOUS | Status: DC | PRN

## 2021-03-07 MED ORDER — SODIUM CHLORIDE 0.9 % IV SOLN
INTRAVENOUS | Status: AC | PRN
  Administered 2021-03-07: 1000 mL

## 2021-03-07 MED ORDER — ACETAMINOPHEN 500 MG PO TABS: 1000.0000 mg | ORAL_TABLET | Freq: Once | ORAL | Status: DC | PRN

## 2021-03-07 MED ORDER — LACTATED RINGERS IV SOLN: INTRAVENOUS | Status: DC

## 2021-03-07 MED ORDER — FENTANYL CITRATE (PF) 100 MCG/2ML IJ SOLN
25.0000 ug | INTRAMUSCULAR | Status: DC | PRN
  Administered 2021-03-07 (×2): 50 ug via INTRAVENOUS

## 2021-03-07 MED ORDER — DEXAMETHASONE SODIUM PHOSPHATE 10 MG/ML IJ SOLN
INTRAMUSCULAR | Status: AC
  Filled 2021-03-07: qty 1

## 2021-03-07 MED ORDER — ORAL CARE MOUTH RINSE: 15.0000 mL | Freq: Once | OROMUCOSAL | Status: AC

## 2021-03-07 MED ORDER — ROCURONIUM BROMIDE 10 MG/ML (PF) SYRINGE
PREFILLED_SYRINGE | INTRAVENOUS | Status: DC | PRN
  Administered 2021-03-07: 60 mg via INTRAVENOUS

## 2021-03-07 MED ORDER — CEFAZOLIN SODIUM-DEXTROSE 2-4 GM/100ML-% IV SOLN
2.0000 g | INTRAVENOUS | Status: AC
  Administered 2021-03-07: 2 g via INTRAVENOUS
  Filled 2021-03-07: qty 100

## 2021-03-07 MED ORDER — LIDOCAINE 2% (20 MG/ML) 5 ML SYRINGE
INTRAMUSCULAR | Status: DC | PRN
  Administered 2021-03-07: 50 mg via INTRAVENOUS

## 2021-03-07 MED ORDER — OXYMETAZOLINE HCL 0.05 % NA SOLN
NASAL | Status: AC
  Filled 2021-03-07: qty 120

## 2021-03-07 MED ORDER — KETOROLAC TROMETHAMINE 30 MG/ML IJ SOLN
30.0000 mg | Freq: Once | INTRAMUSCULAR | Status: AC
  Administered 2021-03-07: 30 mg via INTRAVENOUS

## 2021-03-07 MED ORDER — FENTANYL CITRATE (PF) 100 MCG/2ML IJ SOLN
INTRAMUSCULAR | Status: AC
  Filled 2021-03-07: qty 2

## 2021-03-07 MED ORDER — ONDANSETRON HCL 4 MG/2ML IJ SOLN
INTRAMUSCULAR | Status: AC
  Filled 2021-03-07: qty 2

## 2021-03-07 MED ORDER — OXYCODONE-ACETAMINOPHEN 5-325 MG PO TABS: 1.0000 | ORAL_TABLET | ORAL | 0 refills | Status: AC | PRN

## 2021-03-07 MED ORDER — 0.9 % SODIUM CHLORIDE (POUR BTL) OPTIME
TOPICAL | Status: DC | PRN
  Administered 2021-03-07: 1000 mL

## 2021-03-07 MED ORDER — AMOXICILLIN 500 MG PO CAPS: 500.0000 mg | ORAL_CAPSULE | Freq: Three times a day (TID) | ORAL | 0 refills | Status: DC

## 2021-03-07 SURGICAL SUPPLY — 39 items
BAG COUNTER SPONGE SURGICOUNT (BAG) ×2 IMPLANT
BAG SURGICOUNT SPONGE COUNTING (BAG) ×1
BUR CROSS CUT FISSURE 1.6 (BURR) ×2 IMPLANT
BUR CROSS CUT FISSURE 1.6MM (BURR) ×1
BUR EGG ELITE 4.0 (BURR) ×2 IMPLANT
BUR EGG ELITE 4.0MM (BURR) ×1
CANISTER SUCT 3000ML PPV (MISCELLANEOUS) ×3 IMPLANT
COVER SURGICAL LIGHT HANDLE (MISCELLANEOUS) ×3 IMPLANT
DECANTER SPIKE VIAL GLASS SM (MISCELLANEOUS) IMPLANT
DRAPE U-SHAPE 76X120 STRL (DRAPES) ×3 IMPLANT
GAUZE 4X4 16PLY ~~LOC~~+RFID DBL (SPONGE) ×2 IMPLANT
GAUZE PACKING FOLDED 2  STR (GAUZE/BANDAGES/DRESSINGS) ×3
GAUZE PACKING FOLDED 2 STR (GAUZE/BANDAGES/DRESSINGS) ×1 IMPLANT
GLOVE SURG ENC MOIS LTX SZ6.5 (GLOVE) IMPLANT
GLOVE SURG ENC MOIS LTX SZ8 (GLOVE) ×3 IMPLANT
GLOVE SURG UNDER POLY LF SZ6.5 (GLOVE) IMPLANT
GLOVE SURG UNDER POLY LF SZ7 (GLOVE) IMPLANT
GOWN STRL REUS W/ TWL LRG LVL3 (GOWN DISPOSABLE) ×1 IMPLANT
GOWN STRL REUS W/ TWL XL LVL3 (GOWN DISPOSABLE) ×1 IMPLANT
GOWN STRL REUS W/TWL LRG LVL3 (GOWN DISPOSABLE) ×3
GOWN STRL REUS W/TWL XL LVL3 (GOWN DISPOSABLE) ×3
IV NS 1000ML (IV SOLUTION) ×3
IV NS 1000ML BAXH (IV SOLUTION) ×1 IMPLANT
KIT BASIN OR (CUSTOM PROCEDURE TRAY) ×3 IMPLANT
KIT TURNOVER KIT B (KITS) ×3 IMPLANT
NDL HYPO 25GX1X1/2 BEV (NEEDLE) ×2 IMPLANT
NDL PRECISIONGLIDE 27X1.5 (NEEDLE) IMPLANT
NEEDLE HYPO 25GX1X1/2 BEV (NEEDLE) ×3 IMPLANT
NEEDLE PRECISIONGLIDE 27X1.5 (NEEDLE) IMPLANT
NS IRRIG 1000ML POUR BTL (IV SOLUTION) ×3 IMPLANT
PAD ARMBOARD 7.5X6 YLW CONV (MISCELLANEOUS) ×3 IMPLANT
POSITIONER HEAD DONUT 9IN (MISCELLANEOUS) ×3 IMPLANT
SLEEVE IRRIGATION ELITE 7 (MISCELLANEOUS) ×3 IMPLANT
SPONGE SURGIFOAM ABS GEL 12-7 (HEMOSTASIS) IMPLANT
SUT CHROMIC 3 0 PS 2 (SUTURE) ×5 IMPLANT
SYR CONTROL 10ML LL (SYRINGE) ×3 IMPLANT
TRAY ENT MC OR (CUSTOM PROCEDURE TRAY) ×3 IMPLANT
TUBING IRRIGATION (MISCELLANEOUS) ×3 IMPLANT
YANKAUER SUCT BULB TIP NO VENT (SUCTIONS) ×3 IMPLANT

## 2021-03-07 NOTE — Op Note (Signed)
NAME: Olivia Faulkner, Olivia A. MEDICAL RECORD NO: [id number] ACCOUNT NO: [id number] DATE OF BIRTH: 1985/06/06 FACILITY: [facility] LOCATION: [facility] PHYSICIAN: Georgia Lopes, DDS  Operative Report   DATE OF PROCEDURE: 03/07/2021  PREOPERATIVE DIAGNOSIS:  Nonrestorable teeth numbers 3, 4, 5, 6, 7, 8, 9, 10, 11, 12, 13, 14, 20, 21, 22, 23, 24, 25, 26, 27, 28, 29 secondary to dental caries and periodontal disease.  POSTOPERATIVE DIAGNOSIS:  Nonrestorable teeth numbers 3, 4, 5, 6, 7, 8, 9, 10, 11, 12, 13, 14, 20, 21, 22, 23, 24, 25, 26, 27, 28, 29 secondary to dental caries and periodontal disease.  PROCEDURE:  Extraction teeth numbers 3, 4, 5, 6, 7, 8, 9, 10, 11, 12, 13, 14, 20, 21, 22, 23, 24, 25, 26, 27, 28, 29, alveoplasty right and left maxilla and mandible.  SURGEON:  Ocie Doyne, MD  ANESTHESIA:  General.  Dr. Maple Hudson attending. Nasal intubation.  DESCRIPTION OF PROCEDURE:  The patient was on the table in supine position, intubated nasally.  The eyes were protected.  The tube was secured.  The patient was draped for surgery.  Timeout was performed.  The posterior pharynx was suctioned and a throat  pack was placed.  2% lidocaine 1:100,000 epinephrine was infiltrated in an inferior alveolar block on the right and left sides and in buccal and palatal infiltration in the maxilla around the teeth to be removed.  A bite block was placed on the right  side of the mouth.  A sweetheart retractor was used to retract the tongue.  A #15 blade was used to make an incision beginning 1 cm proximal to tooth #20 on the alveolar crest and then the incision was carried lingually in the gingival sulcus and across  the midline to tooth #27 and buccally and the gingival sulcus across the midline to tooth #27.  The periosteum was reflected from around these teeth.  The teeth were elevated with a 301 elevator.  Teeth numbers 20, 23, 24, 25 and 26 were removed in  simple fashion with the dental forceps. Tooth numbers 20 and  21 fractured upon attempted removal secondary to decay.  The Stryker handpiece with a fissure bur was then used under irrigation to remove circumferential bone from around these teeth.  The  teeth were elevated and removed with the dental forceps.  The sockets were curetted.  The periosteum was reflected to expose the alveolar crest, which had irregular contour owing to periodontal disease and fracture of teeth during extraction.  The egg  bur and the Stryker handpiece under irrigation was used to perform alveoplasty to smooth sharp edges and create a more smooth contour.  Then, a bone file was used to further smooth this area and then the incision was closed with 3-0 chromic. In the left  maxilla, the 15 blade was used to make an incision 1 cm proximal to tooth #14 on the alveolar crest, carried forward in the buccal and palatal sulcus across the midline until tooth #6 was encountered.  The periosteum was reflected from around these  teeth.  Teeth were elevated and removed from the mouth with a dental forceps.  The sockets were curetted.  The periosteum was reflected to expose the alveolar crest.  There were 2 small bulbous areas on the superior border of the alveolus in the molar  and canine area.  These required removal with alveoplasty in order to make a smooth contour, so that the denture could be fabricated after healing.  The alveoplasty was performed  using the egg bur under irrigation and the Stryker handpiece followed by  the bone file.  The area was irrigated and closed with 3-0 chromic.  Then, the bite block and sweetheart retractor were repositioned to the other side of the mouth.  A 15 blade was used to make an incision in the gingival and lingual sulcus of teeth  numbers 27, 28, 29 in the mandible and around teeth numbers 3, 4, 5 and 6 in the maxilla.  The periosteum was reflected.  The teeth were elevated and removed from the mouth with the dental forceps, after elevating with a 301 elevator,  then the sockets  were curetted, irrigated.  Tissue was trimmed and then the tissue was reflected to expose the alveolar crest.  There was irregular contour, which required alveoloplasty.  This was done with the egg bur followed by the bone file, then the area was  irrigated and closed with 3-0 chromic.  The oral cavity was then irrigated and suctioned.  The maxillary frenum was noted to have pulled down vertically with closure and would cause ill-fitting dentures, so that the frenum was released with a 15 blade  and sutured with 3-0 chromic gut and the oral cavity was irrigated and suctioned again.  The throat pack was removed.  The patient was left under the care of anesthesia for extubation and transport to recovery room with plans for discharge home through  day surgery.  ESTIMATED BLOOD LOSS:  Minimum.  COMPLICATIONS:  None.  SPECIMENS:  No specimens.   SHW D: 03/07/2021 8:32:18 am T: 03/07/2021 9:12:00 am  JOB: [id number]/ [id number]

## 2021-03-07 NOTE — Telephone Encounter (Signed)
Patient request medication refill risperiDONE (RISPERDAL) 1 MG tablet

## 2021-03-07 NOTE — Op Note (Signed)
03/07/2021  8:26 AM  PATIENT:  Olivia Faulkner  35 y.o. female  PRE-OPERATIVE DIAGNOSIS:  NONRESTORABLE TEETH# 3, 4, 5, 6, 7, 8, 9, 10, 11, 12, 13, 14, 20, 21, 22, 23, 24, 25, 26, 27, 28, 29 SECONDARY TO DENTAL CARIES, PERIODONTAL DISEASE  POST-OPERATIVE DIAGNOSIS:  SAME  PROCEDURE:  Procedure(s): MULTIPLE EXTRACTION TEETH# 3, 4, 5, 6, 7, 8, 9, 10, 11, 12, 13, 14, 20, 21, 22, 23, 24, 25, 26, 27, 28, 29 WITH ALVEOLOPLASTY  SURGEON:  Surgeon(s): Ocie Doyne, DMD  ANESTHESIA:   local and general  EBL:  minimal  DRAINS: none   SPECIMEN:  No Specimen  COUNTS:  YES  PLAN OF CARE: Discharge to home after PACU  PATIENT DISPOSITION:  PACU - hemodynamically stable.   [id number]  Georgia Lopes, DMD 03/07/2021 8:26 AM

## 2021-03-07 NOTE — Transfer of Care (Signed)
Immediate Anesthesia Transfer of Care Note  Patient: Olivia Faulkner  Procedure(s) Performed: MULTIPLE EXTRACTION WITH ALVEOLOPLASTY (Bilateral: Mouth)  Patient Location: PACU  Anesthesia Type:General  Level of Consciousness: drowsy  Airway & Oxygen Therapy: Patient Spontanous Breathing  Post-op Assessment: Report given to RN and Post -op Vital signs reviewed and stable  Post vital signs: Reviewed and stable  Last Vitals:  Vitals Value Taken Time  BP 123/64 03/07/21 0835  Temp    Pulse 85 03/07/21 0835  Resp 10 03/07/21 0835  SpO2 100 % 03/07/21 0835  Vitals shown include unvalidated device data.  Last Pain:  Vitals:   03/07/21 0609  TempSrc:   PainSc: 0-No pain      Patients Stated Pain Goal: 2 (03/05/21 1732)  Complications: No notable events documented.

## 2021-03-07 NOTE — Anesthesia Procedure Notes (Signed)
Procedure Name: Intubation Date/Time: 03/07/2021 7:28 AM Performed by: Vonna Drafts, CRNA Pre-anesthesia Checklist: Patient identified, Emergency Drugs available, Suction available and Patient being monitored Patient Re-evaluated:Patient Re-evaluated prior to induction Oxygen Delivery Method: Circle system utilized Preoxygenation: Pre-oxygenation with 100% oxygen Induction Type: IV induction Ventilation: Mask ventilation without difficulty Laryngoscope Size: Mac and 4 Grade View: Grade I Nasal Tubes: Nasal prep performed and Nasal Rae Tube size: 7.0 mm Placement Confirmation: ETT inserted through vocal cords under direct vision, positive ETCO2 and breath sounds checked- equal and bilateral Secured at: 26 cm Tube secured with: Tape Dental Injury: Teeth and Oropharynx as per pre-operative assessment

## 2021-03-07 NOTE — H&P (Signed)
H&P documentation  -History and Physical Reviewed  -Patient has been re-examined  -No change in the plan of care  Olivia Faulkner  

## 2021-03-07 NOTE — Anesthesia Preprocedure Evaluation (Addendum)
Anesthesia Evaluation  Patient identified by MRN, date of birth, ID band Patient awake    Reviewed: Allergy & Precautions, NPO status , Patient's Chart, lab work & pertinent test results  History of Anesthesia Complications Negative for: history of anesthetic complications  Airway Mallampati: II  TM Distance: >3 FB Neck ROM: Full    Dental  (+) Poor Dentition, Dental Advisory Given   Pulmonary asthma , Current Smoker and Patient abstained from smoking.,    breath sounds clear to auscultation       Cardiovascular hypertension,  Rhythm:Regular     Neuro/Psych  Headaches, PSYCHIATRIC DISORDERS Anxiety Depression    GI/Hepatic negative GI ROS, Neg liver ROS,   Endo/Other  negative endocrine ROS  Renal/GU negative Renal ROS     Musculoskeletal negative musculoskeletal ROS (+)   Abdominal   Peds  Hematology Lab Results      Component                Value               Date                      WBC                      6.0                 03/07/2021                HGB                      13.4                03/07/2021                HCT                      40.4                03/07/2021                MCV                      92.4                03/07/2021                PLT                      199                 03/07/2021              Anesthesia Other Findings   Reproductive/Obstetrics negative OB ROS                            Anesthesia Physical Anesthesia Plan  ASA: 2  Anesthesia Plan: General   Post-op Pain Management:    Induction: Intravenous  PONV Risk Score and Plan: 2 and Ondansetron and Dexamethasone  Airway Management Planned: Nasal ETT  Additional Equipment: None  Intra-op Plan:   Post-operative Plan: Extubation in OR  Informed Consent: I have reviewed the patients History and Physical, chart, labs and discussed the procedure including the risks, benefits and  alternatives for the proposed anesthesia with the patient or authorized representative who has  indicated his/her understanding and acceptance.     Dental advisory given  Plan Discussed with: CRNA and Anesthesiologist  Anesthesia Plan Comments:         Anesthesia Quick Evaluation

## 2021-03-08 NOTE — Anesthesia Postprocedure Evaluation (Signed)
Anesthesia Post Note  Patient: Olivia Faulkner  Procedure(s) Performed: MULTIPLE EXTRACTION WITH ALVEOLOPLASTY (Bilateral: Mouth)     Patient location during evaluation: PACU Anesthesia Type: General Level of consciousness: awake and alert Pain management: pain level controlled Vital Signs Assessment: post-procedure vital signs reviewed and stable Respiratory status: spontaneous breathing, nonlabored ventilation, respiratory function stable and patient connected to nasal cannula oxygen Cardiovascular status: blood pressure returned to baseline and stable Postop Assessment: no apparent nausea or vomiting Anesthetic complications: no   No notable events documented.  Last Vitals:  Vitals:   03/07/21 0850 03/07/21 0905  BP: 116/87 (!) 99/59  Pulse: 60 (!) 56  Resp: 11 15  Temp:  (!) 36.3 C  SpO2: 100% 96%    Last Pain:  Vitals:   03/07/21 0905  TempSrc:   PainSc: Asleep                 Dina Mobley

## 2021-03-09 ENCOUNTER — Encounter (HOSPITAL_COMMUNITY): Payer: Self-pay | Admitting: Oral Surgery

## 2021-03-10 ENCOUNTER — Other Ambulatory Visit: Payer: Self-pay | Admitting: Family Medicine

## 2021-03-10 MED ORDER — RISPERIDONE 1 MG PO TABS: ORAL_TABLET | ORAL | 0 refills | Status: DC

## 2021-04-07 ENCOUNTER — Other Ambulatory Visit: Payer: Self-pay | Admitting: Family Medicine

## 2021-05-05 ENCOUNTER — Encounter: Payer: Self-pay | Admitting: Family Medicine

## 2021-05-05 ENCOUNTER — Other Ambulatory Visit: Payer: Self-pay

## 2021-05-05 ENCOUNTER — Ambulatory Visit (INDEPENDENT_AMBULATORY_CARE_PROVIDER_SITE_OTHER): Payer: Medicaid Other | Admitting: Family Medicine

## 2021-05-05 VITALS — BP 109/70 | HR 78 | Temp 98.0°F | Resp 16 | Wt 132.6 lb

## 2021-05-05 DIAGNOSIS — N926 Irregular menstruation, unspecified: Secondary | ICD-10-CM | POA: Diagnosis not present

## 2021-05-05 LAB — POCT URINE PREGNANCY: Preg Test, Ur: NEGATIVE

## 2021-05-05 NOTE — Progress Notes (Signed)
Patient request lab blood test HCG) Patient has had neg blood test at home. Patient has had tube tied since 2018

## 2021-05-05 NOTE — Progress Notes (Signed)
Established Patient Office Visit  Subjective:  Patient ID: Olivia Faulkner, female    DOB: 1985-04-26  Age: 36 y.o. MRN: [id number]  CC:  Chief Complaint  Patient presents with   Menstrual Problem    HPI Olivia Faulkner presents for missed period and is concerned that she may be pregnant. She has been having unprotected intercourse.   Past Medical History:  Diagnosis Date   Allergic rhinitis    Anxiety    08/22/20- not current   Asthma    allergic   Depression    Eczema    Former smoker    History of chicken pox    History of kidney stones    never passed   Marijuana use    Migraine    MRSA (methicillin resistant staph aureus) culture positive    Pregnancy induced hypertension    with last pregnancy   Suboxone maintenance treatment complicating pregnancy, antepartum (HCC)    Vaginal Pap smear, abnormal    LGSIL    Past Surgical History:  Procedure Laterality Date   MULTIPLE EXTRACTIONS WITH ALVEOLOPLASTY Bilateral 03/07/2021   Procedure: MULTIPLE EXTRACTION WITH ALVEOLOPLASTY;  Surgeon: Ocie Doyne, DMD;  Location: [facility] OR;  Service: Oral Surgery;  Laterality: Bilateral;   TUBAL LIGATION Bilateral 03/13/2017   Procedure: POST PARTUM TUBAL LIGATION;  Surgeon: Tilda Burrow, MD;  Location: [facility]IRTH[facility] SU[facility];  Service: Gynecology;  Laterality: Bilateral;   WISDOM TOOTH EXTRACTION      Family History  Problem Relation Age of Onset   Alcohol abuse Maternal Grandfather    Lung cancer Mother        Living   Hypertension Mother    COPD Mother    Cancer Mother    Heart disease Maternal Grandmother    Kidney disease Maternal Grandmother    Hypertension Maternal Grandmother    Lung cancer Maternal Grandmother    Diabetes Father        Living   Hypertension Father    Skin cancer Father    Heart disease Paternal Grandmother    Heart disease Paternal Grandfather    Alcohol abuse Maternal Aunt    COPD Maternal Aunt    Alcohol abuse Brother    Asthma Brother      Social History   Socioeconomic History   Marital status: Single    Spouse name: Not on file   Number of children: Not on file   Years of education: Not on file   Highest education level: Not on file  Occupational History   Not on file  Tobacco Use   Smoking status: Every Day    Packs/day: 0.50    Types: Cigarettes    Last attempt to quit: 10/04/2014    Years since quitting: 6.5   Smokeless tobacco: Never  Vaping Use   Vaping Use: Every day  Substance and Sexual Activity   Alcohol use: No   Drug use: Yes    Types: Marijuana    Comment: suboxone, MJ last used 7mo ago 08/22/20- not using now   Sexual activity: Yes    Birth control/protection: None  Other Topics Concern   Not on file  Social History Narrative   Not on file   Social Determinants of Health   Financial Resource Strain: Not on file  Food Insecurity: Not on file  Transportation Needs: Not on file  Physical Activity: Not on file  Stress: Not on file  Social Connections: Not on file  Intimate Partner Violence: Not on  file    ROS Review of Systems  Genitourinary:  Positive for menstrual problem. Negative for dysuria.  All other systems reviewed and are negative.  Objective:   Today's Vitals: BP 109/70    Pulse 78    Temp 98 F (36.7 C) (Oral)    Resp 16    Wt 132 lb 9.6 oz (60.1 kg)    SpO2 98%    BMI 23.49 kg/m   Physical Exam  Assessment & Plan:   1. Missed period Although urine preg is negative, will get serum preg to verify. Patient encouraged to utilize some type of contraception (barrier) if she does not want to get pregnant at this time.  - POCT urine pregnancy - hCG, serum, qualitative    Outpatient Encounter Medications as of 05/05/2021  Medication Sig   amoxicillin (AMOXIL) 500 MG capsule Take 1 capsule (500 mg total) by mouth 3 (three) times daily.   diphenhydrAMINE (BENADRYL) 25 MG tablet Take 25 mg by mouth at bedtime.   PROVENTIL HFA 108 (90 Base) MCG/ACT inhaler INHALE ONE TO  TWO PUFFS INTO LUNGS EVERY 6 HOURS AS NEEDED FOR WHEEZING FOR SHORTNESS OF BREATH (Patient taking differently: 1-2 puffs every 6 (six) hours as needed for shortness of breath or wheezing.)   risperiDONE (RISPERDAL) 1 MG tablet TAKE 1 TABLET(1 MG) BY MOUTH DAILY   triamcinolone cream (KENALOG) 0.5 % Apply 1 application topically 3 (three) times daily. (Patient taking differently: Apply 1 application topically 3 (three) times daily as needed (irritation).)   No facility-administered encounter medications on file as of 05/05/2021.    Follow-up: No follow-ups on file.   Tommie Raymond, MD

## 2021-05-06 LAB — HCG, SERUM, QUALITATIVE: hCG,Beta Subunit,Qual,Serum: NEGATIVE m[IU]/mL (ref <6)

## 2021-05-07 ENCOUNTER — Encounter: Payer: Self-pay | Admitting: Family Medicine

## 2021-10-16 NOTE — Progress Notes (Unsigned)
   Subjective:    Patient ID: Olivia Faulkner, female    DOB: 1986-01-24, 36 y.o.   MRN: [id number]   CC: Establish care  HPI:  Olivia Faulkner is a very pleasant 36 y.o. female who presents today to establish care.  Initial concerns:***  Past medical history: Bipolar, methamphetamine use disorder   Past surgical history:***  Current medications:***  Family history:***  Social history:***  ROS: pertinent noted in the HPI   Objective:  There were no vitals taken for this visit.  ***  Vitals and nursing note reviewed  General: NAD, pleasant, able to participate in exam Cardiac: RRR, S1 S2 present. normal heart sounds, no murmurs. Respiratory: CTAB, normal effort, No wheezes, rales or rhonchi Abdomen: Bowel sounds present, non-tender, non-distended, no hepatosplenomegaly Extremities: no edema or cyanosis. Skin: warm and dry, no rashes noted Neuro: alert, no obvious focal deficits Psych: Normal affect and mood   Assessment & Plan:    No problem-specific Assessment & Plan notes found for this encounter.    Sabino Dick, DO Family Medicine Resident

## 2021-10-27 NOTE — Patient Instructions (Incomplete)
It was wonderful to see you today.  Please bring ALL of your medications with you to every visit.   Today we talked about:  -We did your Pap smear today as well as screening for sexually transmitted infections.  I will let you know the results.  If positive, both you and your partner will need to be treated.  I recommend condoms to prevent against sexually transmitted infections. -We are doing blood work today to investigate why you have not had your period for several months. -I am starting on a medication called Provera which she should take once a day for 14 days.  After finishing this medication you should experience some withdrawal bleeding in the next couple days following. -I will let you know the next steps once your blood work returns. -Follow-up with your psychiatrist as scheduled. -I would encourage you to look at the resources below to get connected with a counselor. -Follow up with me in 2-3 weeks.    Thank you for choosing [facility].   Please call [phone] with any questions about today's appointment.  Please be sure to schedule follow up at the front  desk before you leave today.   Sabino Dick, DO PGY-3 Family Medicine     Therapy and Counseling Resources Most providers on this list will take Medicaid. Patients with commercial insurance or Medicare should contact their insurance company to get a list of in network providers.  [facility] (takes children) Location 1: [address] [place], [place] [address] Location 2: [address] [place], [place] [address] [phone]   [facility] (spanish speaking therapist available)(habla espanol)(take medicare and medicaid)  [address], [place], [place] [address], [email or web address] [email or web address] [phone]  [facility] Counseling [address] [place] [place], [place] [address] [phone]  [facility]   [address], [address] [place], [place]  [address]      [phone]  [facility] (spanish available) [address]  [place], [place] [address] [phone]  [facility] (take [facility] and medicare) [address], [address]  [place], [place] [address]       [phone]     [facility] (virtual only) [phone]  [facility]ov[facility]ussma[facility] [address] [place], [place] [phone]  [facility]:  [address] [place] [phone]  [facility]:  [address] [place]ien[place] [phone]  [facility] (under & uninsured) [address]   [place] [phone]    [email or web address]    [facility] [address]  [place]    [phone]  [facility] -[address]     Phone:  [phone]     [facility]-  [address]  [phone]   [facility] [address]      [place], [place] [phone]  [facility]: [address] [place], [place], [place]  [phone]   [facility] (Spanish therapist) [email or web address]  [address] [place]  [address]   [place] [address]    [phone]    [facility]   [address],  [place], [place]  [phone]   [facility]  [address] [place]  [phone]  [facility]  [address] [place], [place]        [phone]  Open Access/Walk In  Clinic under & uninsured  [facility]  [address] [place], [place] [phone] Crisis [phone]  [facility] [place],  (Spanish)   [address], [place]: [phone]) 8:30 - 12; 1 - 2:30  [facility] [employer],  [address] [place], [place]    [phone]):8:30 - 12; 2 - 3PM  [facility] [employer],  [address],  [place];  [phone]):   Mon - Fri 8 AM - 5 PM  [facility] [address] [place]  MWF 12:30 to 3:00 or call to schedule an appointment  [phone]  Specific Provider options Psychology Today  [email or web address] click on find a therapist  enter your zip code left side and select or tailor a therapist for your specific need.   [facility] Provider Directory [email or web address]  (Medicaid)   Follow all drop down to find a provider  Social Support program [facility] [place] [phone] or PhotoSolver.pl [address], [place], [place] Recovery support and educational   24- Hour Availability:   [facility]  [address] [place], [place] [phone] Crisis [phone]  [facility] [phone]  [place]  [phone]   [facility]  [phone] (after hours)  Therapeutic Alternative/Mobile Crisis   [phone]  [place] National Suicide Hotline  [phone] Len Childs)  Call 911 or go to emergency room  [facility]  [phone]);  [place] and [employer]  [phone]); [place], [place], [place], [place], [place], [place], [place]

## 2021-10-28 ENCOUNTER — Encounter: Payer: Self-pay | Admitting: Family Medicine

## 2021-10-28 ENCOUNTER — Ambulatory Visit (INDEPENDENT_AMBULATORY_CARE_PROVIDER_SITE_OTHER): Payer: Medicaid Other | Admitting: Family Medicine

## 2021-10-28 ENCOUNTER — Other Ambulatory Visit (HOSPITAL_COMMUNITY)
Admission: RE | Admit: 2021-10-28 | Discharge: 2021-10-28 | Disposition: A | Discharge status: Home / Self Care | Payer: Medicaid Other | Source: Ambulatory Visit | Attending: Family Medicine | Admitting: Family Medicine

## 2021-10-28 VITALS — BP 98/62 | HR 81 | Ht 64.0 in | Wt 128.0 lb

## 2021-10-28 DIAGNOSIS — N911 Secondary amenorrhea: Secondary | ICD-10-CM | POA: Insufficient documentation

## 2021-10-28 DIAGNOSIS — Z113 Encounter for screening for infections with a predominantly sexual mode of transmission: Secondary | ICD-10-CM | POA: Diagnosis not present

## 2021-10-28 DIAGNOSIS — F419 Anxiety disorder, unspecified: Secondary | ICD-10-CM

## 2021-10-28 DIAGNOSIS — Z124 Encounter for screening for malignant neoplasm of cervix: Secondary | ICD-10-CM | POA: Insufficient documentation

## 2021-10-28 DIAGNOSIS — Z7689 Persons encountering health services in other specified circumstances: Secondary | ICD-10-CM

## 2021-10-28 DIAGNOSIS — F32A Depression, unspecified: Secondary | ICD-10-CM

## 2021-10-28 DIAGNOSIS — N912 Amenorrhea, unspecified: Secondary | ICD-10-CM | POA: Diagnosis not present

## 2021-10-28 LAB — POCT WET PREP (WET MOUNT)
Clue Cells Wet Prep Whiff POC: NEGATIVE
Trichomonas Wet Prep HPF POC: ABSENT

## 2021-10-28 LAB — POCT URINE PREGNANCY: Preg Test, Ur: NEGATIVE

## 2021-10-28 MED ORDER — FLUCONAZOLE 150 MG PO TABS: 150.0000 mg | ORAL_TABLET | Freq: Once | ORAL | 0 refills | Status: AC

## 2021-10-28 MED ORDER — MEDROXYPROGESTERONE ACETATE 10 MG PO TABS: 10.0000 mg | ORAL_TABLET | Freq: Every day | ORAL | 0 refills | Status: DC

## 2021-10-28 NOTE — Assessment & Plan Note (Signed)
Previously followed by family of the [place].  Reports that she is establishing with a new psychiatrist in early September.  She has enough medications to get her to her appointment. PHQ-9 today was elevated with a positive response to question #9. Patient reports no active plan for SI, occasionally has passive thoughts of SI due to her past. She feels safe in current relationships. Whenever she has thoughts of SI she goes to sleep, generally feels better after that. Encouraged counseling in addition to medications. I have provided her with resources that accept Medicaid.

## 2021-10-28 NOTE — Assessment & Plan Note (Signed)
Pap smear performed today with co-testing. Her last last Pap smear in 2018 showed "atypical squamous cells cannot exclude high-grade squamous intraepithelial lesion". -F/u Pap results

## 2021-10-28 NOTE — Assessment & Plan Note (Addendum)
Sexually active with one female partner. No abnormalities noted on GU examination.  -GC/chlamydia, trichomonas testing -Wet prep returned positive for yeast; diflucan sent in to pharmacy -Encouraged barrier protection to protect against STI

## 2021-10-28 NOTE — Assessment & Plan Note (Signed)
Urine pregnancy test today was negative.  We will continue work-up with lab work.  Starting Provera to see if patient has withdrawal bleed as well. -TSH, LH, FSH, prolactin, total testosterone  -Provera 10 mg x14 days -Patient to follow up in 2-3 weeks

## 2021-10-29 LAB — HEPATITIS B SURFACE ANTIGEN: Hepatitis B Surface Ag: NEGATIVE

## 2021-10-29 LAB — FSH/LH
FSH: 8.2 m[IU]/mL
LH: 10.9 m[IU]/mL

## 2021-10-29 LAB — PROLACTIN: Prolactin: 115 ng/mL — ABNORMAL HIGH (ref 4.8–23.3)

## 2021-10-29 LAB — TSH: TSH: 1.78 u[IU]/mL (ref 0.450–4.500)

## 2021-10-29 LAB — HIV ANTIBODY (ROUTINE TESTING W REFLEX): HIV Screen 4th Generation wRfx: NONREACTIVE

## 2021-10-29 LAB — RPR: RPR Ser Ql: NONREACTIVE

## 2021-10-29 LAB — TESTOSTERONE: Testosterone: 44 ng/dL (ref 8–60)

## 2021-10-30 ENCOUNTER — Other Ambulatory Visit: Payer: Self-pay | Admitting: Family Medicine

## 2021-10-30 LAB — CYTOLOGY - PAP
Chlamydia: NEGATIVE
Comment: NEGATIVE
Comment: NEGATIVE
Comment: NORMAL
Diagnosis: NEGATIVE
High risk HPV: NEGATIVE
Neisseria Gonorrhea: NEGATIVE

## 2021-10-30 MED ORDER — QUETIAPINE FUMARATE 300 MG PO TABS: 300.0000 mg | ORAL_TABLET | Freq: Every day | ORAL | 1 refills | Status: DC

## 2021-10-30 NOTE — Progress Notes (Signed)
Given elevated prolactin level with galactorrhea and amenorrhea, d/w patient regarding d/c risperidone. Will start Seroquel instead until she has follow up with Psychiatry in September. Plan for recheck prolactin level at her upcoming visit in August.

## 2021-11-11 ENCOUNTER — Encounter: Payer: Self-pay | Admitting: Family Medicine

## 2021-11-11 ENCOUNTER — Ambulatory Visit (INDEPENDENT_AMBULATORY_CARE_PROVIDER_SITE_OTHER): Payer: Medicaid Other | Admitting: Family Medicine

## 2021-11-11 ENCOUNTER — Other Ambulatory Visit: Payer: Self-pay

## 2021-11-11 VITALS — BP 122/80 | HR 96 | Wt 128.0 lb

## 2021-11-11 DIAGNOSIS — N912 Amenorrhea, unspecified: Secondary | ICD-10-CM

## 2021-11-11 DIAGNOSIS — R41840 Attention and concentration deficit: Secondary | ICD-10-CM | POA: Diagnosis not present

## 2021-11-11 DIAGNOSIS — F251 Schizoaffective disorder, depressive type: Secondary | ICD-10-CM

## 2021-11-11 DIAGNOSIS — N911 Secondary amenorrhea: Secondary | ICD-10-CM

## 2021-11-11 NOTE — Assessment & Plan Note (Signed)
Depressive sx mildly improved on Seroquel x 3 weeks. Denies SI currently. Continue current regimen and follow up with psychiatry 12/2021.

## 2021-11-11 NOTE — Assessment & Plan Note (Signed)
Hx ADHD dx at 36 yo and treated with Concerta until 2007. Feels sx returning x 1 year. Patient given adult ADHD self-report scale and deferred to psychiatry appointment in 12/2021.

## 2021-11-11 NOTE — Patient Instructions (Signed)
It was wonderful to see you today.  Please bring ALL of your medications with you to every visit.   Today we talked about:  We re-checked your lab work today to determine the cause of your lack of period. We will follow up with those results and make a plan. Please continue the Seroquel and let us know if your depression symptoms worse or if you have suicidal ideation. Follow-up with psychiatry to further assess your ADHD symptoms and start on medication management.  Please follow up in 1 month.  Thank you for choosing [facility].   Please call [phone] with any questions about today's appointment.  Please be sure to schedule follow up at the front  desk before you leave today.   Elberta Fortis, DO  Family Medicine

## 2021-11-11 NOTE — Progress Notes (Signed)
    SUBJECTIVE:   CHIEF COMPLAINT / HPI:   36 yo female with PMHx MDD, amenorrhea and schizophrenia presents for: States she is still having amenorrhea since 02/2021 despite taking Provera for the last two weeks. She also continues to have leaking from her breasts. States she feels like her ADHD symptoms have returned x 3 months. States she was diagnosed at 36 yo and treated for 3-4 years but has been off treatment since 2007. States she has difficulty concentrating at work and it has started to affect her performance. States her depression has mildly improved since starting Seroquel. States she is sleeping better and now denies SI.  PERTINENT  PMH / PSH: amenorrhea, schizoaffective disorder  OBJECTIVE:   BP 122/80   Pulse 96   Wt 128 lb (58.1 kg)   LMP 02/04/2021   SpO2 100%   BMI 21.97 kg/m    General: NAD, pleasant, able to participate in exam Cardiac: RRR, no murmurs. Respiratory: CTAB, normal effort, No wheezes, rales or rhonchi Abdomen: Bowel sounds present, nontender, nondistended Extremities: no edema or cyanosis. Skin: warm and dry, no rashes noted Neuro: alert, no obvious focal deficits Psych: Normal affect and mood  ASSESSMENT/PLAN:   Secondary amenorrhea Elevated prolactin level, repeat today as patient just discontinued taking Risperidol 3 weeks ago. Repeat beta-HCG to rule out pregnancy.  Schizoaffective disorder, depressive type (HCC) Depressive sx mildly improved on Seroquel x 3 weeks. Denies SI currently. Continue current regimen and follow up with psychiatry 12/2021.  Inattention Hx ADHD dx at 36 yo and treated with Concerta until 2007. Feels sx returning x 1 year. Patient given adult ADHD self-report scale and deferred to psychiatry appointment in 12/2021.     Dr. Elberta Fortis, DO [place] [facility]

## 2021-11-11 NOTE — Assessment & Plan Note (Addendum)
Elevated prolactin level, repeat today as patient just discontinued taking Risperidol 3 weeks ago. Repeat beta-HCG to rule out pregnancy.

## 2021-11-12 LAB — PROLACTIN: Prolactin: 15.5 ng/mL (ref 4.8–23.3)

## 2021-11-12 LAB — BETA HCG QUANT (REF LAB): hCG Quant: <1 m[IU]/mL

## 2021-11-13 ENCOUNTER — Telehealth: Payer: Self-pay | Admitting: Family Medicine

## 2021-11-13 DIAGNOSIS — N911 Secondary amenorrhea: Secondary | ICD-10-CM

## 2021-11-13 DIAGNOSIS — F251 Schizoaffective disorder, depressive type: Secondary | ICD-10-CM

## 2021-11-13 NOTE — Telephone Encounter (Signed)
Spoke with patient about lab results and discussed further testing to determine the cause of her secondary amenorrhea. Prolactin and beta-HCG were negative, will order TVUS to rule out PCOS.  Patient also mentioned her psychiatry appointment scheduled next month is being pushed out. Will resend referral due to an acute need for self-reported worsening ADHD symptoms that are impacting patient's ability to work.  Answered all patient questions.  Elberta Fortis, DO

## 2021-11-15 ENCOUNTER — Encounter: Payer: Self-pay | Admitting: Family Medicine

## 2021-11-18 ENCOUNTER — Telehealth: Payer: Self-pay

## 2021-11-18 NOTE — Telephone Encounter (Signed)
Informed patient of upcoming appointment.  [facility] Pelvis Transvaginal 11/25/2021 1430 with 1400 arrival  .Glennie Hawk, CMA

## 2021-11-25 ENCOUNTER — Ambulatory Visit (HOSPITAL_COMMUNITY)
Admission: RE | Admit: 2021-11-25 | Discharge: 2021-11-25 | Disposition: A | Discharge status: Home / Self Care | Payer: Medicaid Other | Source: Ambulatory Visit | Attending: Family Medicine | Admitting: Family Medicine

## 2021-11-25 DIAGNOSIS — N911 Secondary amenorrhea: Secondary | ICD-10-CM | POA: Diagnosis present
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
## 2021-12-11 ENCOUNTER — Ambulatory Visit (INDEPENDENT_AMBULATORY_CARE_PROVIDER_SITE_OTHER): Payer: Medicaid Other | Admitting: Family Medicine

## 2021-12-11 ENCOUNTER — Encounter: Payer: Self-pay | Admitting: Family Medicine

## 2021-12-11 VITALS — BP 98/62 | HR 95 | Wt 134.0 lb

## 2021-12-11 DIAGNOSIS — N911 Secondary amenorrhea: Secondary | ICD-10-CM

## 2021-12-11 DIAGNOSIS — F251 Schizoaffective disorder, depressive type: Secondary | ICD-10-CM | POA: Diagnosis present

## 2021-12-11 MED ORDER — QUETIAPINE FUMARATE 300 MG PO TABS: 300.0000 mg | ORAL_TABLET | Freq: Every day | ORAL | 0 refills | Status: AC

## 2021-12-11 MED ORDER — TRAZODONE HCL 50 MG PO TABS: 50.0000 mg | ORAL_TABLET | Freq: Every day | ORAL | 0 refills | Status: DC

## 2021-12-11 MED ORDER — SERTRALINE HCL 100 MG PO TABS: 100.0000 mg | ORAL_TABLET | Freq: Every day | ORAL | 0 refills | Status: DC

## 2021-12-11 NOTE — Patient Instructions (Signed)
It was wonderful to see you today!  Please bring ALL of your medications with you to every visit.   Today we talked about:  I refilled a 2 week supply of your Zoloft, Trazodone and Seroquel to get to your psychiatry appointment. Please let us know if you need additional prescriptions I included resources below for the walk-in psychiatry services should you need immediate assistance. I am so glad your period returned. If you have similar issues in the future please feel free to call the clinic.  Please follow up in 1 year for annual visit.  Thank you for choosing [facility].   Please call [phone] with any questions about today's appointment.  Please be sure to schedule follow up at the front  desk before you leave today.   Elberta Fortis, DO Family Medicine     Walk-in Psychiatry Resources:  [facility] [address]  [place], [place] [address] [phone]  Urgent psychiatry (medication management) Monday-Thursday 8-11AM.   It is highly recommended that you show up early because it is first come first serve.   For urgent therapy (not medication) Walk in hours are 8-1pm Monday through Wednesday

## 2021-12-11 NOTE — Assessment & Plan Note (Signed)
Resolved. Patient started period this week.

## 2021-12-11 NOTE — Assessment & Plan Note (Addendum)
Scheduled psychiatry appointment this week cancelled due to clinic issue. Rescheduled for 2 weeks. PHQ-9: 13, markedly improved. Denies SI. -Refilled Zoloft, Seroquel and Trazodone x 15 days to continue therapy until patient sees psychiatry -Provided walk-in psychiatry resources

## 2022-03-01 ENCOUNTER — Other Ambulatory Visit: Payer: Self-pay | Admitting: Family Medicine

## 2022-03-02 ENCOUNTER — Other Ambulatory Visit (HOSPITAL_BASED_OUTPATIENT_CLINIC_OR_DEPARTMENT_OTHER): Payer: Self-pay

## 2022-03-02 ENCOUNTER — Other Ambulatory Visit (HOSPITAL_COMMUNITY): Payer: Self-pay

## 2022-03-02 MED ORDER — ALBUTEROL SULFATE HFA 108 (90 BASE) MCG/ACT IN AERS
2.0000 | INHALATION_SPRAY | Freq: Four times a day (QID) | RESPIRATORY_TRACT | 0 refills | Status: DC | PRN
Start: 2022-03-02 — End: 2022-05-06
  Filled 2022-03-02 – 2022-03-04 (×2): qty 18, 25d supply, fill #0

## 2022-03-04 ENCOUNTER — Other Ambulatory Visit (HOSPITAL_COMMUNITY): Payer: Self-pay

## 2022-03-09 ENCOUNTER — Other Ambulatory Visit (HOSPITAL_COMMUNITY): Payer: Self-pay

## 2022-05-06 ENCOUNTER — Other Ambulatory Visit: Payer: Self-pay | Admitting: Family Medicine

## 2022-05-07 ENCOUNTER — Other Ambulatory Visit (HOSPITAL_COMMUNITY): Payer: Self-pay

## 2022-05-07 MED ORDER — ALBUTEROL SULFATE HFA 108 (90 BASE) MCG/ACT IN AERS
2.0000 | INHALATION_SPRAY | Freq: Four times a day (QID) | RESPIRATORY_TRACT | 0 refills | Status: DC | PRN
Start: 2022-05-07 — End: 2023-12-06
  Filled 2022-05-07 – 2023-01-22 (×3): qty 18, 25d supply, fill #0

## 2022-05-15 ENCOUNTER — Other Ambulatory Visit (HOSPITAL_COMMUNITY): Payer: Self-pay

## 2022-05-25 ENCOUNTER — Other Ambulatory Visit: Payer: Self-pay | Admitting: Family Medicine

## 2022-05-25 ENCOUNTER — Encounter: Payer: Self-pay | Admitting: Family Medicine

## 2022-05-25 DIAGNOSIS — F251 Schizoaffective disorder, depressive type: Secondary | ICD-10-CM

## 2022-05-25 MED ORDER — METHYLPHENIDATE HCL ER (OSM) 36 MG PO TBCR: 36.0000 mg | EXTENDED_RELEASE_TABLET | Freq: Every day | ORAL | 0 refills | Status: DC

## 2022-05-25 MED ORDER — SERTRALINE HCL 100 MG PO TABS: 100.0000 mg | ORAL_TABLET | Freq: Every day | ORAL | 0 refills | Status: AC

## 2022-09-07 ENCOUNTER — Other Ambulatory Visit: Payer: Self-pay | Admitting: Student

## 2022-09-08 MED ORDER — METHYLPHENIDATE HCL ER (OSM) 36 MG PO TBCR: 36.0000 mg | EXTENDED_RELEASE_TABLET | Freq: Every day | ORAL | 0 refills | Status: AC

## 2022-09-15 ENCOUNTER — Encounter: Payer: Self-pay | Admitting: Family Medicine

## 2022-09-15 ENCOUNTER — Telehealth: Payer: Medicaid Other | Admitting: Nurse Practitioner

## 2022-09-15 ENCOUNTER — Encounter: Payer: Self-pay | Admitting: Nurse Practitioner

## 2022-09-15 DIAGNOSIS — L550 Sunburn of first degree: Secondary | ICD-10-CM | POA: Diagnosis not present

## 2022-09-15 NOTE — Progress Notes (Signed)
E-Visit for Sunburn  We are sorry that you are not feeling well.  Here is how we plan to help!  Based on what you have shared with me, I'd like to share with you a treatment plan for sunburn.   Most sunburn is a first degree burn that turns the skin pink or red.  It can be painful to touch.  If you stayed in the sun for a prolonged period this might have progressed to a second degree burn with blistering!  Usually the pain and swelling starts after about 4 hours, peaks at 24 hours and begins to improve after 48 hours or about 2 days.  REMEMBER prolonged exposure to the sun increases your risk of skin cancer so use sunscreen before you go outside!  We will give you more information about sunscreen use later in your care plan.  Your sunburn can be managed by self-care at home.  Please use the following care guide to manage your sunburn.  If you symptoms worsen, you have other questions or concerns, or you develop any of the warnings signs listed in your care plan you will need to seek a face to face visit with a provider without waiting!  Home Care Advice for Treating Mild Sunburn:  Take Ibuprofen (Advil, Motrin) for pain relief as soon as possible.  The adult dosage is up to 600 mg every 6 hours.  Starting within 6 hours of sun exposure may greatly reduce your discomfort.  If you cannot take Ibuprofen you may use Acetaminophen instead. Do not take Ibuprofen if you have stomach problems, kidney disease or are pregnant. Do not take Ibuprofen if you have been told by your doctor or pharmacist to avoid this class of drugs. Do not take Acetaminophen if you have liver disease. Read the package warnings on any medication that you take!  2.  Use a steroid cream on the affected skin.  If you apply an over the counter steroid       cream as soon as possible and repeat it three times a day it may reduce the pain and      and swelling.  Until you get the steroid cream you may start with a moistening  cream      cream or aloe gel.  3. For second or third degree sunburn with painful blistering, you can use an over the counter product Burn Jel Plus Pain Relieving Gel. Apply in a thick even layer over the     affected area not more than 3 to 4 times daily If you need to cover the area to protect      it from friction of clothing, you can use Moist Burn Pads such as Hydrogel Burn Pads       which are available over the counter.  4.  Apply cool compresses to the burned areas several times a day.  5.  Avoid soap on the sunburned areas.  6.  Drink plenty of water.  It is easy to get dehydrated from prolong time in the sun      Outdoors.  7.  For any broken blisters: Trim off the dead skin with fine scissors.  It is wise to clean the scissor with alcohol before use. Apply antibiotic ointments to the blister.  Apply twice a day for three days.  There are triple antibiotic ointments with topical pain relievers available at stores. Caution:leave intact blisters alone.  They are protecting the skin and will allow it to heal.    8.  Taking Vitamin C orally may reduce sun damage to your skin.  Follow the      Instructions on the bottle.  The recommended adult dosage is 2 grams.  What to Expect:  Pain usually stops after 2 or 3 days. Skin flaking and peeling usually occurs for 3-7 days after a sunburn.  Call your provider if:  You feel very weak or have difficulty standing. Blister develops on your face. You become sensitive to light because of eye pain. Your skin looks infected (red streaks, puss or worsening tenderness after 48 hours. You feel you should be seen.  Preventing Sunburns:  Apply 20-30 SPF sunscreen to your skin before going into the sun. Reapply every 2-4 hours or after sweating or swimming. Sunscreens protect from sunburns but do not completely prevent skin damage.  Sun exposure still increases your risk of premature aging and skin cancers.  Thank you for choosing an  e-visit.  Your e-visit answers were reviewed by a board certified advanced clinical practitioner to complete your personal care plan. Depending upon the condition, your plan could have included both over the counter or prescription medications.  Please review your pharmacy choice. Make sure the pharmacy is open so you can pick up prescription now. If there is a problem, you may contact your provider through MyChart messaging and have the prescription routed to another pharmacy.  Your safety is important to us. If you have drug allergies check your prescription carefully.   For the next 24 hours you can use MyChart to ask questions about today's visit, request a non-urgent call back, or ask for a work or school excuse. You will get an email in the next two days asking about your experience. I hope that your e-visit has been valuable and will speed your recovery.  

## 2022-09-15 NOTE — Progress Notes (Signed)
I have spent 5 minutes in review of e-visit questionnaire, review and updating patient chart, medical decision making and response to patient.  ° °Olivia Faulkner W Ghassan Coggeshall, NP ° °  °

## 2022-09-17 ENCOUNTER — Ambulatory Visit (INDEPENDENT_AMBULATORY_CARE_PROVIDER_SITE_OTHER): Payer: Medicaid Other | Admitting: Student

## 2022-09-17 VITALS — BP 122/71 | HR 84 | Wt 182.8 lb

## 2022-09-17 DIAGNOSIS — L559 Sunburn, unspecified: Secondary | ICD-10-CM | POA: Insufficient documentation

## 2022-09-17 MED ORDER — TRIAMCINOLONE ACETONIDE 0.5 % EX CREA: 1.0000 | TOPICAL_CREAM | Freq: Three times a day (TID) | CUTANEOUS | 0 refills | Status: DC | PRN

## 2022-09-17 NOTE — Progress Notes (Signed)
    SUBJECTIVE:   CHIEF COMPLAINT / HPI:   Sunburn Patient returned from a beach trip on 6/10 and was sunburned during the trip.  For past few days, ankles and lower legs are swollen, skin is deep red and she is having a difficult time walking due to foot and ankle pain.  Beach trip was via car ride, 3.5 hours long, did not stop.  She did have an E-visit last night.  Provider recommended ibuprofen every 6 hours, OTC steroid cream on affected skin 3 times daily, apply burn gel, cool compresses, hydration.   She has been taking ibuprofen q6h (unsure of dose), applying burn gel, applied some triamcinolone 0.5% she has for eczema (needs refill), drinking water, and oat meal baths. No fever, no headache, does have chills, no nausea.   PERTINENT  PMH / PSH: No pertinent past medical history  OBJECTIVE:   BP 122/71   Pulse 84   Wt 182 lb 12.8 oz (82.9 kg)   SpO2 100%   BMI 31.38 kg/m    General: NAD, pleasant, able to participate in exam Cardiac: RRR, no murmurs. Respiratory: Breathing comfortably on room air Extremities: BLEs edematous, R mid calf measuring 40 cm and left 39.5 cm with no calf tenderness to palpation other than skin being irritated Skin: erythematous with blisters present on right foot and knee.bilateral shoulders erythematous with peeling skin.  See photos Neuro: alert, no obvious focal deficits Psych: Normal affect and mood      ASSESSMENT/PLAN:   Burn from the sun BLE edema likely related to sunburn.  Low concern for DVT as it is bilateral and less than 3 cm difference between calves.   Plan for AVS Our plans for today:  -I refilled triamcinolone cream.  You can mix this with some over-the-counter Eucerin cream and apply it to the affected skin twice daily -You can continue using ibuprofen 600 mg every 6 hours along with the burn gel, cool compresses, and drink lots of water for hydration -Keep blisters clean with mild soap and water, do not pop them.  You  can apply over-the-counter antibiotic ointment if desired. -Keep your feet elevated is much as possible -Reasons to return or go to the ED: symptoms worsen, you feel like you are dehydrated including fast heart rate, decreased urine, dark and strong smelling urine, lightheadedness     Dr. Erick Alley, DO  [facility]

## 2022-09-17 NOTE — Assessment & Plan Note (Signed)
BLE edema likely related to sunburn.  Low concern for DVT as it is bilateral and less than 3 cm difference between calves.   Plan for AVS Our plans for today:  -I refilled triamcinolone cream.  You can mix this with some over-the-counter Eucerin cream and apply it to the affected skin twice daily -You can continue using ibuprofen 600 mg every 6 hours along with the burn gel, cool compresses, and drink lots of water for hydration -Keep blisters clean with mild soap and water, do not pop them.  You can apply over-the-counter antibiotic ointment if desired. -Keep your feet elevated is much as possible -Reasons to return or go to the ED: symptoms worsen, you feel like you are dehydrated including fast heart rate, decreased urine, dark and strong smelling urine, lightheadedness

## 2022-09-17 NOTE — Patient Instructions (Signed)
It was great to see you! Thank you for allowing me to participate in your care!  I recommend that you always bring your medications to each appointment as this makes it easy to ensure you are on the correct medications and helps [place] not miss when refills are needed.  Our plans for today:  -I refilled triamcinolone cream.  You can mix this with some over-the-counter Eucerin cream and apply it to the affected skin twice daily -You can continue using ibuprofen 600 mg every 6 hours along with the burn gel, cool compresses, and drink lots of water for hydration -Keep blisters clean with mild soap and water, do not pop them.  You can apply over-the-counter antibiotic ointment if desired. -Keep your feet elevated is much as possible -Reasons to return or go to the ED: symptoms worsen, you feel like you are dehydrated including fast heart rate, decreased urine, dark and strong smelling urine, lightheadedness  Take care and seek immediate care sooner if you develop any concerns.   Dr. Erick Alley, DO [facility]

## 2022-12-11 ENCOUNTER — Other Ambulatory Visit (HOSPITAL_COMMUNITY): Payer: Self-pay

## 2022-12-22 ENCOUNTER — Other Ambulatory Visit (HOSPITAL_COMMUNITY): Payer: Self-pay

## 2023-01-22 ENCOUNTER — Encounter (HOSPITAL_COMMUNITY): Payer: Self-pay

## 2023-01-22 ENCOUNTER — Other Ambulatory Visit (HOSPITAL_COMMUNITY): Payer: Self-pay

## 2023-02-03 ENCOUNTER — Other Ambulatory Visit (HOSPITAL_COMMUNITY): Payer: Self-pay

## 2023-06-01 ENCOUNTER — Encounter: Payer: Self-pay | Admitting: Student

## 2023-06-01 ENCOUNTER — Ambulatory Visit (INDEPENDENT_AMBULATORY_CARE_PROVIDER_SITE_OTHER): Payer: MEDICAID | Admitting: Student

## 2023-06-01 ENCOUNTER — Other Ambulatory Visit (HOSPITAL_COMMUNITY)
Admission: RE | Admit: 2023-06-01 | Discharge: 2023-06-01 | Disposition: A | Discharge status: Home / Self Care | Payer: MEDICAID | Source: Ambulatory Visit | Attending: Family Medicine | Admitting: Family Medicine

## 2023-06-01 VITALS — BP 122/73 | HR 71 | Ht 64.0 in | Wt 182.6 lb

## 2023-06-01 DIAGNOSIS — N898 Other specified noninflammatory disorders of vagina: Secondary | ICD-10-CM | POA: Diagnosis not present

## 2023-06-01 DIAGNOSIS — Z113 Encounter for screening for infections with a predominantly sexual mode of transmission: Secondary | ICD-10-CM

## 2023-06-01 NOTE — Progress Notes (Signed)
    SUBJECTIVE:   CHIEF COMPLAINT / HPI:   STD testing Patient presents for routine STD testing. 1 partner for past year Does not use condoms, not on birth control but had tubes tied Does have oral sex No increased discharge with odor, current abdominal pain, fever nausea or vomiting  Genital lesion For past~2 years patient notes a bump appearing under the skin near the L labia majora which is painful, resolves on its own within 2 days.  Has only occurred 3 times within the last 2 years.  It is not present right now  PERTINENT  PMH / PSH: None pertinent  OBJECTIVE:   BP 122/73   Pulse 71   Ht 5\' 4"  (1.626 m)   Wt 182 lb 9.6 oz (82.8 kg)   LMP 05/15/2023   SpO2 100%   BMI 31.34 kg/m    General: NAD, pleasant, able to participate in exam Cardiac: Well-perfused Respiratory: Breathing comfortably on room air GU: Chaperoned by CMA.  Normal external female genitalia with no lesions.  Pink moist vaginal mucosa, normal-appearing cervix with scant amount of whitish/clear discharge Extremities: no edema or cyanosis. Skin: warm and dry, no rashes noted Neuro: alert, no obvious focal deficits Psych: Normal affect and mood  ASSESSMENT/PLAN:   Routine screening for STI (sexually transmitted infection) No concerning symptoms for STDs today.  Patient request routine screening. -Vaginal swabs for GC chlamydia and trichomoniasis -Throat swabs for GC and chlamydia -Future order placed for HIV and RPR as lab is closed  Vaginal lesion Not present today.  Described as small painful bump under the skin near labia majora which completely resolves on its own within 2 days.  Could potentially be cyst vs HS vs herpes though not described as vesicular. -Patient advised to return as soon as lesion appears again     Dr. Erick Alley, DO [facility]

## 2023-06-01 NOTE — Assessment & Plan Note (Signed)
 Not present today.  Described as small painful bump under the skin near labia majora which completely resolves on its own within 2 days.  Could potentially be cyst vs HS vs herpes though not described as vesicular. -Patient advised to return as soon as lesion appears again

## 2023-06-01 NOTE — Patient Instructions (Addendum)
 It was great to see you! Thank you for allowing me to participate in your care!  Return at your convenience for a lab only appointment for HIV and syphilis testing.  These labs have already been ordered. We are checking some labs today, I will call you if they are abnormal will send you a MyChart message or a letter if they are normal.  If you do not hear about your labs in the next 2 weeks please let us know.  Take care and seek immediate care sooner if you develop any concerns.   Dr. Erick Alley, DO [facility]

## 2023-06-01 NOTE — Assessment & Plan Note (Signed)
 No concerning symptoms for STDs today.  Patient request routine screening. -Vaginal swabs for GC chlamydia and trichomoniasis -Throat swabs for GC and chlamydia -Future order placed for HIV and RPR as lab is closed

## 2023-06-02 LAB — CERVICOVAGINAL ANCILLARY ONLY
Chlamydia: NEGATIVE
Chlamydia: NEGATIVE
Comment: NEGATIVE
Comment: NORMAL
Comment: NEGATIVE
Comment: NEGATIVE
Comment: NORMAL
Neisseria Gonorrhea: NEGATIVE
Neisseria Gonorrhea: NEGATIVE
Trichomonas: NEGATIVE

## 2023-06-03 ENCOUNTER — Encounter: Payer: Self-pay | Admitting: Student

## 2023-08-16 ENCOUNTER — Other Ambulatory Visit: Payer: Self-pay | Admitting: Family Medicine

## 2023-08-16 ENCOUNTER — Other Ambulatory Visit (HOSPITAL_COMMUNITY): Payer: Self-pay

## 2023-08-16 ENCOUNTER — Other Ambulatory Visit: Payer: Self-pay | Admitting: Student

## 2023-08-18 ENCOUNTER — Other Ambulatory Visit: Payer: Self-pay | Admitting: Student

## 2023-08-18 ENCOUNTER — Other Ambulatory Visit: Payer: Self-pay

## 2023-08-18 MED ORDER — TRIAMCINOLONE ACETONIDE 0.5 % EX CREA: 1.0000 | TOPICAL_CREAM | Freq: Three times a day (TID) | CUTANEOUS | 0 refills | Status: AC | PRN

## 2023-08-19 ENCOUNTER — Other Ambulatory Visit (HOSPITAL_COMMUNITY): Payer: Self-pay

## 2023-09-01 ENCOUNTER — Encounter: Payer: Self-pay | Admitting: Family Medicine

## 2023-09-01 NOTE — Telephone Encounter (Signed)
 Patient scheduled.

## 2023-09-02 ENCOUNTER — Ambulatory Visit (INDEPENDENT_AMBULATORY_CARE_PROVIDER_SITE_OTHER): Payer: MEDICAID | Admitting: Student

## 2023-09-02 VITALS — BP 129/67 | HR 99 | Ht 64.0 in | Wt 179.4 lb

## 2023-09-02 DIAGNOSIS — T50905A Adverse effect of unspecified drugs, medicaments and biological substances, initial encounter: Secondary | ICD-10-CM | POA: Diagnosis not present

## 2023-09-02 DIAGNOSIS — Z6830 Body mass index (BMI) 30.0-30.9, adult: Secondary | ICD-10-CM | POA: Diagnosis not present

## 2023-09-02 DIAGNOSIS — Z0000 Encounter for general adult medical examination without abnormal findings: Secondary | ICD-10-CM | POA: Diagnosis not present

## 2023-09-02 NOTE — Progress Notes (Signed)
    SUBJECTIVE:   CHIEF COMPLAINT / HPI:   Adverse Drug Side Effect Olivia Faulkner is a 38 year old female who comes in after 3 days of significant nausea, vomiting, abdominal pain after taking a dose of Ozempic that she purchased through an acquaintance.  This was not prescribed to her.  She is feeling better now, but did miss 3 days of work overall.  She purchased the Ozempic in hopes that it would help her to lose weight.  She is convinced by this experience that GLP-1a assisted weight loss is not for her and she is more interested in addressing her weight through dietary interventions   OBJECTIVE:   BP 129/67   Pulse 99   Ht 5\' 4"  (1.626 m)   Wt 179 lb 6.4 oz (81.4 kg)   SpO2 99%   BMI 30.79 kg/m   General: alert & oriented, no apparent distress, well groomed HEENT: normocephalic, atraumatic, EOM grossly intact, oral mucosa moist, neck supple Respiratory: normal respiratory effort GI: non-distended Skin: no rashes, no jaundice Psych: appropriate mood and affect   ASSESSMENT/PLAN:   Assessment & Plan Adverse effect of drug, initial encounter Nausea, vomiting, abdominal pain in the setting of nonprescribed GLP-1 agonist use.  These are well documented side effects of these medications and as she has since returned to her baseline and is well-appearing and well-hydrated on my exam today, I do not believe that there is any further workup or intervention indicated at this time.  We did discuss the importance of only taking medications prescribed in the setting of a proper doctor-patient relationship. BMI 30.0-30.9,adult BMI just into the class I obesity range.  No associated comorbidities at this time.  Would benefit from modest weight loss, she tells me that her target is about 5-10 pounds of weight loss.  We discussed achieving this by tracking her protein intake and targeting roughly 1 g per pound of body weight of protein intake daily.  She will return to care if she feels that she needs  more guidance in reaching this goal.     J Lark Plum, MD [facility]

## 2023-09-24 ENCOUNTER — Other Ambulatory Visit (HOSPITAL_COMMUNITY): Payer: Self-pay

## 2023-12-06 ENCOUNTER — Other Ambulatory Visit: Payer: Self-pay

## 2023-12-10 ENCOUNTER — Other Ambulatory Visit (HOSPITAL_COMMUNITY): Payer: Self-pay

## 2023-12-14 ENCOUNTER — Other Ambulatory Visit (HOSPITAL_COMMUNITY): Payer: Self-pay

## 2023-12-16 ENCOUNTER — Other Ambulatory Visit (HOSPITAL_COMMUNITY): Payer: Self-pay

## 2023-12-17 ENCOUNTER — Other Ambulatory Visit (HOSPITAL_COMMUNITY): Payer: Self-pay

## 2023-12-17 ENCOUNTER — Other Ambulatory Visit: Payer: Self-pay | Admitting: Family Medicine

## 2023-12-17 MED ORDER — ALBUTEROL SULFATE HFA 108 (90 BASE) MCG/ACT IN AERS
2.0000 | INHALATION_SPRAY | Freq: Four times a day (QID) | RESPIRATORY_TRACT | 3 refills | Status: AC | PRN
  Filled 2023-12-17 – 2024-04-03 (×2): qty 18, 25d supply, fill #0

## 2023-12-18 ENCOUNTER — Other Ambulatory Visit (HOSPITAL_COMMUNITY): Payer: Self-pay

## 2023-12-22 ENCOUNTER — Other Ambulatory Visit: Payer: Self-pay

## 2024-01-06 ENCOUNTER — Ambulatory Visit: Payer: Self-pay

## 2024-01-06 NOTE — Telephone Encounter (Signed)
 FYI Only or Action Required?: FYI only for provider.  Patient was last seen in primary care on 09/02/2023 by Marlee Lynwood NOVAK, MD.  Called Nurse Triage reporting Sore Throat/localized rash  Symptoms began 3 days ago.  Interventions attempted: Other: Kenalog  cream.  Symptoms are: unchanged.  Triage Disposition: See Physician Within 24 Hours  Patient/caregiver understands and will follow disposition?: yes Pt goes to [facility]. Call transferred to that office.      Copied from CRM [id number]. Topic: Clinical - Red Word Triage >> Jan 06, 2024  9:36 AM Zane F wrote: [facility] that prompted transfer to Nurse Triage:   Concern: sore throat; sudden rash     Symptoms:   When did the symptoms start?: sudden rash three days ago ; sore throat started this morning   What have you done to aid in the concern ? Have you taken anything to assist with the matter?: No   If so, what did you take?:    Wanted to let you know I will be transferring you to further discuss your concern. Please be advised the nurse can assist with scheduling. Reason for Disposition  [1] Localized rash is very painful AND [2] no fever  Answer Assessment - Initial Assessment Questions 1. ONSET: When did the throat start hurting? (Hours or days ago)      This am  2. SEVERITY: How bad is the sore throat? (Scale 1-10; mild, moderate or severe)     6/10 3. STREP EXPOSURE: Has there been any exposure to strep within the past week? If Yes, ask: What type of contact occurred?      no 4.  VIRAL SYMPTOMS: Are there any symptoms of a cold, such as a runny nose, cough, hoarse voice or red eyes?      no 5. FEVER: Do you have a fever? If Yes, ask: What is your temperature, how was it measured, and when did it start?     no 6. PUS ON THE TONSILS: Is there pus on the tonsils in the back of your throat?     no 7. OTHER SYMPTOMS: Do you have any other symptoms? (e.g., difficulty  breathing, headache, rash)     Rash left forearm little red blisters  Answer Assessment - Initial Assessment Questions 1. APPEARANCE of RASH: What does the rash look like? (e.g., blisters, dry flaky skin, red spots, redness, sores)     Tiny red blisters with underlying skin red 2. LOCATION: Where is the rash located?      Left forearm   4. SIZE: How big are the spots? (e.g., inches, cm; or compare to size of pinhead, tip of pen, eraser, pea)      Tip of pen  5. ONSET: When did the rash start?      Monday am  6. ITCHING: Does the rash itch? If Yes, ask: How bad is the itch?  (Scale 0-10; or none, mild, moderate, severe)     Yes moderate  7. PAIN: Does the rash hurt? If Yes, ask: How bad is the pain?  (Scale 0-10; or none, mild, moderate, severe)    Mild to  moderate 8. OTHER SYMPTOMS: Do you have any other symptoms? (e.g., fever)     Sore throat  Protocols used: Sore Throat-A-AH, Rash or Redness - Localized-A-AH

## 2024-03-01 ENCOUNTER — Encounter: Payer: Self-pay | Admitting: Family Medicine

## 2024-03-07 ENCOUNTER — Ambulatory Visit: Payer: MEDICAID | Admitting: Family Medicine

## 2024-03-07 NOTE — Progress Notes (Deleted)
    SUBJECTIVE:   CHIEF COMPLAINT / HPI:   Rash on Forearm s/p travel to [place] ***  PERTINENT  PMH / PSH: ***  OBJECTIVE:   There were no vitals taken for this visit.  General: Awake and Alert in NAD HEENT: NCAT. Sclera anicteric. No rhinorrhea. Cardiovascular: RRR. No M/R/G Respiratory: CTAB, normal WOB on RA. No wheezing, crackles, rhonchi, or diminished breath sounds. Abdomen: Soft, non-tender, non-distended. Bowel sounds normoactive/hypoactive/hyperactive. *** Extremities: Able to move all extremities. No BLE edema, no deformities or significant joint findings. Skin: Warm and dry. No abrasions or rashes noted. Neuro: A&Ox***. No focal neurological deficits.  ASSESSMENT/PLAN:   Assessment & Plan      Olivia Melena, DO [facility]

## 2024-03-30 ENCOUNTER — Encounter (HOSPITAL_COMMUNITY): Payer: Self-pay

## 2024-04-03 ENCOUNTER — Other Ambulatory Visit (HOSPITAL_COMMUNITY): Payer: Self-pay

## 2024-04-03 ENCOUNTER — Ambulatory Visit: Payer: MEDICAID | Admitting: Family Medicine

## 2024-04-03 ENCOUNTER — Ambulatory Visit: Payer: Self-pay | Admitting: Family Medicine

## 2024-04-03 ENCOUNTER — Other Ambulatory Visit (HOSPITAL_COMMUNITY)
Admission: RE | Admit: 2024-04-03 | Discharge: 2024-04-03 | Disposition: A | Discharge status: Home / Self Care | Payer: MEDICAID | Source: Ambulatory Visit | Attending: Family Medicine | Admitting: Family Medicine

## 2024-04-03 VITALS — BP 121/59 | HR 81 | Ht 64.0 in | Wt 168.4 lb

## 2024-04-03 DIAGNOSIS — R197 Diarrhea, unspecified: Secondary | ICD-10-CM | POA: Diagnosis not present

## 2024-04-03 DIAGNOSIS — N898 Other specified noninflammatory disorders of vagina: Secondary | ICD-10-CM | POA: Diagnosis not present

## 2024-04-03 DIAGNOSIS — B9689 Other specified bacterial agents as the cause of diseases classified elsewhere: Secondary | ICD-10-CM

## 2024-04-03 DIAGNOSIS — R10A1 Flank pain, right side: Secondary | ICD-10-CM

## 2024-04-03 DIAGNOSIS — R634 Abnormal weight loss: Secondary | ICD-10-CM

## 2024-04-03 LAB — POCT URINALYSIS DIP (MANUAL ENTRY)
Bilirubin, UA: NEGATIVE
Blood, UA: NEGATIVE
Glucose, UA: NEGATIVE mg/dL
Ketones, POC UA: NEGATIVE mg/dL
Nitrite, UA: NEGATIVE
Protein Ur, POC: NEGATIVE mg/dL
Spec Grav, UA: 1.015
Urobilinogen, UA: 1 U/dL
pH, UA: 7

## 2024-04-03 LAB — POCT UA - MICROSCOPIC ONLY: RBC, Urine, Miroscopic: NONE SEEN

## 2024-04-03 LAB — POCT WET PREP (WET MOUNT)
Clue Cells Wet Prep Whiff POC: POSITIVE
Trichomonas Wet Prep HPF POC: ABSENT

## 2024-04-03 NOTE — Patient Instructions (Signed)
 It was wonderful to see you today! Thank you for choosing [facility].   Please bring ALL of your medications with you to every visit.   Today we talked about:  Will check your urine today and do some vaginal swabs to check for other vaginal infections to see if this could be causing her symptoms.  Given your prior history of kidney stone it looks like this could be likely we may get a renal ultrasound to see if there is any fluid backup in your kidneys.  Given your weight loss and persistent diarrhea I do think we need to get a stool sample and check it for infections.  Please bring that back to the clinic so we can do the testing needed to see if something else is going on.  I had also like to get some lab work whenever you come back since our lab is closed currently to make sure there is not something else going on related to your weight loss.  Please follow up in 2 to 3 weeks if persistent symptoms   We are checking some labs today. If they are abnormal, I will call you. If they are normal, I will send you a MyChart message (if it is active) or a letter in the mail. If you do not hear about your labs in the next 2 weeks, please call the office.  Call the clinic at [phone] if your symptoms worsen or you have any concerns.  Please be sure to schedule follow up at the front desk before you leave today.   Izetta Nap, DO Family Medicine

## 2024-04-03 NOTE — Progress Notes (Signed)
" ° ° °  SUBJECTIVE:   CHIEF COMPLAINT / HPI:   Weight loss, diarrhea Noted since she traveled to [place] in 02/2024.  Reports she has lost at least 10 pounds since that time without trying.  Notes that she has had low appetite, denies dental pain.  Does report daily episodes of diarrhea up to multiple times per day that has not improved since she traveled.  Denies fevers and N/V.  Right flank pain Intermittent pain ongoing x 2 months.  Feels it is worsening over the past 1 to 2 weeks.  Associated with some dysuria and suprapubic pain.  Denies hematuria.  Additionally having some vaginal discharge at times, particular over the past couple weeks.  No known STI exposures.  BTL for contraception.  H/o nephrolithiasis in 2021, had 2 mm obstructive stone with mild hydronephrosis.  Feels pain is not severe as it was during that instance.  Denies fevers and A/P as above.  PERTINENT  PMH / PSH: Nephrolithiasis  OBJECTIVE:   BP (!) 121/59   Pulse 81   Ht 5' 4 (1.626 m)   Wt 168 lb 6.4 oz (76.4 kg)   SpO2 99%   BMI 28.91 kg/m    General: NAD, pleasant, able to participate in exam Cardiac: RRR, no murmurs. Respiratory: CTAB, normal effort, No wheezes, rales or rhonchi Abdomen: Soft, nondistended.  Mild tenderness palpation over suprapubic region.  Mild tenderness over right flank.  Nontender over lumbar spinous process. Pelvic exam: normal external genitalia, vulva, vagina, cervix, uterus and adnexa, VAGINA: normal appearing vagina with normal color and discharge, no lesions, vaginal discharge - white and scant, exam chaperoned by Dayshia, CMA  ASSESSMENT/PLAN:   Assessment & Plan Right flank pain Associated with some dysuria and suprapubic pain, will obtain UA for further evaluation.  Denies systemic infectious symptoms, lower concern for pyelonephritis.  Possible UTI vs nephrolithiasis.  Given history of obstructive nephrolithiasis in 2021, consider renal ultrasound for further assessment and  trial of tamsulosin if persistent pain. -Follow-up UA result Vaginal discharge No known STI exposures, BTL for contraception. -Follow-up G/C -Future orders for HIV, RPR and hep C Diarrhea, unspecified type Weight loss 10+ pound unintentional weight loss since traveling to [place] in 02/2024 associated with daily persistent diarrhea concerning for parasitic infection.  Will order stool studies for further evaluation on lab work to rule out secondary cause such as other infection, thyroid dysfunction and malignancy. - Stool ova and parasite, GI pathogen panel - CBC, TSH, HIV, will obtain when returns for stool sample   Dr. Izetta Nap, DO [facility]     "

## 2024-04-04 ENCOUNTER — Other Ambulatory Visit (HOSPITAL_COMMUNITY): Payer: Self-pay

## 2024-04-04 LAB — CERVICOVAGINAL ANCILLARY ONLY
Chlamydia: NEGATIVE
Comment: NEGATIVE
Comment: NORMAL
Neisseria Gonorrhea: NEGATIVE

## 2024-04-04 MED ORDER — METRONIDAZOLE 0.75 % VA GEL: 1.0000 | Freq: Every day | VAGINAL | 0 refills | Status: AC

## 2024-04-20 ENCOUNTER — Other Ambulatory Visit (HOSPITAL_COMMUNITY): Payer: Self-pay
